# Patient Record
Sex: Female | Born: 1950 | Race: Black or African American | Hispanic: No | Marital: Married | Smoking: Never smoker
Health system: Southern US, Community
[De-identification: ages and names within clinical notes are randomized; demographics above are authoritative.]

## PROBLEM LIST (undated history)

## (undated) DIAGNOSIS — I10 Essential (primary) hypertension: Secondary | ICD-10-CM

## (undated) DIAGNOSIS — E049 Nontoxic goiter, unspecified: Secondary | ICD-10-CM

## (undated) DIAGNOSIS — N6310 Unspecified lump in the right breast, unspecified quadrant: Secondary | ICD-10-CM

## (undated) DIAGNOSIS — N189 Chronic kidney disease, unspecified: Secondary | ICD-10-CM

## (undated) DIAGNOSIS — M1990 Unspecified osteoarthritis, unspecified site: Secondary | ICD-10-CM

## (undated) DIAGNOSIS — H269 Unspecified cataract: Secondary | ICD-10-CM

## (undated) DIAGNOSIS — I639 Cerebral infarction, unspecified: Secondary | ICD-10-CM

## (undated) DIAGNOSIS — E28319 Asymptomatic premature menopause: Secondary | ICD-10-CM

## (undated) DIAGNOSIS — E039 Hypothyroidism, unspecified: Secondary | ICD-10-CM

## (undated) DIAGNOSIS — K219 Gastro-esophageal reflux disease without esophagitis: Secondary | ICD-10-CM

## (undated) DIAGNOSIS — E785 Hyperlipidemia, unspecified: Secondary | ICD-10-CM

## (undated) HISTORY — DX: Chronic kidney disease, unspecified: N18.9

## (undated) HISTORY — PX: OTHER SURGICAL HISTORY: SHX169

## (undated) HISTORY — DX: Hyperlipidemia, unspecified: E78.5

## (undated) HISTORY — DX: Unspecified cataract: H26.9

## (undated) HISTORY — DX: Gastro-esophageal reflux disease without esophagitis: K21.9

## (undated) HISTORY — DX: Asymptomatic premature menopause: E28.319

## (undated) HISTORY — PX: TUBAL LIGATION: SHX77

## (undated) HISTORY — DX: Nontoxic goiter, unspecified: E04.9

## (undated) HISTORY — DX: Essential (primary) hypertension: I10

## (undated) HISTORY — DX: Hypothyroidism, unspecified: E03.9

## (undated) HISTORY — PX: ESOPHAGOGASTRODUODENOSCOPY: SHX1529

## (undated) HISTORY — DX: Cerebral infarction, unspecified: I63.9

## (undated) HISTORY — DX: Unspecified osteoarthritis, unspecified site: M19.90

## (undated) NOTE — *Deleted (*Deleted)
[facility] CONSULT NOTE  Patient Care Team: Renford Dills, MD as PCP - General (Internal Medicine) Drema Dallas, DO as Consulting Physician (Neurology)  CHIEF COMPLAINTS/PURPOSE OF CONSULTATION:  Newly diagnosed high risk for breast cancer  HISTORY OF PRESENTING ILLNESS:  Ann Weiss 34 y.o. female is here because of recent diagnosis of high risk for breast cancer. Screening mammogram on 05/05/19 showed right breast calcifications. Diagnostic mammogram on 05/14/19 showed right breast calcifications, 0.8cm. 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 She underwent a right lumpectomy on 08/27/19 with Dr. Carolynne Edouard for which pathology showed hyalinized intraductal papilloma and usual duct epithelial hyperplasia. She presents to the clinic today for initial evaluation.   I reviewed her records extensively and collaborated the history with the patient.  SUMMARY OF ONCOLOGIC HISTORY: Oncology History   No history exists.    MEDICAL HISTORY:  Past Medical History:  Diagnosis Date  . Breast mass, right   . Constipation   . Diabetes mellitus   . GERD (gastroesophageal reflux disease)   . Goiter   . Hyperlipidemia   . Hypertension   . Hypothyroidism   . Premature menopause age 25  . Stroke (HCC)    sl weakness on right side-uses cane to walk  . Vitamin D deficiency 2009    SURGICAL HISTORY: Past Surgical History:  Procedure Laterality Date  . BREAST LUMPECTOMY WITH RADIOACTIVE SEED LOCALIZATION Right 08/27/2019   Procedure: RIGHT BREAST LUMPECTOMY WITH RADIOACTIVE SEED LOCALIZATION;  Surgeon: Griselda Miner, MD;  Location: [facility];  Service: General;  Laterality: Right;  . CESAREAN SECTION     x 2  . COLONOSCOPY  [id number]  . ESOPHAGOGASTRODUODENOSCOPY  [id number]  . FOOT SURGERY Right 01/2013   bone spur  . TUBAL LIGATION      SOCIAL HISTORY: Social History   Socioeconomic History  . Marital status:  Married    Spouse name: Dorene Sorrow  . Number of children: 2  . Years of education: Not on file  . Highest education level: Bachelor's degree (e.g., BA, AB, BS)  Occupational History  . Occupation: Retired from [employer]: RETIRED  Tobacco Use  . Smoking status: Never Smoker  . Smokeless tobacco: Never Used  Vaping Use  . Vaping Use: Never used  Substance and Sexual Activity  . Alcohol use: Yes    Comment: socially maybe 1-2 times monthly  . Drug use: No  . Sexual activity: Not Currently  Other Topics Concern  . Not on file  Social History Narrative    Daughter lives in [place] and other daughter lives in [place], [place]. 4 grandchildren      Patient is right-handed. She lives with her husband in a two level home. She drinks 2 cups of coffee a day, she does not exercise..   Social Determinants of Health   Financial Resource Strain:   . Difficulty of Paying Living Expenses: Not on file  Food Insecurity:   . Worried About Programme researcher, broadcasting/film/video in the Last Year: Not on file  . Ran Out of Food in the Last Year: Not on file  Transportation Needs:   . Lack of Transportation (Medical): Not on file  . Lack of Transportation (Non-Medical): Not on file  Physical Activity:   . Days of Exercise per Week: Not on file  . Minutes of Exercise per Session: Not on file  Stress:   . Feeling of Stress : Not on file  Social  Connections:   . Frequency of Communication with Friends and Family: Not on file  . Frequency of Social Gatherings with Friends and Family: Not on file  . Attends Religious Services: Not on file  . Active Member of Clubs or Organizations: Not on file  . Attends Banker Meetings: Not on file  . Marital Status: Not on file  Intimate Partner Violence:   . Fear of Current or Ex-Partner: Not on file  . Emotionally Abused: Not on file  . Physically Abused: Not on file  . Sexually Abused: Not on file    FAMILY HISTORY: Family History  Problem Relation Age of Onset  .  Hypertension Mother   . Kidney disease Mother        renal failure, dialysis  . Heart disease Mother   . Hypertension Father   . Stroke Father   . Heart disease Father   . Arthritis Maternal Aunt   . Heart disease Maternal Aunt   . Diabetes Maternal Grandmother   . Cancer Neg Hx   . Breast cancer Neg Hx   . Colon cancer Neg Hx     ALLERGIES:  is allergic to ciprofloxacin.  MEDICATIONS:  Current Outpatient Medications  Medication Sig Dispense Refill  . amLODipine (NORVASC) 10 MG tablet Take 1 tablet (10 mg total) by mouth daily. 30 tablet 5  . aspirin EC 81 MG tablet Take 81 mg by mouth daily.    Marland Kitchen BAYER CONTOUR TEST test strip TEST BLOOD SUGARS 3 TIMES DAILY 100 each 2  . hydrALAZINE (APRESOLINE) 50 MG tablet Take 50 mg by mouth 2 (two) times daily.     Marland Kitchen HYDROcodone-acetaminophen (NORCO/VICODIN) 5-325 MG tablet Take 1-2 tablets by mouth every 6 (six) hours as needed for moderate pain or severe pain. 10 tablet 0  . lactulose (CHRONULAC) 10 GM/15ML solution Take 10 g by mouth 2 (two) times daily.    Marland Kitchen levothyroxine (SYNTHROID, LEVOTHROID) 75 MCG tablet Take 75 mcg by mouth daily.  3  . metFORMIN (GLUCOPHAGE) 1000 MG tablet take 1 tablet by mouth twice a day with food 60 tablet 5  . omeprazole (PRILOSEC) 20 MG capsule take 1 capsule by mouth once daily (Patient taking differently: take 1 capsule by mouth as needed) 30 capsule 11  . rosuvastatin (CRESTOR) 40 MG tablet Take 1 tablet (40 mg total) by mouth daily. 90 tablet 1  . telmisartan-hydrochlorothiazide (MICARDIS HCT) 80-25 MG tablet Take 1 tablet by mouth daily.  6  . vitamin C (ASCORBIC ACID) 250 MG tablet Take 250 mg by mouth daily.     No current facility-administered medications for this visit.    REVIEW OF SYSTEMS:   Constitutional: Denies fevers, chills or abnormal night sweats Eyes: Denies blurriness of vision, double vision or watery eyes Ears, nose, mouth, throat, and face: Denies mucositis or sore throat  Respiratory: Denies cough, dyspnea or wheezes Cardiovascular: Denies palpitation, chest discomfort or lower extremity swelling Gastrointestinal:  Denies nausea, heartburn or change in bowel habits Skin: Denies abnormal skin rashes Lymphatics: Denies new lymphadenopathy or easy bruising Neurological:Denies numbness, tingling or new weaknesses Behavioral/Psych: Mood is stable, no new changes  Breast: Denies any palpable lumps or discharge All other systems were reviewed with the patient and are negative.  PHYSICAL EXAMINATION: ECOG PERFORMANCE STATUS: {CHL ONC ECOG [phone]}  There were no vitals filed for this visit. There were no vitals filed for this visit.  GENERAL:alert, no distress and comfortable SKIN: skin color, texture, turgor are normal, no  rashes or significant lesions EYES: normal, conjunctiva are pink and non-injected, sclera clear OROPHARYNX:no exudate, no erythema and lips, buccal mucosa, and tongue normal  NECK: supple, thyroid normal size, non-tender, without nodularity LYMPH:  no palpable lymphadenopathy in the cervical, axillary or inguinal LUNGS: clear to auscultation and percussion with normal breathing effort HEART: regular rate & rhythm and no murmurs and no lower extremity edema ABDOMEN:abdomen soft, non-tender and normal bowel sounds Musculoskeletal:no cyanosis of digits and no clubbing  PSYCH: alert & oriented x 3 with fluent speech NEURO: no focal motor/sensory deficits BREAST:*** No palpable nodules in breast. No palpable axillary or supraclavicular lymphadenopathy (exam performed in the presence of a chaperone)   LABORATORY DATA:  I have reviewed the data as listed Lab Results  Component Value Date   WBC 9.2 04/01/2019   HGB 13.4 04/01/2019   HCT 41.7 04/01/2019   MCV 91.4 04/01/2019   PLT 198 04/01/2019   Lab Results  Component Value Date   NA 138 08/24/2019   K 3.9 08/24/2019   CL 101 08/24/2019   CO2 27 08/24/2019    RADIOGRAPHIC  STUDIES: I have personally reviewed the radiological reports and agreed with the findings in the report.  ASSESSMENT AND PLAN:  No problem-specific Assessment & Plan notes found for this encounter.   All questions were answered. The patient knows to call the clinic with any problems, questions or concerns.   Sabas Sous, MD, MPH 10/17/2019    I, Molly Dorshimer, am acting as scribe for Serena Croissant, MD.  {Add scribe attestation statement}

---

## 1997-06-29 ENCOUNTER — Ambulatory Visit (HOSPITAL_COMMUNITY): Admission: RE | Admit: 1997-06-29 | Discharge: 1997-06-29 | Payer: Self-pay | Admitting: Endocrinology

## 1998-03-17 ENCOUNTER — Ambulatory Visit (HOSPITAL_COMMUNITY): Admission: RE | Admit: 1998-03-17 | Discharge: 1998-03-17 | Payer: Self-pay | Admitting: *Deleted

## 1998-11-15 ENCOUNTER — Encounter: Payer: Self-pay | Admitting: Endocrinology

## 1998-11-15 ENCOUNTER — Ambulatory Visit (HOSPITAL_COMMUNITY): Admission: RE | Admit: 1998-11-15 | Discharge: 1998-11-15 | Payer: Self-pay | Admitting: Endocrinology

## 1999-05-10 ENCOUNTER — Encounter: Admission: RE | Admit: 1999-05-10 | Discharge: 1999-05-10 | Payer: Self-pay | Admitting: Endocrinology

## 1999-05-10 ENCOUNTER — Encounter: Payer: Self-pay | Admitting: Endocrinology

## 1999-11-12 ENCOUNTER — Inpatient Hospital Stay (HOSPITAL_COMMUNITY): Admission: AD | Admit: 1999-11-12 | Discharge: 1999-11-12 | Payer: Self-pay | Admitting: Obstetrics and Gynecology

## 2000-10-24 ENCOUNTER — Encounter: Admission: RE | Admit: 2000-10-24 | Discharge: 2000-10-24 | Payer: Self-pay | Admitting: Endocrinology

## 2000-10-24 ENCOUNTER — Encounter: Payer: Self-pay | Admitting: Endocrinology

## 2000-11-03 ENCOUNTER — Other Ambulatory Visit: Admission: RE | Admit: 2000-11-03 | Discharge: 2000-11-03 | Payer: Self-pay | Admitting: *Deleted

## 2001-08-04 ENCOUNTER — Encounter: Payer: Self-pay | Admitting: Endocrinology

## 2001-08-04 ENCOUNTER — Encounter: Admission: RE | Admit: 2001-08-04 | Discharge: 2001-08-04 | Payer: Self-pay | Admitting: Endocrinology

## 2001-09-08 ENCOUNTER — Ambulatory Visit (HOSPITAL_COMMUNITY): Admission: RE | Admit: 2001-09-08 | Discharge: 2001-09-08 | Payer: Self-pay | Admitting: Neurology

## 2001-09-18 ENCOUNTER — Ambulatory Visit (HOSPITAL_COMMUNITY): Admission: RE | Admit: 2001-09-18 | Discharge: 2001-09-18 | Payer: Self-pay | Admitting: Neurology

## 2001-09-18 ENCOUNTER — Encounter: Payer: Self-pay | Admitting: Neurology

## 2002-01-13 ENCOUNTER — Other Ambulatory Visit: Admission: RE | Admit: 2002-01-13 | Discharge: 2002-01-13 | Payer: Self-pay | Admitting: *Deleted

## 2002-02-09 ENCOUNTER — Encounter: Payer: Self-pay | Admitting: Neurology

## 2002-02-09 ENCOUNTER — Ambulatory Visit (HOSPITAL_COMMUNITY): Admission: RE | Admit: 2002-02-09 | Discharge: 2002-02-09 | Payer: Self-pay | Admitting: Neurology

## 2002-02-09 IMAGING — MR MR HEAD WO/W CM
6 of 11 series · 23 of 48 positions shown · IV contrast (agent unspecified)
Comparison: none

FINDINGS
CLINICAL DATA: RIGHT HEMIPARESIS.  PREVIOUSLY IDENTIFIED LEFT POSTERIOR PARASAGITTAL BRAIN LESION.
MR BRAIN WITHOUT AND WITH CONTRAST
MULTIPLANAR T1 AND T2 WEIGHTED IMAGES WERE OBTAINED WITH AND WITHOUT CONTRAST.  SAGITTAL T1
WEIGHTED IMAGES ARE UNREMARKABLE.  AXIAL T2 WEIGHTED IMAGES DEMONSTRATE NORMAL VENTRICLES, CISTERNS
AND SULCI.  T2 AND FLAIR IMAGES DEMONSTRATE A 1.0 X 1.5 X 1.0 CM LESION IN THE LEFT PARIETAL
PARASAGITTAL CORTEX INVOLVING THE LEFT FORCEPS MAJOR.  OTHER MORE SUBTLE PERIVENTRICULAR WHITE
MATTER LESIONS ARE SEEN BILATERALLY IN THE OPTIC RADIATIONS ALONG THE OCCIPITAL HORNS.  T1 WEIGHTED
IMAGES ARE UNREMARKABLE.  SIGNIFICANT MAXILLARY AND ETHMOID SINUSITIS IS INCIDENTALLY NOTED WITH
MUCOSAL THICKENING IN BOTH FRONTAL SINUSES.  DIFFUSION WEIGHTED IMAGES SHOW ABNORMAL SIGNAL,
CORRESPONDING TO THE LESION.  ON DIFFUSION IMAGES, THIS LARGE AREA OF HYPERINTENSITY ON FLAIR
IMAGES APPEARS AS SEVERAL SMALLER FOCI OF POSITIVITY.  FOLLOWING ADMINISTRATION OF CONTRAST, THERE
IS NO ABNORMAL ENHANCEMENT.
WHEN COMPARED WITH PREVIOUS FLAIR IMAGES FROM 18 September, 2001, OVERALL THE LESION HAS DECREASED
SLIGHTLY IN SIZE.  I DO NOT HAVE OTHER IMAGES SUCH AS DIFFUSION OR POST CONTRAST IMAGES FOR
COMPARISON.
CONSIDERING THE INTERVAL IMPROVEMENT, I BELIEVE THE LESION IS MOST LIKELY TO REPRESENT AN ACTIVE MS
PLAQUE.  GIVEN THE PREVIOUSLY NORMAL SPECTROSCOPY, IT IS UNLIKELY THAT A BENIGN NEOPLASM WOULD SHOW
INTERVAL CHANGE IN 3 MONTHS IN REGARD TO IMPROVEMENT.  THE FACT THAT THE DIFFUSION IMAGES ARE
POSITIVE IN AN AREA CORRESPONDING TO THE LESION WOULD INDICATE THAT THIS DOES NOT REPRESENT A
STROKE, BUT IT IS KNOWN THAT ACTIVE MS LESIONS WILL SHOW INCREASED ACTIVITY ON DIFFUSION.
IMPRESSION
1.  OVERALL IMPROVEMENT IN LEFT PARIETAL PARASAGITTAL LESION INVOLVING THE LEFT FORCEPS MAJOR OF
THE CORPUS CALLOSUM NOW MEASURING 1.0 X 1.0 X 1.5 CM.
2.  THE LESION SHOWS FOCAL AREAS OF POSITIVITY ON DIFFUSION WEIGHTED IMAGES, I AM SUSPICIOUS THAT
THIS REPRESENTS AN ACUTE MS PLAQUE.
3.  MILD INCREASED SIGNAL IN THE PERIVENTRICULAR WHITE MATTER ALONG THE OPTIC RADIATIONS ALSO
SUGGESTS MS PAWLO BE THE ETIOLOGY.
MR SPECTROSCOPY
A PROBE P TECHNIQUE WAS USED WITH A LONG TR AND A SHORT TE.  A 9 VOXEL GRID WAS PLACED OVER THE
LEFT HEMISPHERE TO INCLUDE AREAS OF NORMAL TISSUE AS WELL AS THE LESION.  FURTHERMORE, A SEPARATE 2
CM VOXEL WAS PLACED TO INCLUDE THE LESION IN ITS EPICENTER.  THE SPECTRUM FROM NORMAL BRAIN AND
FROM THE LESION ARE VIRTUALLY IDENTICAL.  I DO NOT FEEL THAT THERE IS ANY DESTRUCTION OF LORRAINE OR ANY
INCREASE IN CHOLINE REFERRABLE TO THE LESION.  WHEN COMPARED TO PREVIOUS SPECTRUM [DATE], THERE IS LITTLE CHANGE.
SPECTRUM FROM THE LEFT PARIETAL PARASAGITTAL LESION IS WITHIN NORMAL LIMITS AND UNCHANGED FROM
PREVIOUS STUDY.

[Series 2: T1 · sagittal · 5.0mm · 0.43mm/px · 1 of 12 slices shown]
[im 1/12]
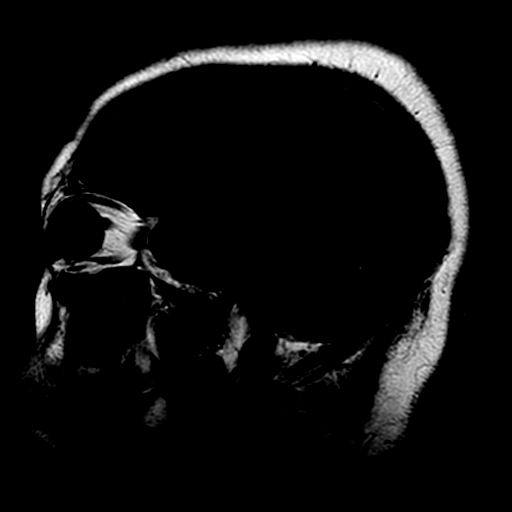

[Series 3: DWI · axial · 5.0mm · 1.25mm/px · z∈[-45,+95]mm · 8 of 42 slices shown]
[im 1/42]
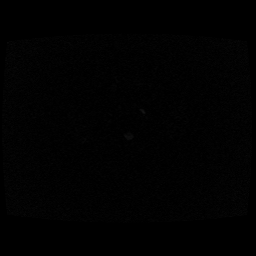
[im 6/42]
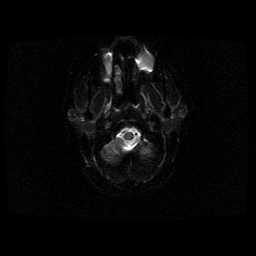
[im 12/42]
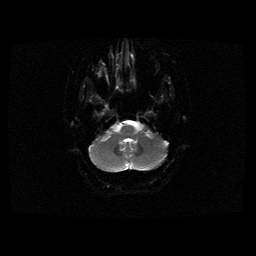
[im 18/42]
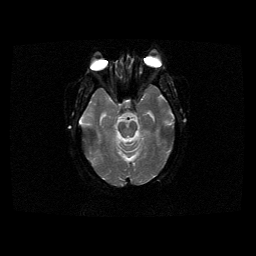
[im 24/42]
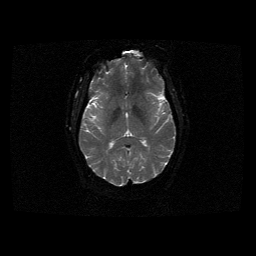
[im 30/42]
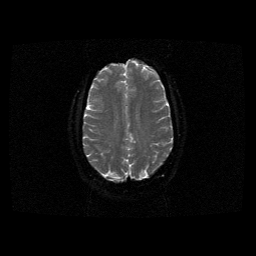
[im 36/42]
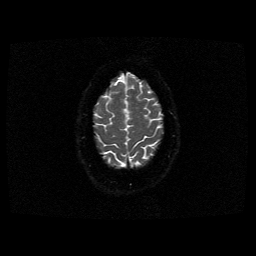
[im 42/42]
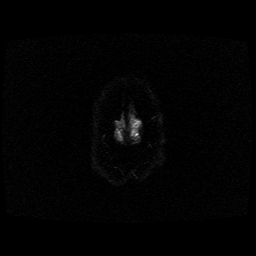

[Series 4: T2 · axial · 5.0mm · 0.43mm/px · z∈[-40,+93]mm · 4 of 20 slices shown (1 of 2)]
[im 1/20]
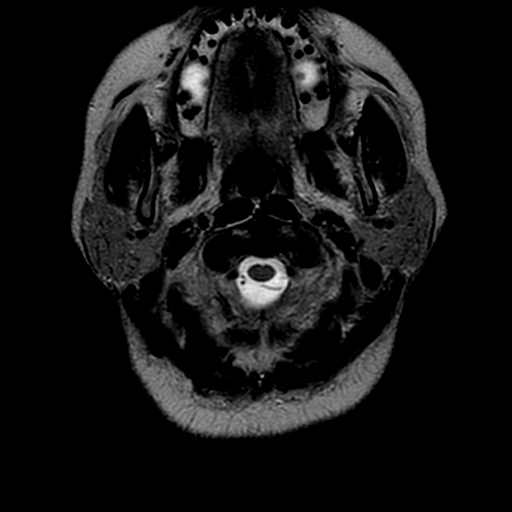
[im 7/20]
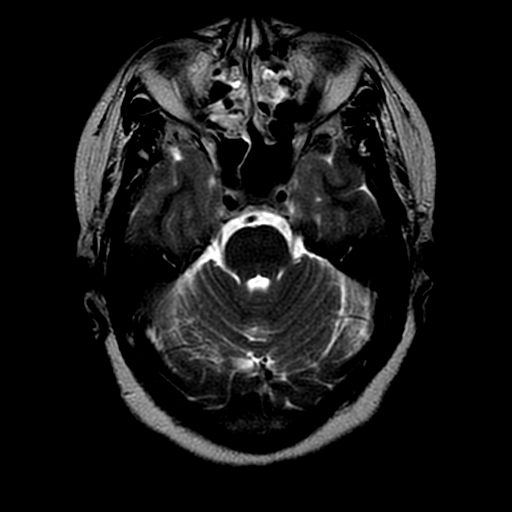
[im 13/20]
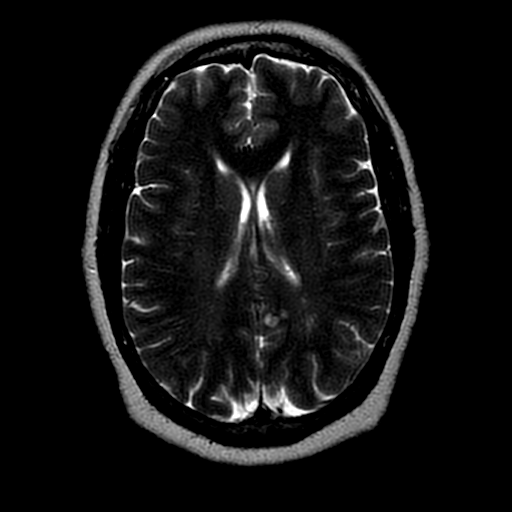
[im 20/20]
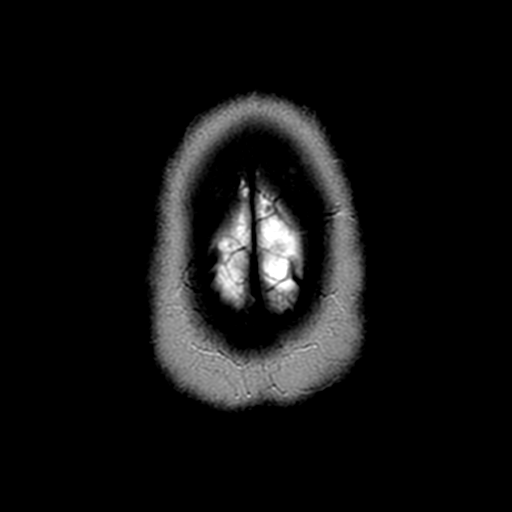

[Series 5: FLAIR · axial · 5.0mm · 0.43mm/px · z∈[-40,+93]mm · 4 of 20 slices shown]
[im 1/20]
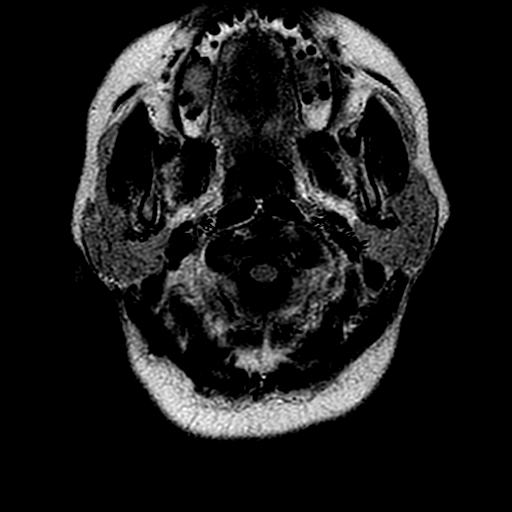
[im 7/20]
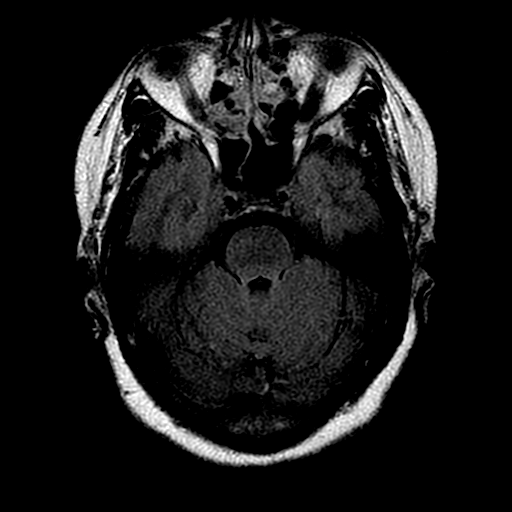
[im 13/20]
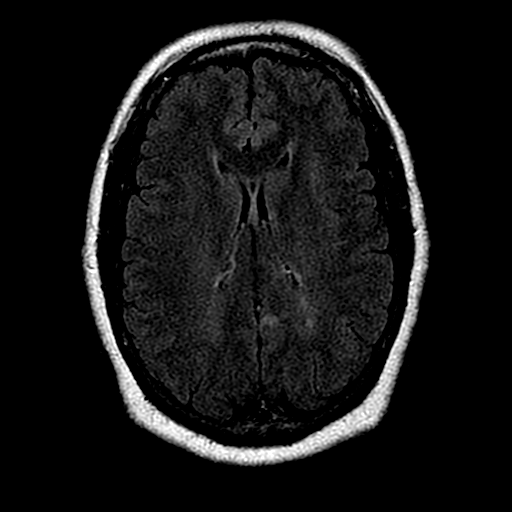
[im 20/20]
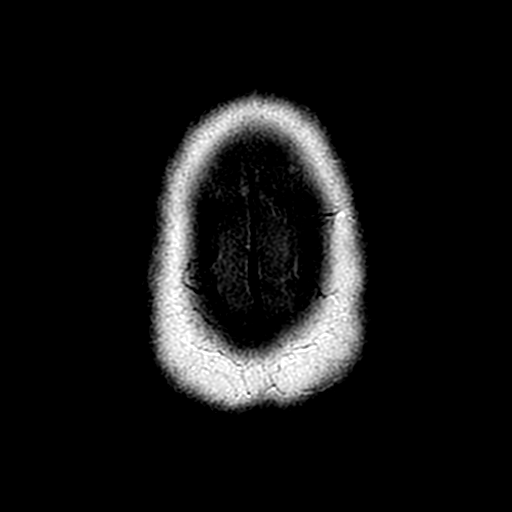

[Series 7: T2 · coronal · 5.0mm · 0.43mm/px · 4 of 23 slices shown (2 of 2)]
[im 1/23]
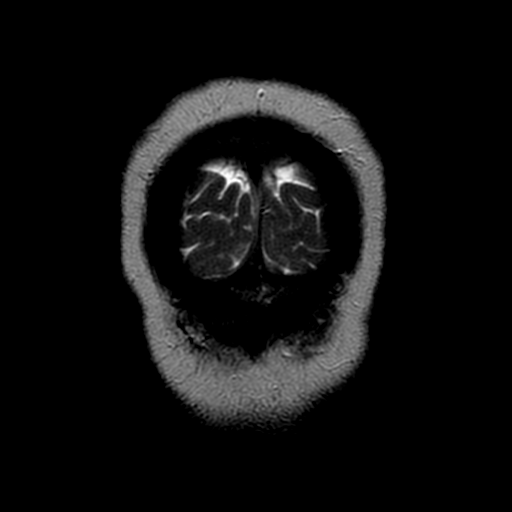
[im 8/23]
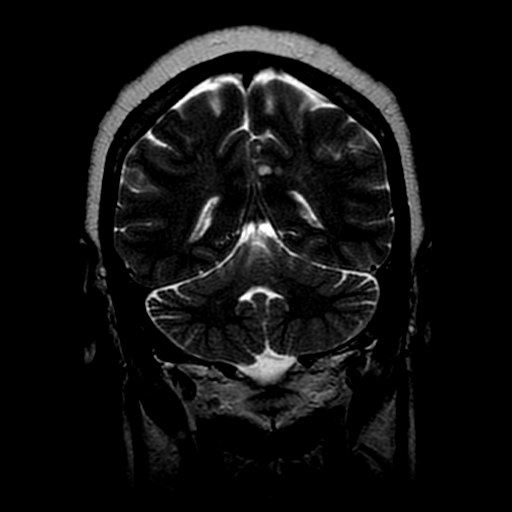
[im 15/23]
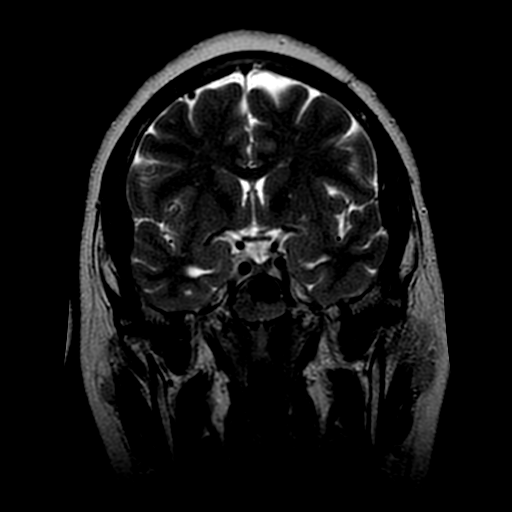
[im 23/23]
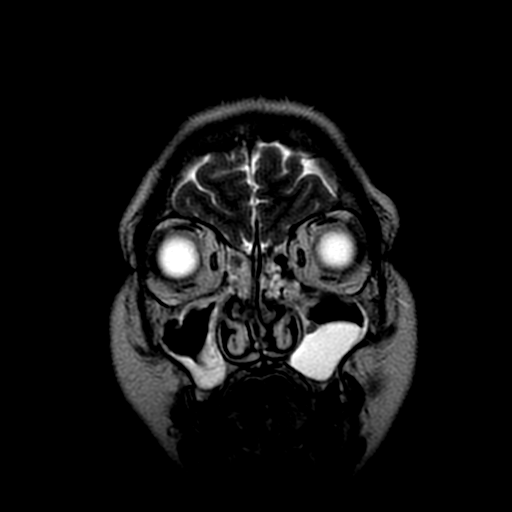

[Series 14: T1 post-contrast · sagittal · 3.0mm · 0.43mm/px · 2 of 12 slices shown]
[im 1/12]
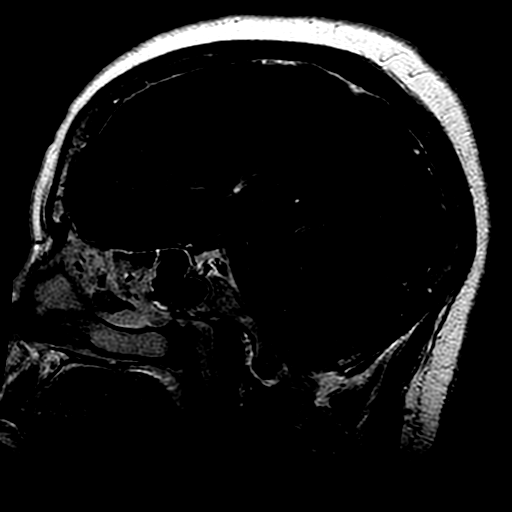
[im 12/12]
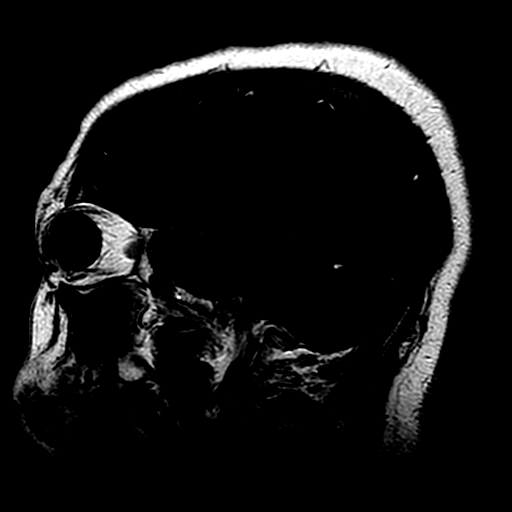

[23 of 48 positions shown; findings below may reference images not displayed]

## 2002-03-23 ENCOUNTER — Encounter: Payer: Self-pay | Admitting: Endocrinology

## 2002-03-23 ENCOUNTER — Ambulatory Visit (HOSPITAL_COMMUNITY): Admission: RE | Admit: 2002-03-23 | Discharge: 2002-03-23 | Payer: Self-pay | Admitting: Endocrinology

## 2002-06-15 ENCOUNTER — Encounter: Payer: Self-pay | Admitting: Endocrinology

## 2002-06-15 ENCOUNTER — Encounter: Admission: RE | Admit: 2002-06-15 | Discharge: 2002-06-15 | Payer: Self-pay | Admitting: Endocrinology

## 2003-03-22 ENCOUNTER — Other Ambulatory Visit: Admission: RE | Admit: 2003-03-22 | Discharge: 2003-03-22 | Payer: Self-pay | Admitting: *Deleted

## 2003-03-26 ENCOUNTER — Emergency Department (HOSPITAL_COMMUNITY): Admission: EM | Admit: 2003-03-26 | Discharge: 2003-03-26 | Payer: Self-pay | Admitting: Emergency Medicine

## 2003-03-26 IMAGING — CR DG CHEST 2V
2 series · 2 of 2 positions shown · non-contrast
Comparison: none

CLINICAL DATA: Pressure in chest.
 CHEST TWO VIEW
 The heart size and mediastinal contours are unremarkable.  The lungs are clear.  The visualized skeleton is unremarkable.
 IMPRESSION
 No active disease.

[view not recorded (1 of 2)]
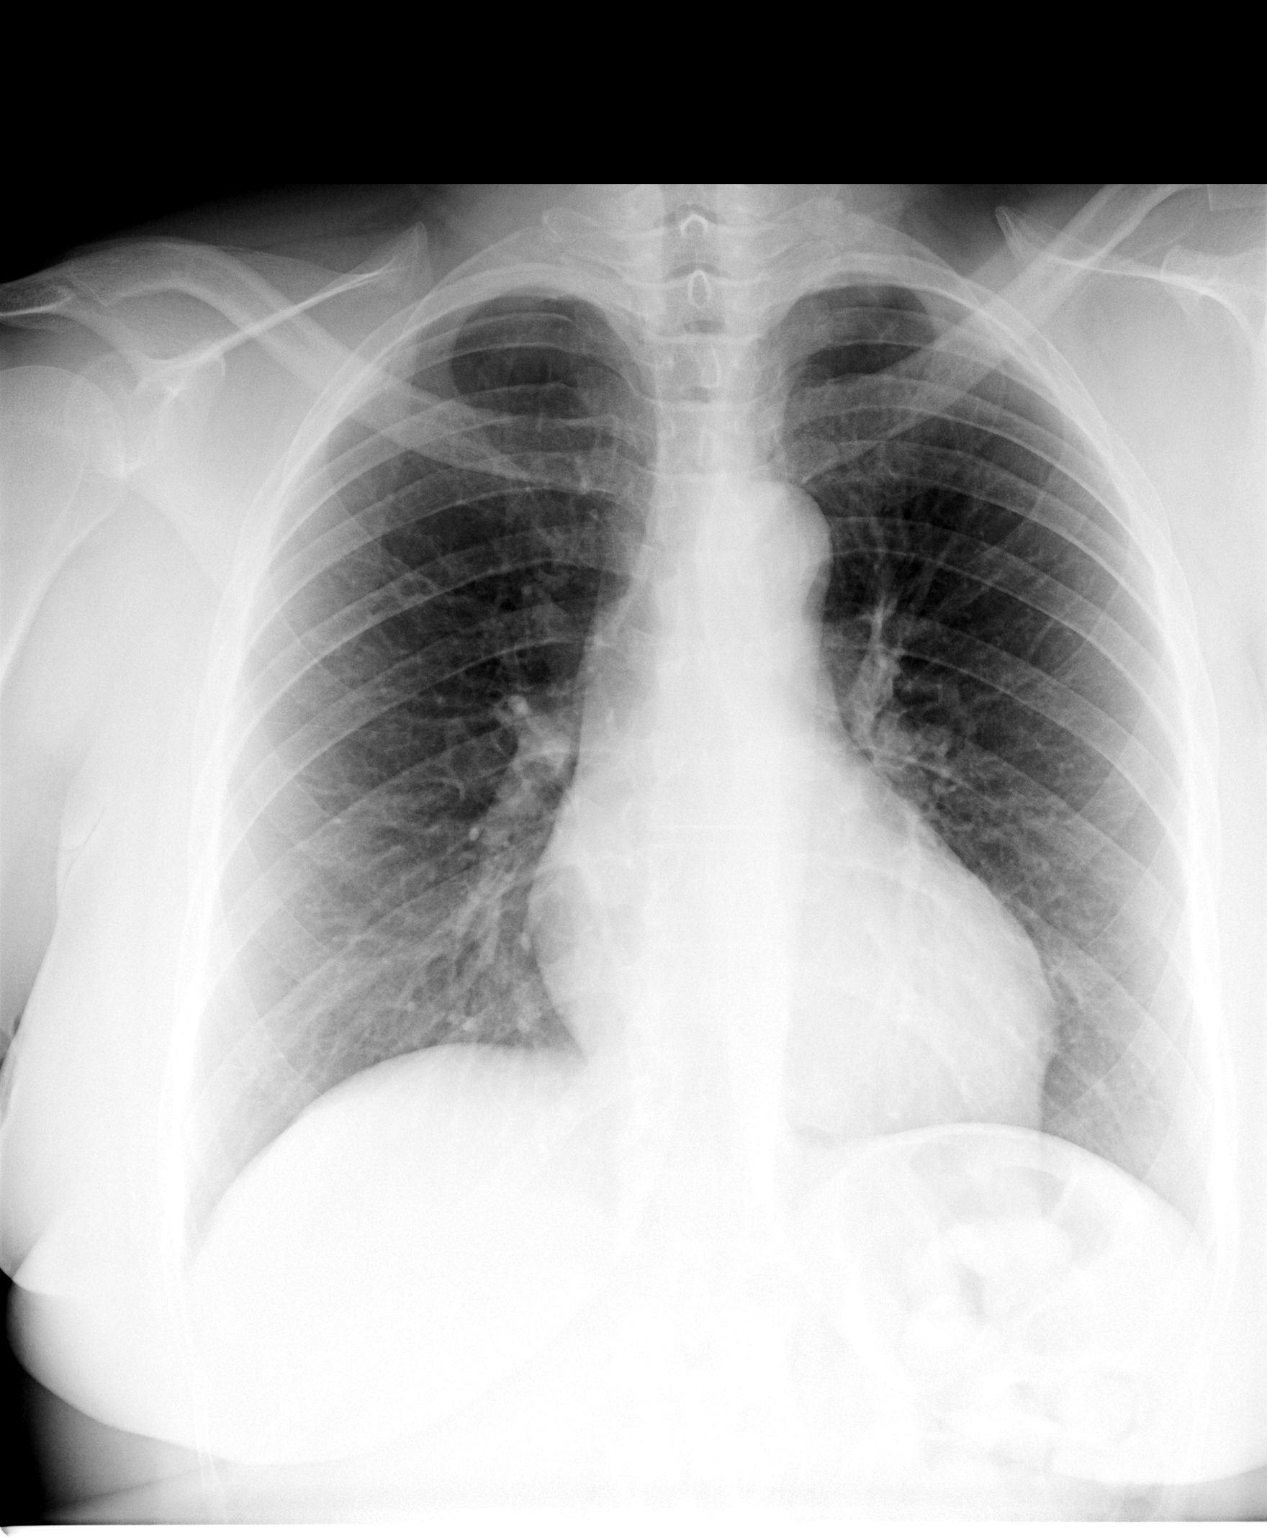

[view not recorded (2 of 2)]
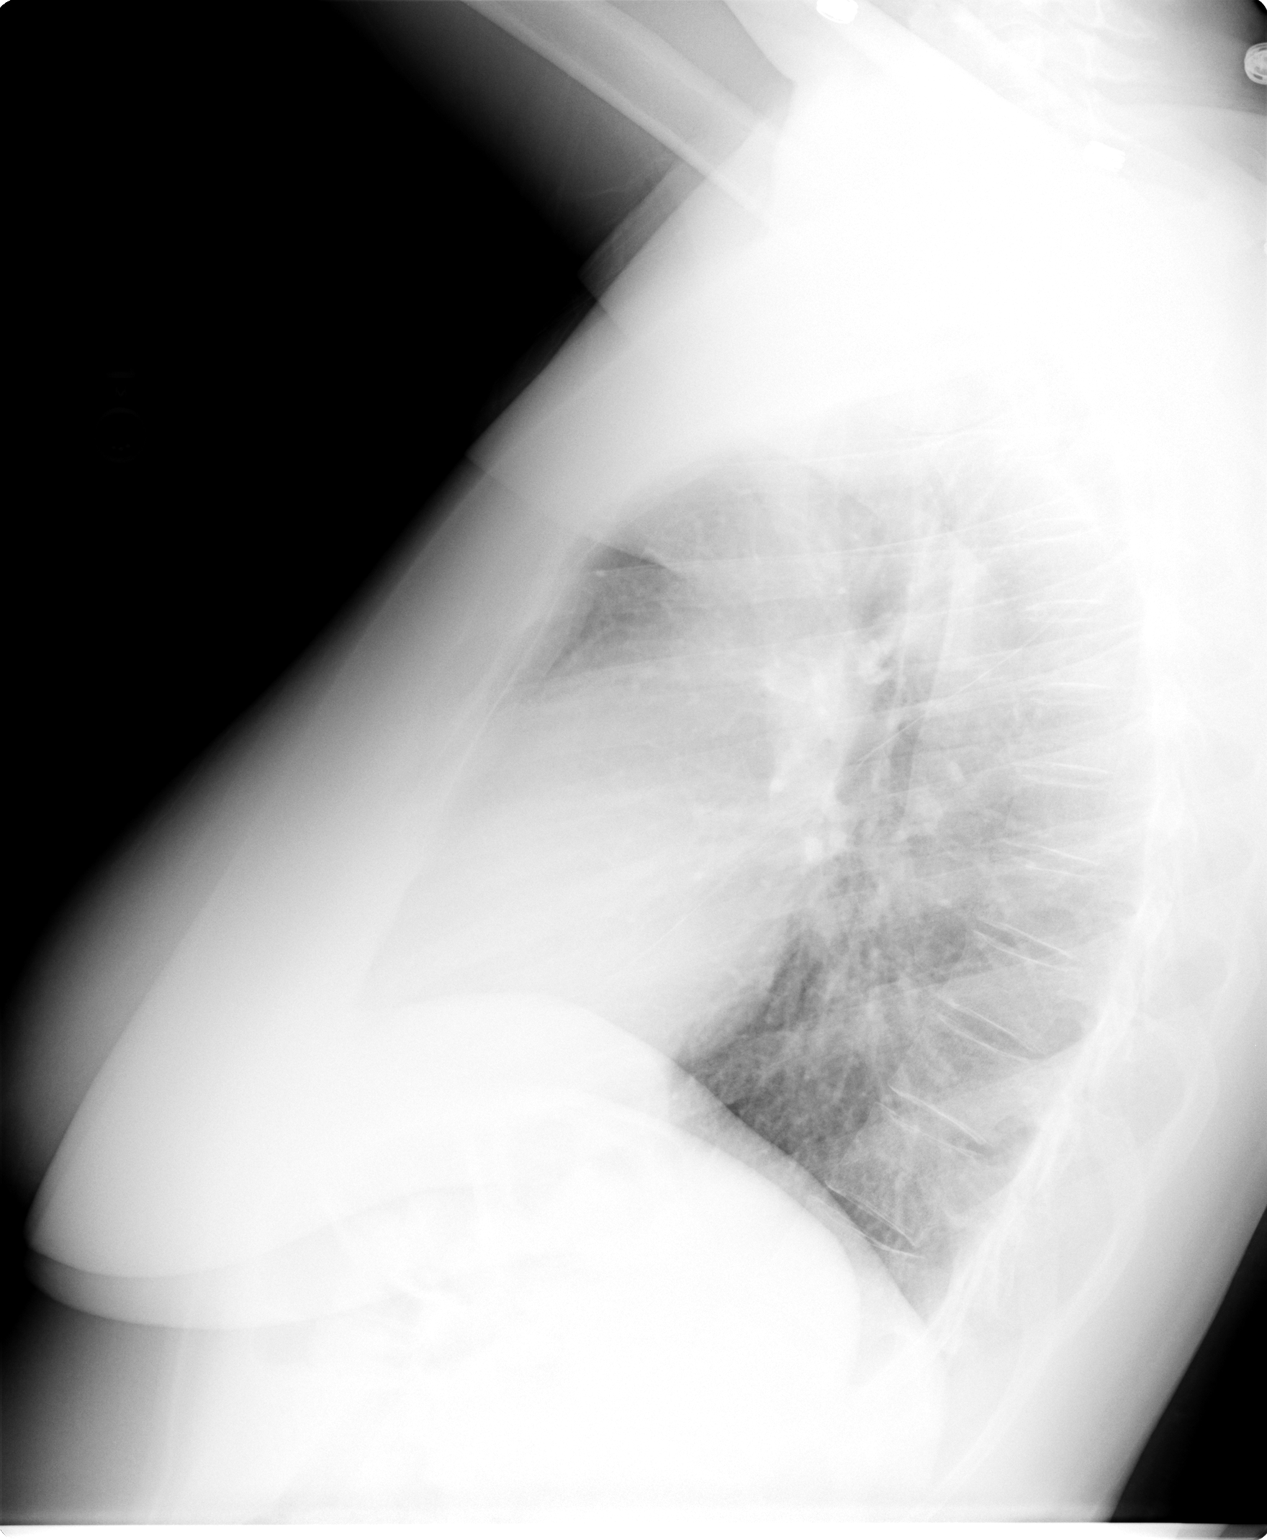

[2 of 2 positions shown; findings below may reference images not displayed]

## 2003-04-19 ENCOUNTER — Ambulatory Visit (HOSPITAL_COMMUNITY): Admission: RE | Admit: 2003-04-19 | Discharge: 2003-04-19 | Payer: Self-pay | Admitting: Cardiology

## 2003-04-19 IMAGING — NM NM MYOCAR SINGLE W/ SPECT
1 series · 6 of 6 positions shown · non-contrast
Comparison: none

CLINICAL DATA: Chest pain
 NUCLEAR MEDICINE MYOCARDIAL PERFUSION IMAGING WITH SPECT

[Series 1: cr cardiolite low dose · 6.79mm/px · 6 of 64 frames shown]
[frame 6/64]
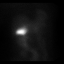
[frame 16/64]
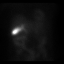
[frame 27/64]
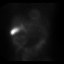
[frame 38/64]
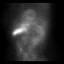
[frame 48/64]
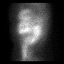
[frame 59/64]
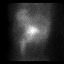

[6 of 6 positions shown; findings below may reference images not displayed]

FINDINGS: After the intravenous injection of 10 millicuries technetium 99m sestamibi, myocardial perfusion rest images were obtained.  The patient refused stress imaging.  Apparently the patient was claustrophobic.
 Myocardial perfusion imaging with SPECT at rest. A perfusion defect is present at the apex of the heart.  No other perfusion defects are seen. The images were non-gated.
 IMPRESSION 
 Rest perfusion images only demonstrating a small perfusion defect at the cardiac apex.

## 2003-07-12 ENCOUNTER — Ambulatory Visit (HOSPITAL_COMMUNITY): Admission: RE | Admit: 2003-07-12 | Discharge: 2003-07-12 | Payer: Self-pay | Admitting: Neurology

## 2003-07-26 ENCOUNTER — Ambulatory Visit (HOSPITAL_COMMUNITY): Admission: RE | Admit: 2003-07-26 | Discharge: 2003-07-26 | Payer: Self-pay | Admitting: Neurology

## 2003-07-26 IMAGING — MR MR HEAD WO/W CM
6 of 11 series · 24 of 48 positions shown · IV contrast (gadolinium)
Comparison: none

CLINICAL DATA: Patient with right-sided hemiparesis with left posterior parietal parasagittal lesions. 
MRI SCAN OF THE BRAIN PRE- AND POST-INFUSION OF CONTRAST 
Multiecho, multiplanar images were obtained before and after the infusion of 20 cc of gadolinium intravenously.  
The study is read in conjunction with the previous MRI scan of the brain of 02/09/02. 
The gray white matter differentiation remains normal for the patient?s age.  The sella and the craniovertebral junction are within normal limits as well.
The odontoid process and the predental space are normal.  There is mild prominence of the soft tissues of the nasopharynx, probably related to the patient?s age. 
The diffusion-weighted images demonstrate three focal areas of hyperintensity in the deep left parieto-occipital region just posterior to the posterior aspect of the splenium of the corpus callosum and the region of the forceps major.  
The axial FLAIR and axial T2-weighted images correspond with hyperintense signal in these areas of abnormality in the deep left parieto-occipital subcortical white matter posterior to the splenium of the corpus callosum.  Post-infusion images again demonstrate no evidence of pathological enhancement. 
On the sagittal FLAIR sequences, three oval lesions are seen, the largest of this anteriorly measuring 14.8 mm x 10.3 mm, and the others measuring 8 mm x 7 mm and the smaller one in the middle measuring approximately 5 mm x 5 mm. Additionally demonstrated are ill defined areas of increased signal in the regions of the optic radiations bilaterally, in the trigonal white matter regions, and also in the subcortically white matter bifrontally in the parasagittal axis.  None of these areas demonstrate incident.
Flow voids are maintained in the major vessels of the cranial skull base.  The mastoid air cells are normally developed and aerated.  
Again demonstrated is moderate sinusitis changes involving the ethmoid air cells.  There is moderate thickening of the mucosa in the maxillary sinuses with suggestion of possible mucous retention cyst in the left maxillary sinus and a small one in the right maxillary sinus. 
Post-infusion images demonstrate the dural venous sinuses to be patent. 
IMPRESSION
Three well defined areas of hyperintensity involving the subcortical white matter including  the left forceps major of the corpus callosum.  None of these show enhancement.  Compared to the previous study, with slight differences in technique, the lesion morphology and size remain stable. 
Continued presence of more ill defined areas of abnormal hyperintense signal involving the bifrontal subcortical white matter in the parasagittal axis, and also the optic radiations.  These combined with the lesions described, would lend suspicion to these being related to a demyelinating illness which is possibly inactive at the present time. 
Sinusitis-type changes involving the ethmoids, the maxillary sinuses, and to a lesser degree the sphenoid sinuses, slightly improved. 
Probable mucous retention cyst in both maxillary sinuses as described above.

[Series 3: DWI · axial · 5.0mm · 1.41mm/px · z∈[-41,+95]mm · 9 of 52 slices shown]
[im 1/52]
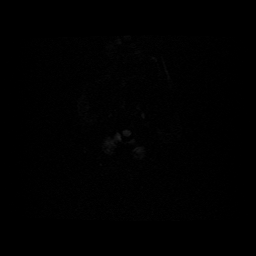
[im 7/52]
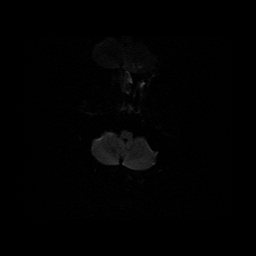
[im 13/52]
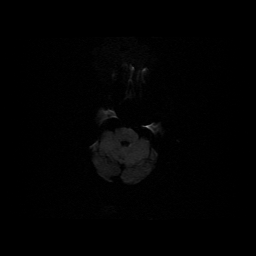
[im 20/52]
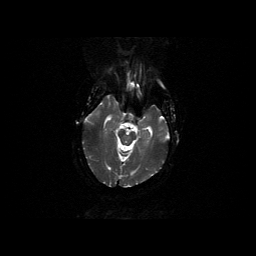
[im 26/52]
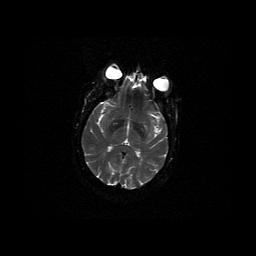
[im 32/52]
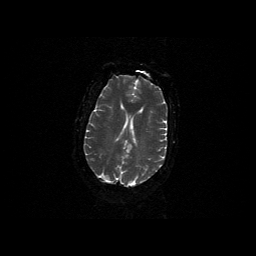
[im 39/52]
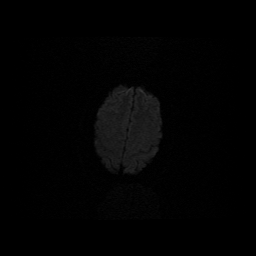
[im 45/52]
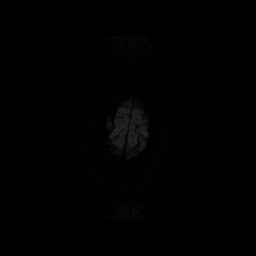
[im 52/52]
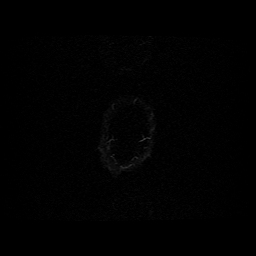

[Series 4: T2 · axial · 5.0mm · 0.43mm/px · z∈[-48,+91]mm · 3 of 21 slices shown (1 of 2)]
[im 1/21]
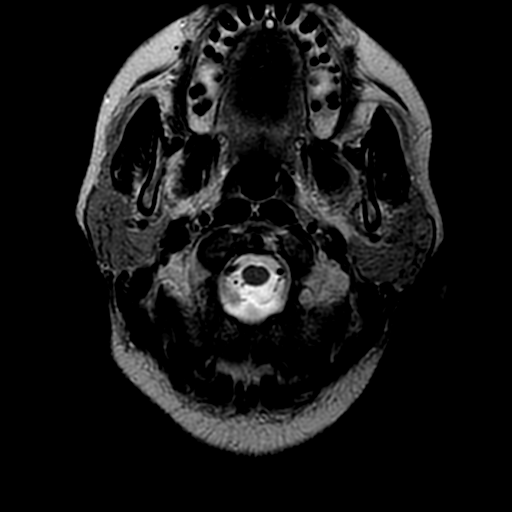
[im 11/21]
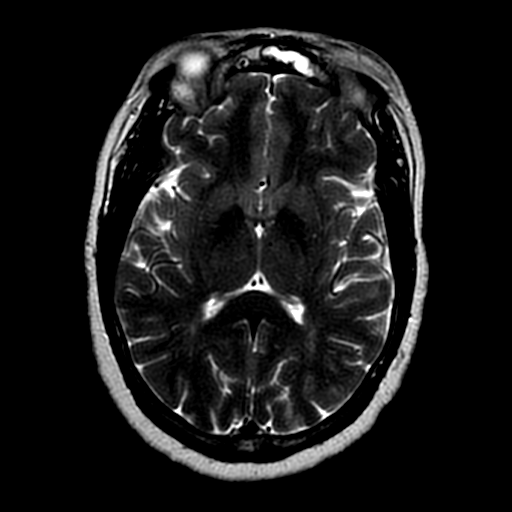
[im 21/21]
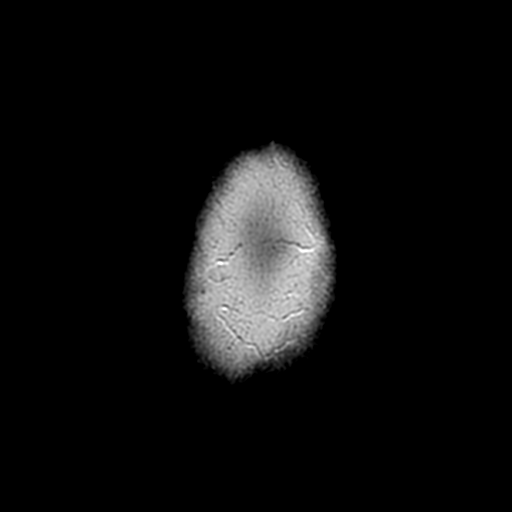

[Series 5: FLAIR · axial · 5.0mm · 0.43mm/px · z∈[-48,+91]mm · 3 of 21 slices shown (1 of 2)]
[im 1/21]
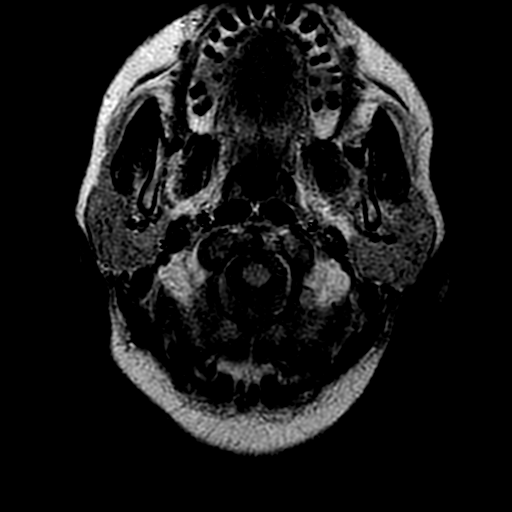
[im 11/21]
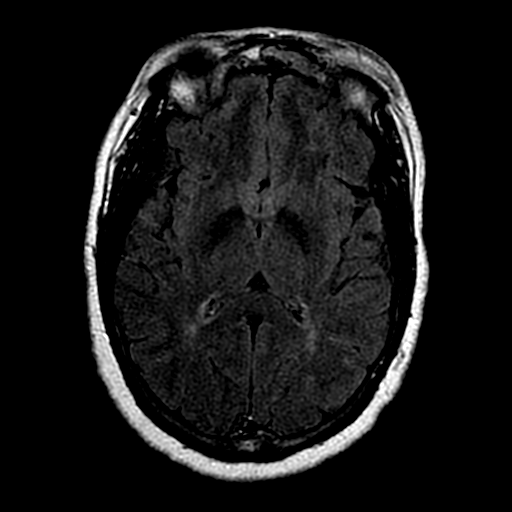
[im 21/21]
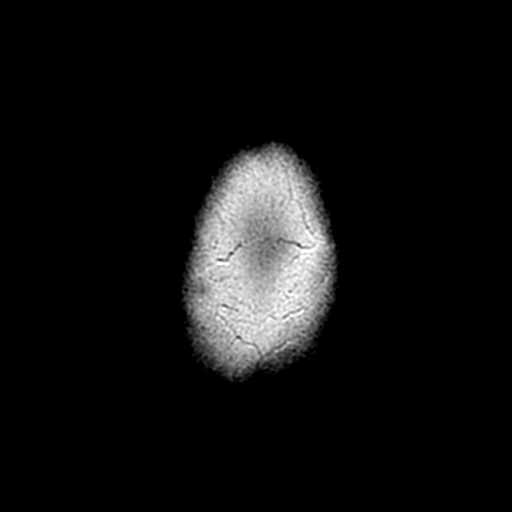

[Series 6: FLAIR · sagittal · 5.0mm · 0.43mm/px · 3 of 19 slices shown (2 of 2)]
[im 1/19]
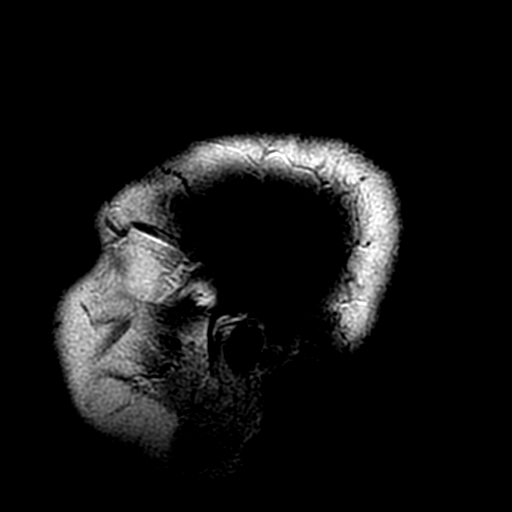
[im 10/19]
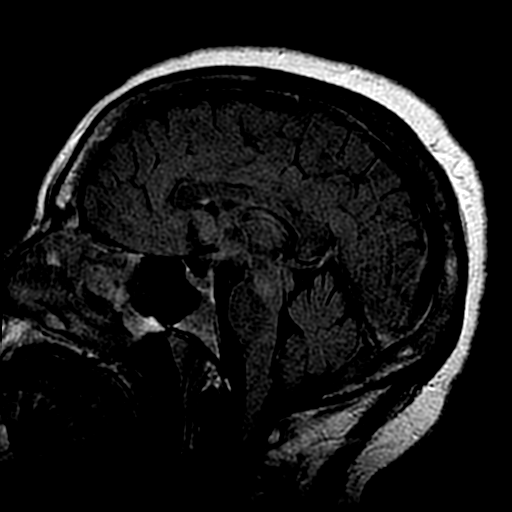
[im 19/19]
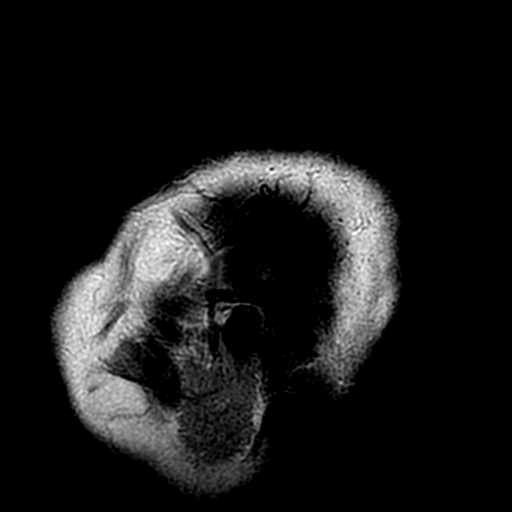

[Series 8: T2 · coronal · 5.0mm · 0.43mm/px · 3 of 20 slices shown (2 of 2)]
[im 1/20]
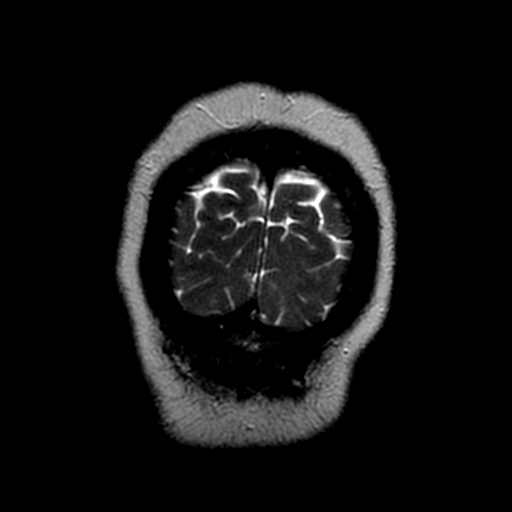
[im 10/20]
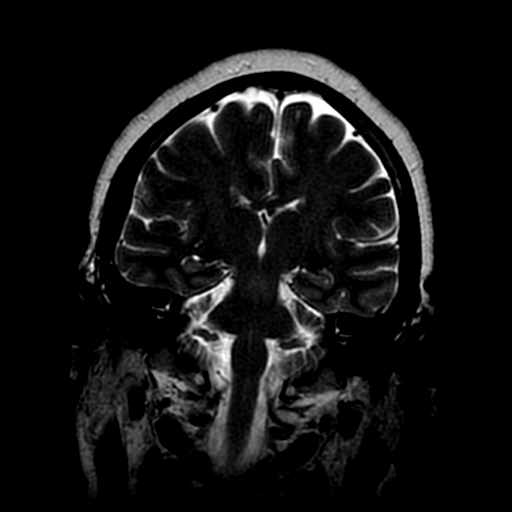
[im 20/20]
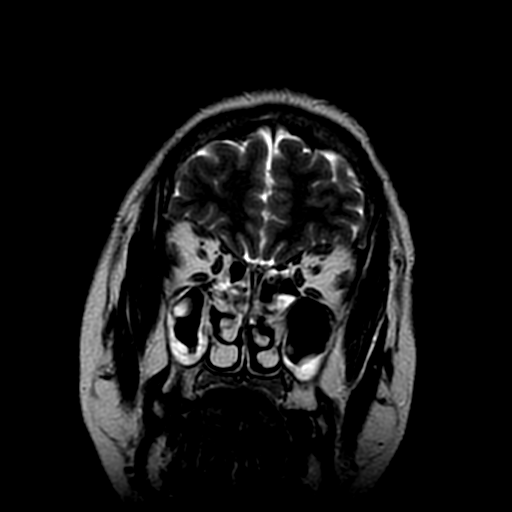

[Series 11: T1 post-contrast · sagittal · 5.0mm · 0.43mm/px · 3 of 21 slices shown]
[im 1/21]
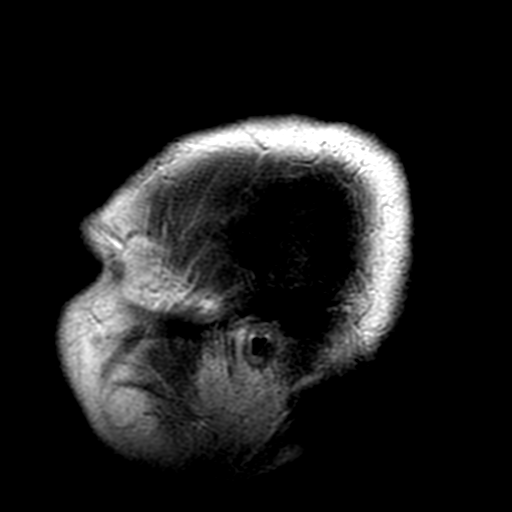
[im 11/21]
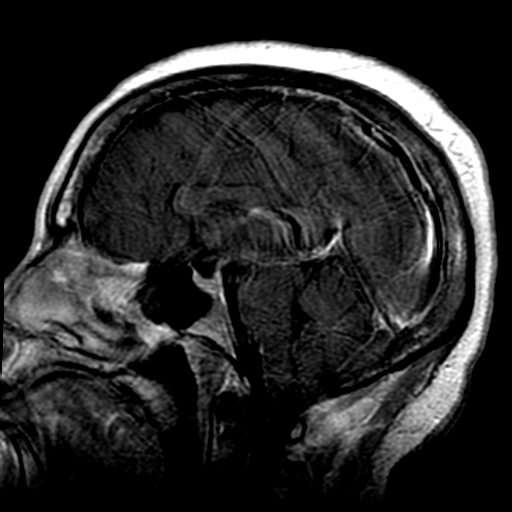
[im 21/21]
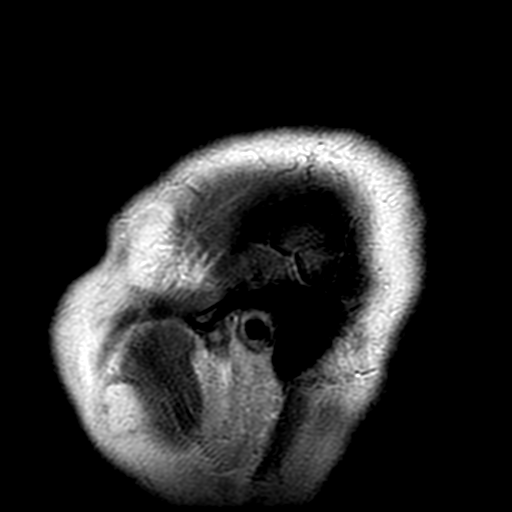

[24 of 48 positions shown; findings below may reference images not displayed]

## 2004-04-17 ENCOUNTER — Other Ambulatory Visit: Admission: RE | Admit: 2004-04-17 | Discharge: 2004-04-17 | Payer: Self-pay | Admitting: Obstetrics and Gynecology

## 2004-08-21 ENCOUNTER — Ambulatory Visit (HOSPITAL_COMMUNITY): Admission: RE | Admit: 2004-08-21 | Discharge: 2004-08-21 | Payer: Self-pay | Admitting: Endocrinology

## 2004-08-21 IMAGING — US US SOFT TISSUE HEAD/NECK
1 series · 14 of 25 positions shown · non-contrast
Comparison: 03/23/2002.

CLINICAL DATA: Followup multinodular goiter.

THYROID ULTRASOUND
TECHNIQUE: Ultrasound examination of the thyroid gland and adjacent soft tissue
structures was performed.

[Series 1: unknown · 0.09mm/px · 14 of 37 slices shown]
[im 1/37]
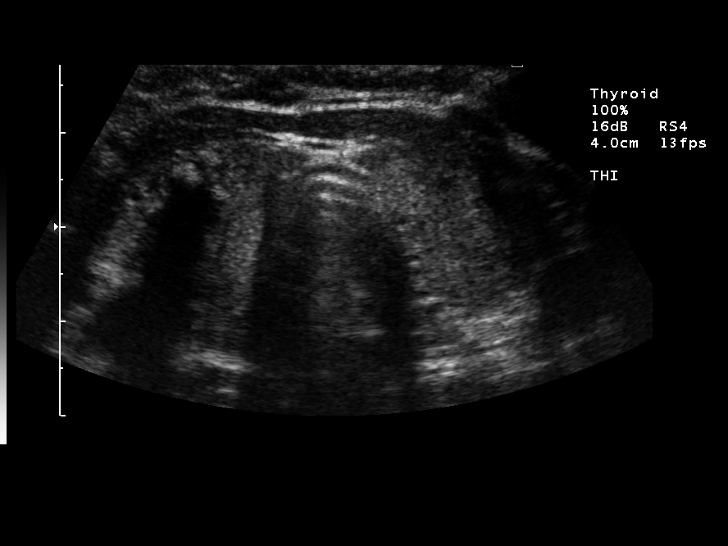
[im 4/37]
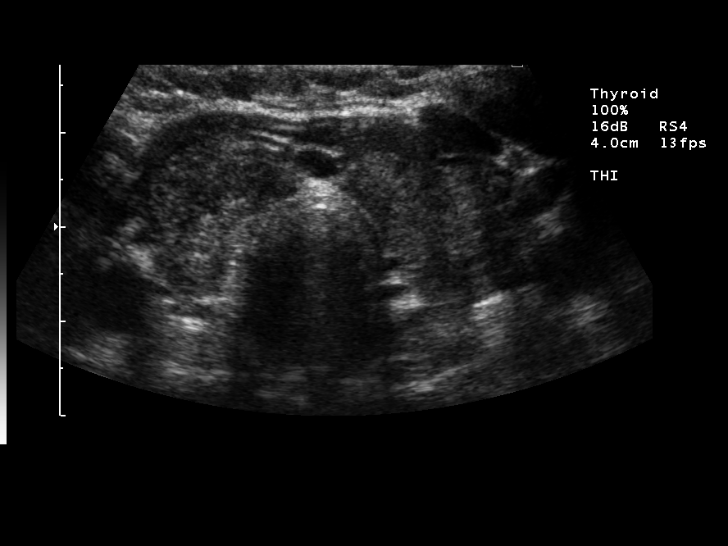
[im 7/37]
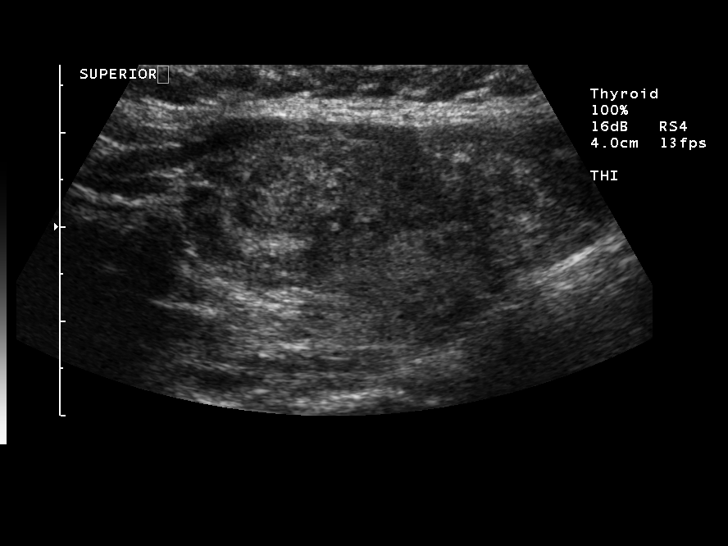
[im 10/37]
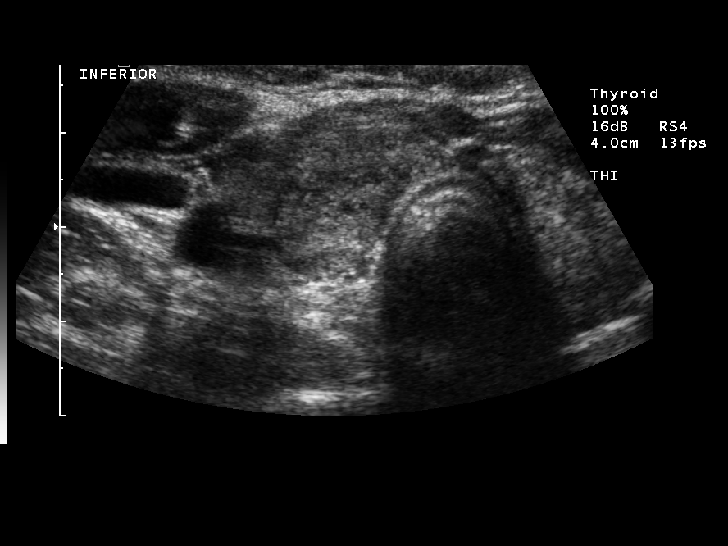
[im 13/37]
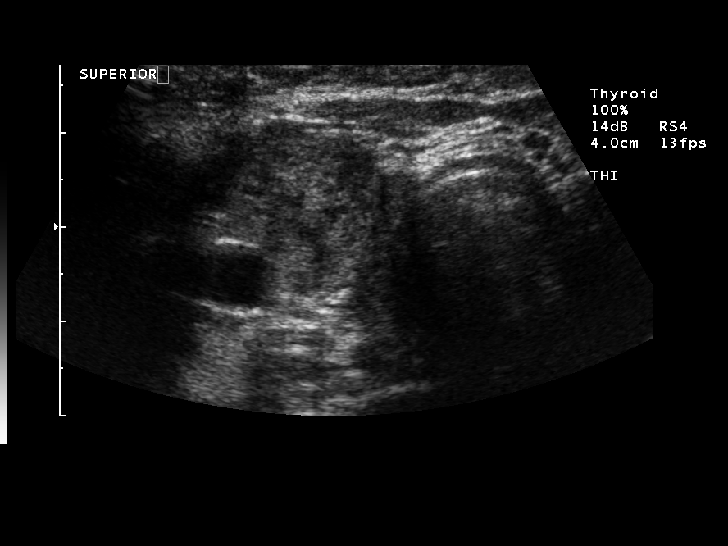
[im 14/37]
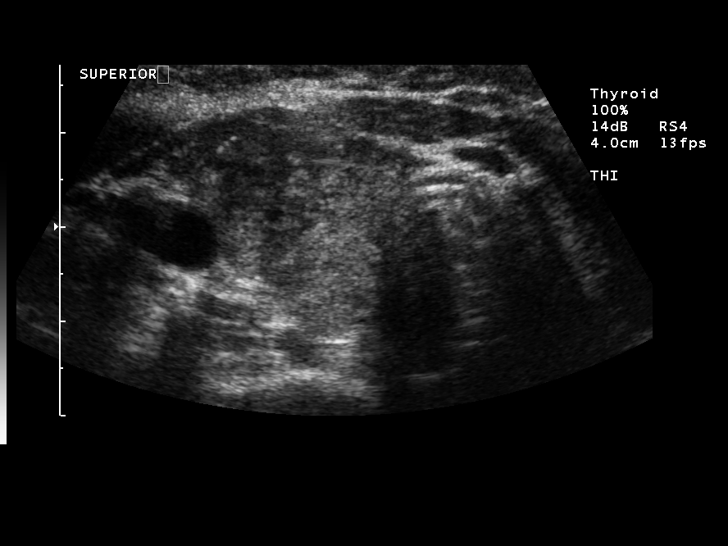
[im 17/37]
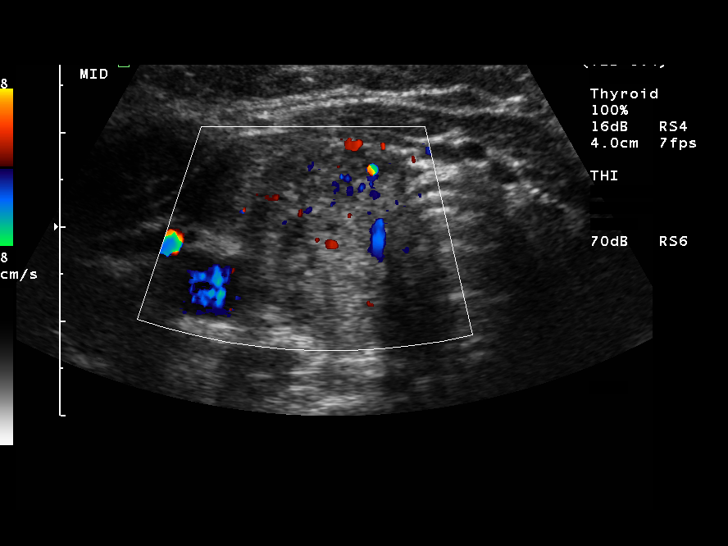
[im 20/37]
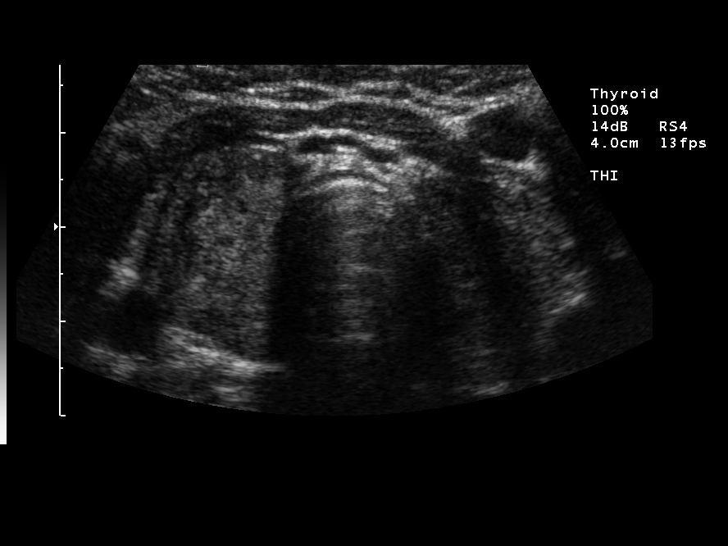
[im 23/37]
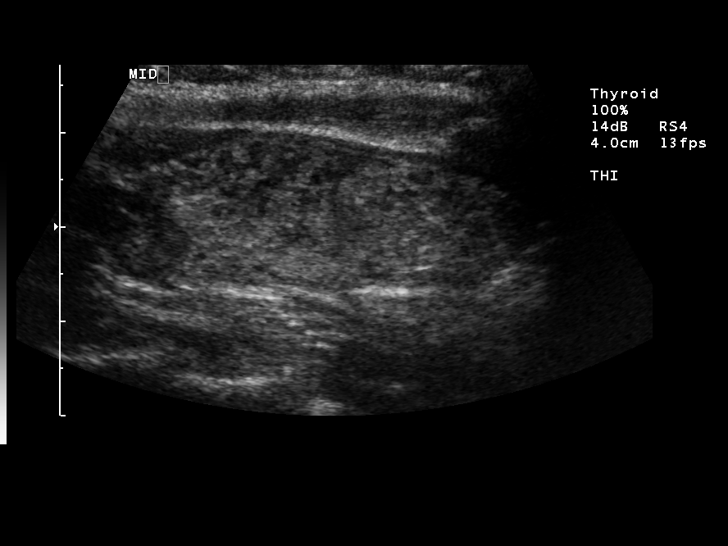
[im 25/37]
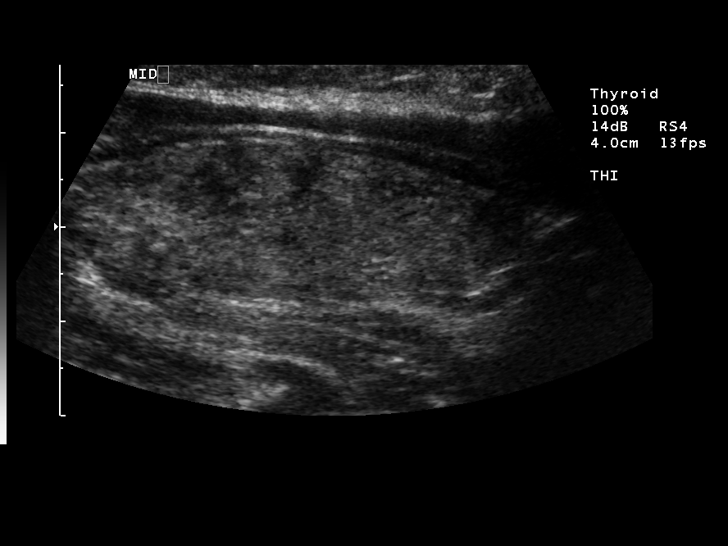
[im 28/37]
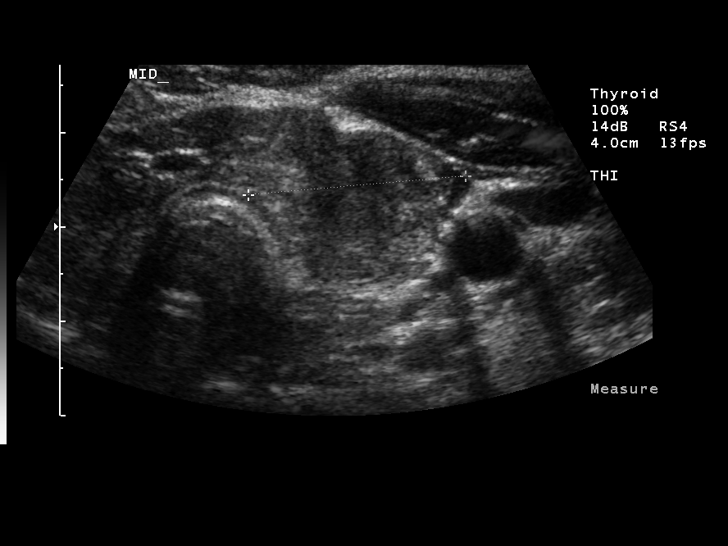
[im 31/37]
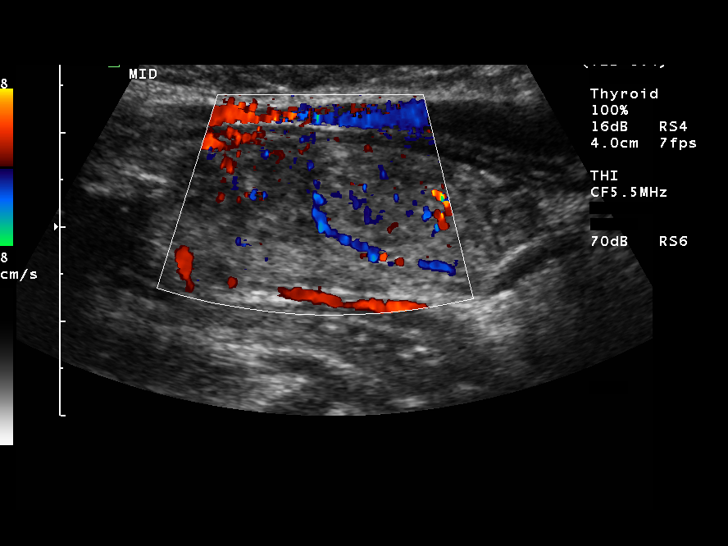
[im 34/37]
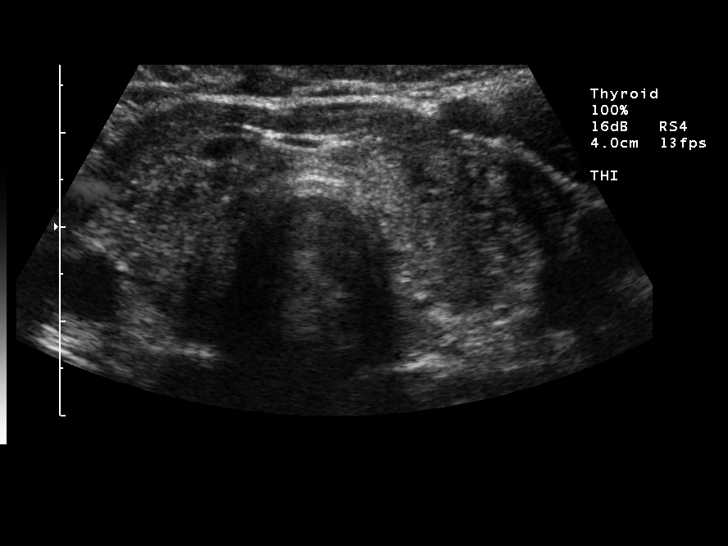
[im 37/37]
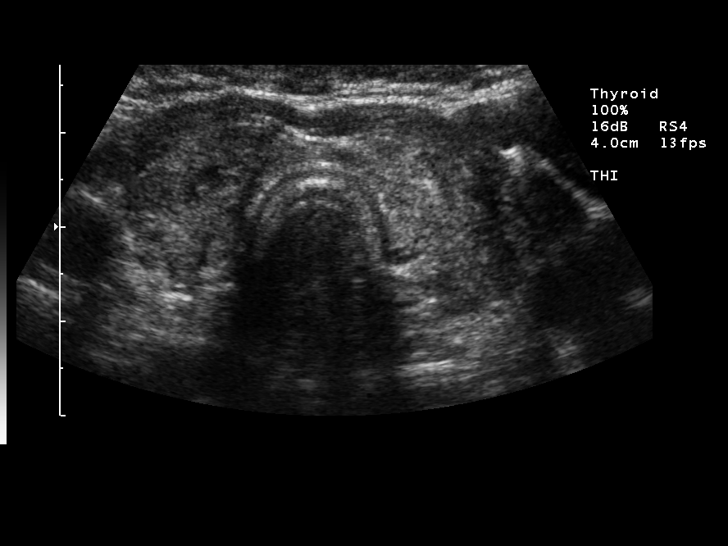

[14 of 25 positions shown; findings below may reference images not displayed]

FINDINGS: The previously demonstrated 6 x 6 x 3 mm calcified nodule in the mid
to lower portion of the right lobe of the thyroid gland currently measures 5 x 5
x 4 mm in maximum dimensions. There has been no significant change in diffuse
enlargement and inhomogeneity of the remainder of the thyroid gland. The right
lobe measures 5.6 x 2.3 x 1.8 cm in maximum dimensions. The left lobe measures
5.1 x 2.3 x 1.7 cm in maximum dimensions. The isthmus measures 3.4 mm in
thickness in the midline.

IMPRESSION

Stable multinodular goiter. No dominant mass.

## 2004-11-16 ENCOUNTER — Other Ambulatory Visit: Admission: RE | Admit: 2004-11-16 | Discharge: 2004-11-16 | Payer: Self-pay | Admitting: Obstetrics and Gynecology

## 2004-11-23 ENCOUNTER — Ambulatory Visit (HOSPITAL_COMMUNITY): Admission: RE | Admit: 2004-11-23 | Discharge: 2004-11-23 | Payer: Self-pay | Admitting: Neurology

## 2004-11-23 IMAGING — MR MR HEAD WO/W CM
8 of 11 series · 30 of 48 positions shown · IV contrast (20cc  MAG)
Comparison: MRI of 07/26/03.

CLINICAL DATA: Right-sided weakness.   Adult sedation.
MRI BRAIN WITHOUT AND WITH CONTRAST:
TECHNIQUE: Multiplanar and multiecho pulse sequences of the brain and surrounding structures were obtained according to standard protocol before and after administration of intravenous contrast. The patient was sedated using increment doses of Versed and fentanyl with good effect.  This was outlined on the nursing worksheet.
Contrast:  20 cc Omniscan 300 IV.

[Series 2: T1 · sagittal · 5.0mm · 0.43mm/px · 1 of 12 slices shown]
[im 1/12]
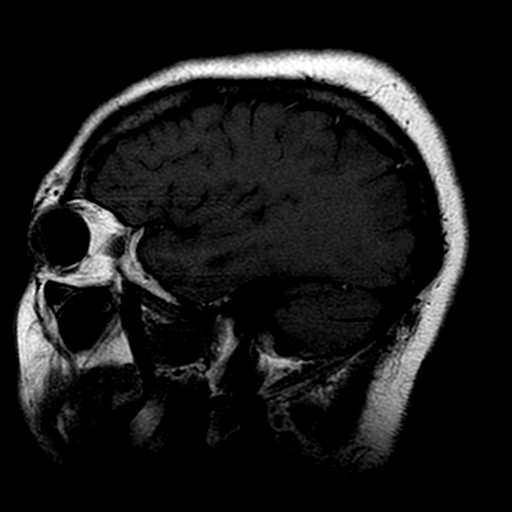

[Series 3: DWI · axial · 5.0mm · 1.41mm/px · z∈[-64,+79]mm · 8 of 54 slices shown (1 of 3)]
[im 1/54]
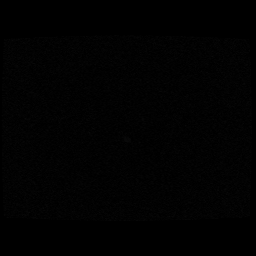
[im 8/54]
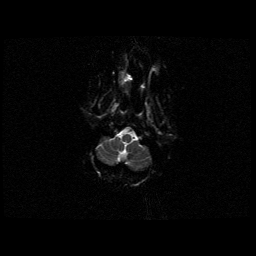
[im 16/54]
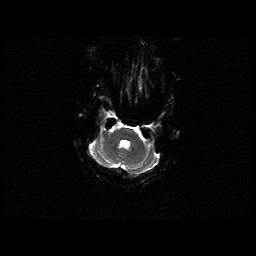
[im 23/54]
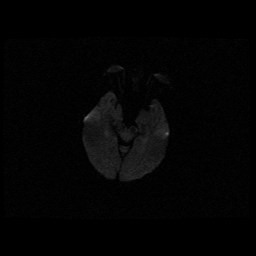
[im 31/54]
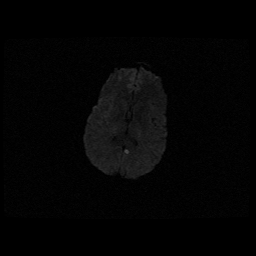
[im 38/54]
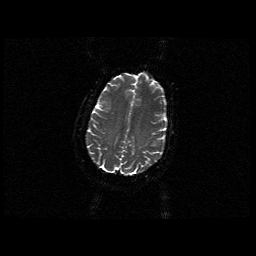
[im 46/54]
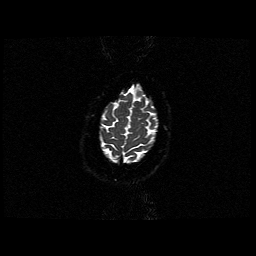
[im 54/54]
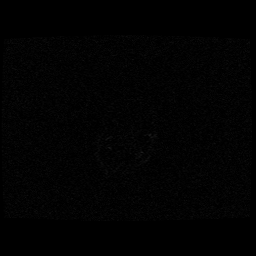

[Series 4: T2 · axial · 5.0mm · 0.43mm/px · z∈[-51,+82]mm · 3 of 20 slices shown (1 of 2)]
[im 1/20]
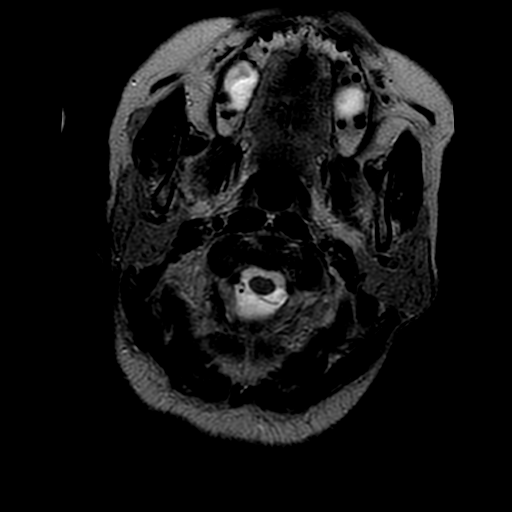
[im 10/20]
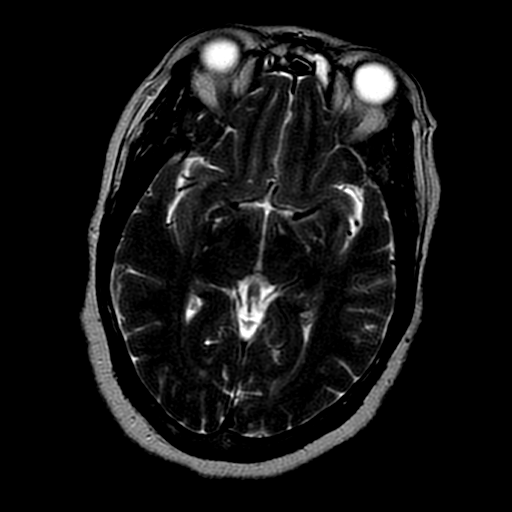
[im 20/20]
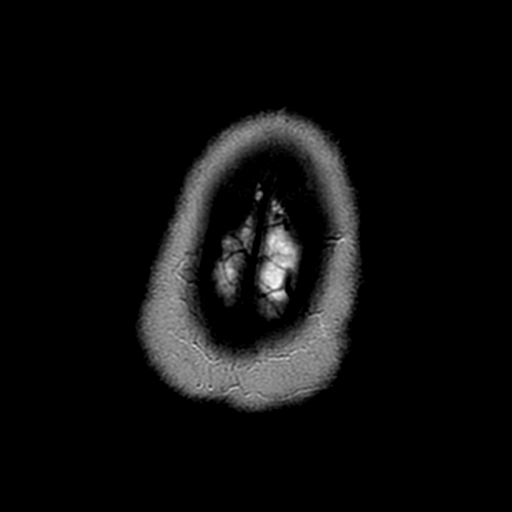

[Series 5: FLAIR · axial · 5.0mm · 0.43mm/px · z∈[-51,+82]mm · 3 of 20 slices shown]
[im 1/20]
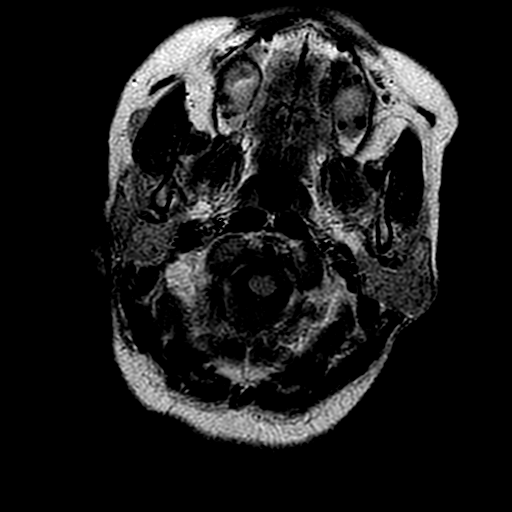
[im 10/20]
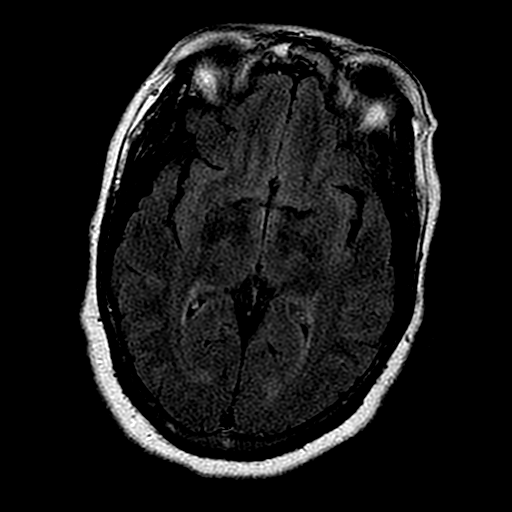
[im 20/20]
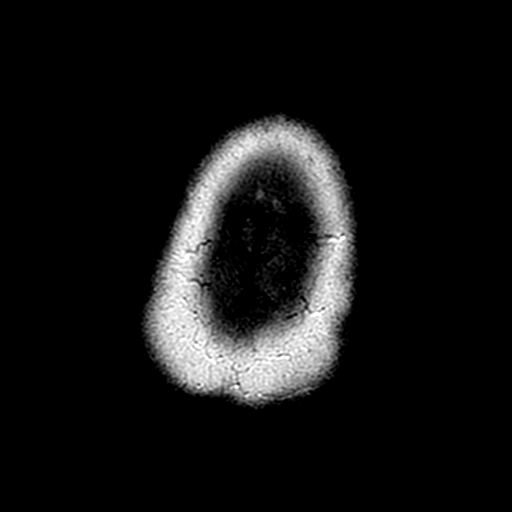

[Series 7: T2 · coronal · 5.0mm · 0.43mm/px · 4 of 24 slices shown (2 of 2)]
[im 1/24]
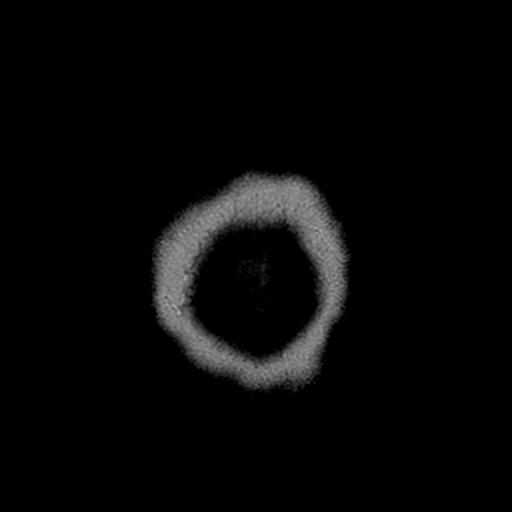
[im 8/24]
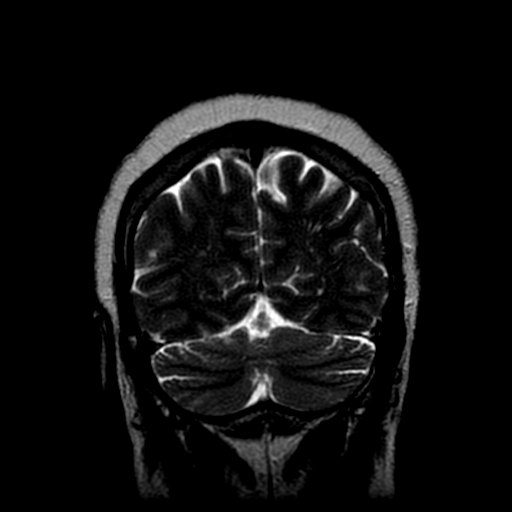
[im 16/24]
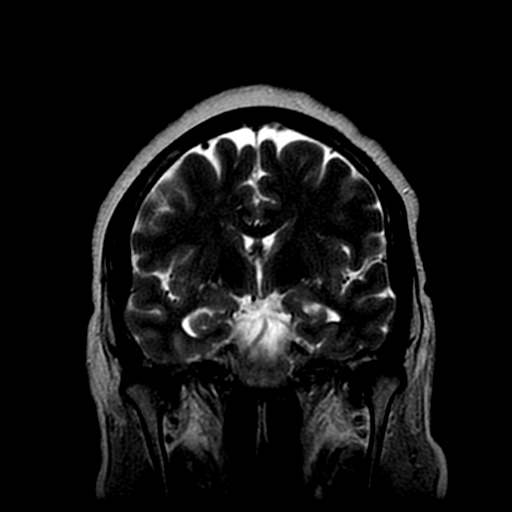
[im 24/24]
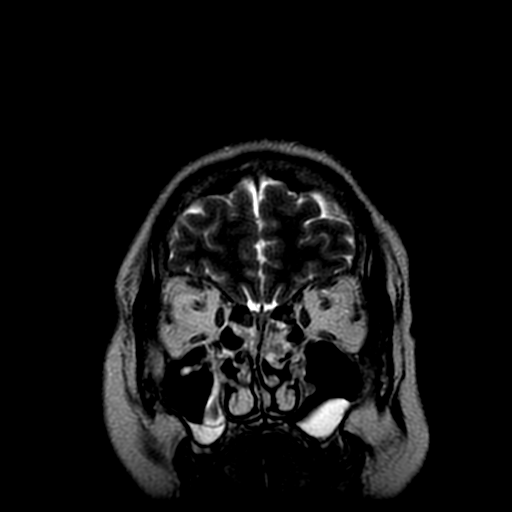

[Series 9: T1 post-contrast · sagittal · 5.0mm · 0.43mm/px · 3 of 20 slices shown]
[im 1/20]
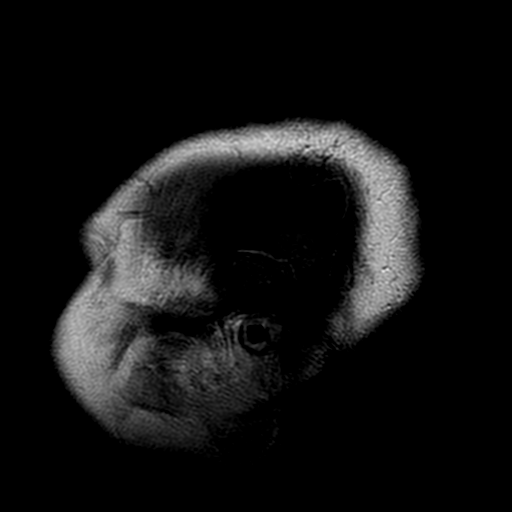
[im 10/20]
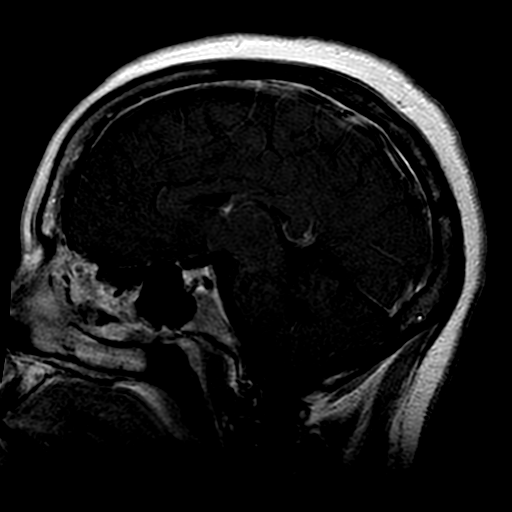
[im 20/20]
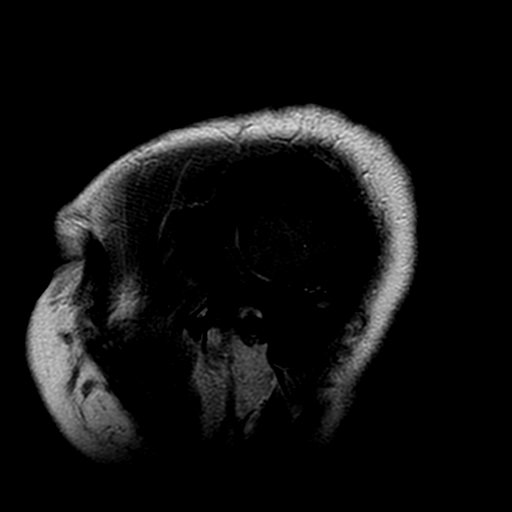

[Series 647: DWI · axial · 5.0mm · 1.41mm/px · z∈[-64,+79]mm · 4 of 27 slices shown (2 of 3)]
[im 1/27]
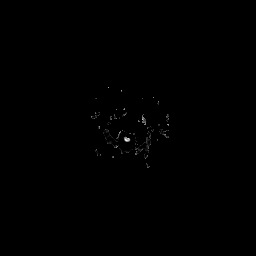
[im 9/27]
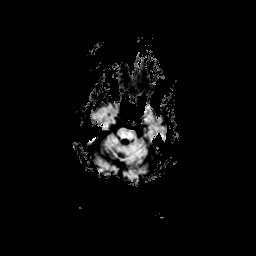
[im 18/27]
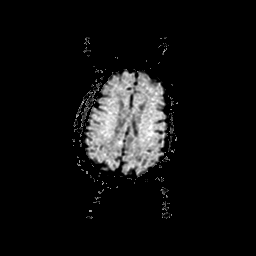
[im 27/27]
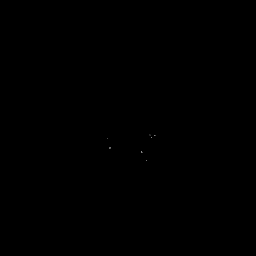

[Series 648: DWI · axial · 5.0mm · 1.41mm/px · z∈[-64,+79]mm · 4 of 27 slices shown (3 of 3)]
[im 1/27]
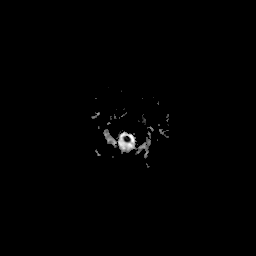
[im 9/27]
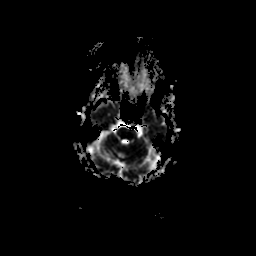
[im 18/27]
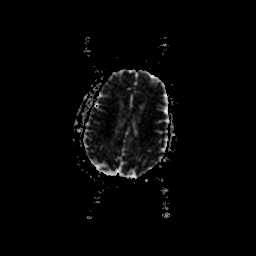
[im 27/27]
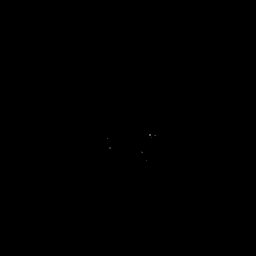

[30 of 48 positions shown; findings below may reference images not displayed]

FINDINGS: Sagittal images through the midline are unremarkable.  Diffusion images are again noted to be positive in the medial posterior parietal cortex.  T2-weighted images are normal except for mild hyperintensity in this region as are the FLAIR images.  The T1-weighted images show well-defined cystic change involving the cortex and adjacent white matter.  Post-infusion, there is no enhancement.  Comparison with the prior study reveals no interval change.  The overall size of the central portion of the lesion is 10 x 7 x 13 mm.  
On the previous scan the question of multiple sclerosis was raised with involvement of the adjacent corpus callosum.  Considering the essentially unchanged appearance over sixteen months, concern is raised for an intra-axial epidermoid lesion.  These lesions are typically positive on diffusion-weighted images and otherwise essentially isointense with CSF.  On other pulse sequences they do not typically enhance.  I do not see an obvious cause for the patient?s right-sided weakness.  It appears unlikely that this lesion would affect the corticospinal tract originating in the left precentral gyrus.  
Incidental note is made of chronic sinusitis in the maxillary and ethmoid regions.
IMPRESSION: 7 x 10 x 13 mm cystic lesion involving the parasagittal posterior parietal cortex and adjacent white matter, unchanged from 07/26/03; concern is raised for an intracranial intra-axial epidermoid cyst.

## 2005-02-05 ENCOUNTER — Encounter (INDEPENDENT_AMBULATORY_CARE_PROVIDER_SITE_OTHER): Payer: Self-pay | Admitting: Specialist

## 2005-02-05 ENCOUNTER — Ambulatory Visit (HOSPITAL_COMMUNITY): Admission: RE | Admit: 2005-02-05 | Discharge: 2005-02-05 | Payer: Self-pay | Admitting: *Deleted

## 2006-04-01 ENCOUNTER — Other Ambulatory Visit: Admission: RE | Admit: 2006-04-01 | Discharge: 2006-04-01 | Payer: Self-pay | Admitting: Internal Medicine

## 2007-05-05 ENCOUNTER — Ambulatory Visit (HOSPITAL_COMMUNITY): Admission: RE | Admit: 2007-05-05 | Discharge: 2007-05-05 | Payer: Self-pay | Admitting: Endocrinology

## 2007-05-05 IMAGING — US US SOFT TISSUE HEAD/NECK
1 series · 14 of 24 positions shown · non-contrast
Comparison: Thyroid ultrasound 08/21/2004.

CLINICAL DATA: Multinodular goiter.

THYROID ULTRASOUND
TECHNIQUE: Ultrasound examination of the thyroid gland and adjacent
soft tissues was performed.

[Series 1: unknown · 0.09mm/px · 14 of 24 slices shown]
[im 1/24]
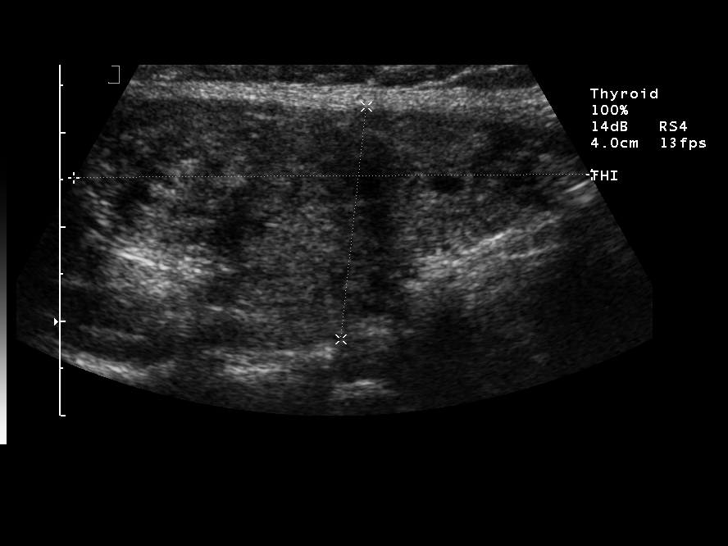
[im 3/24]
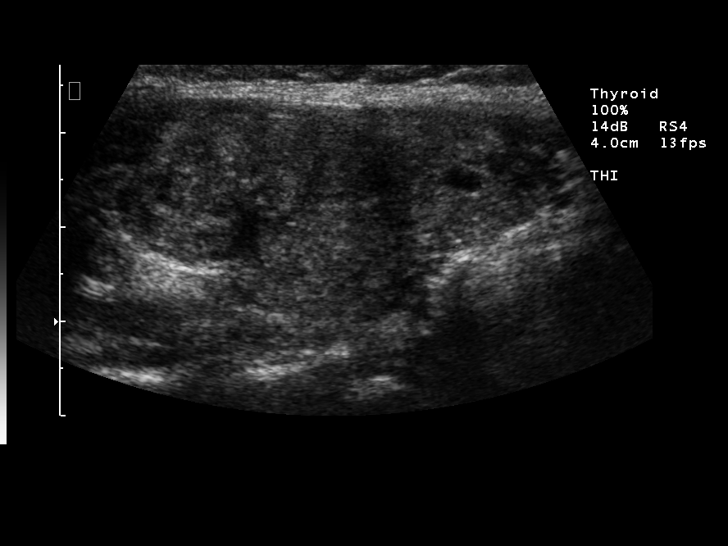
[im 5/24]
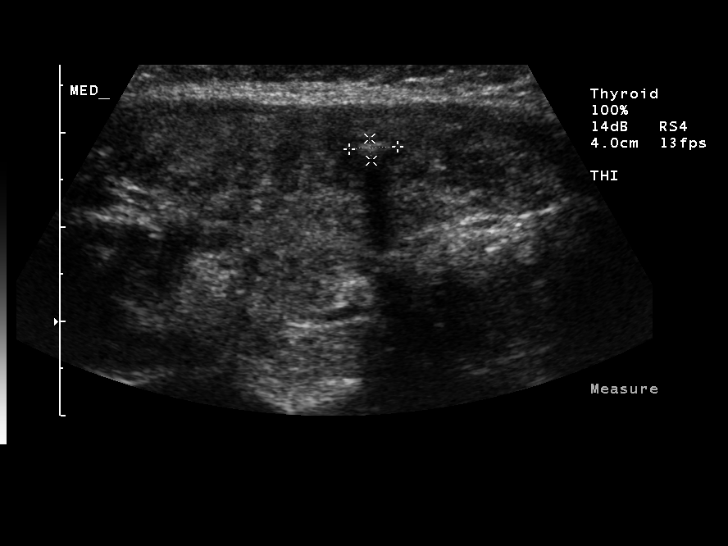
[im 7/24]
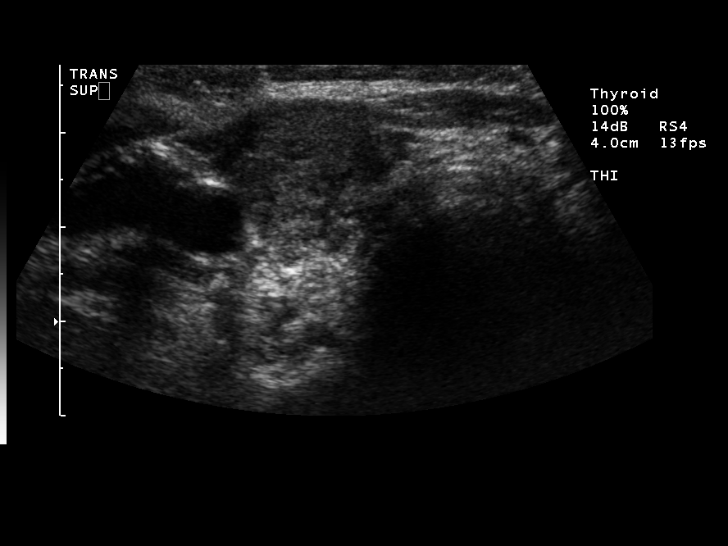
[im 8/24]
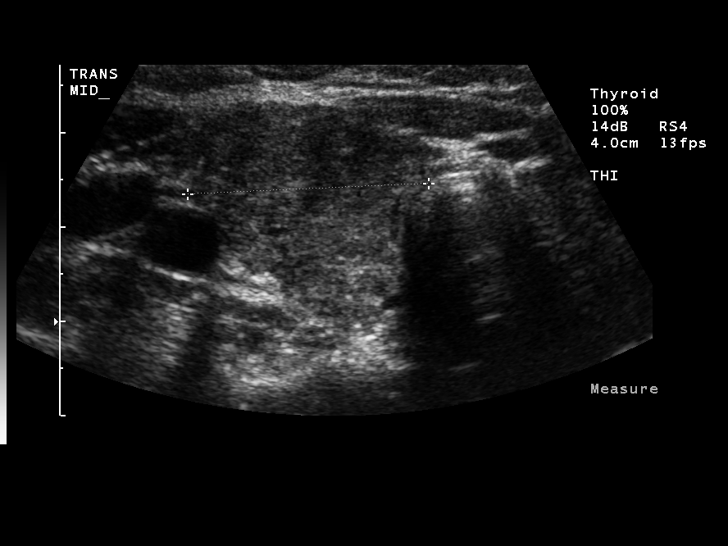
[im 10/24]
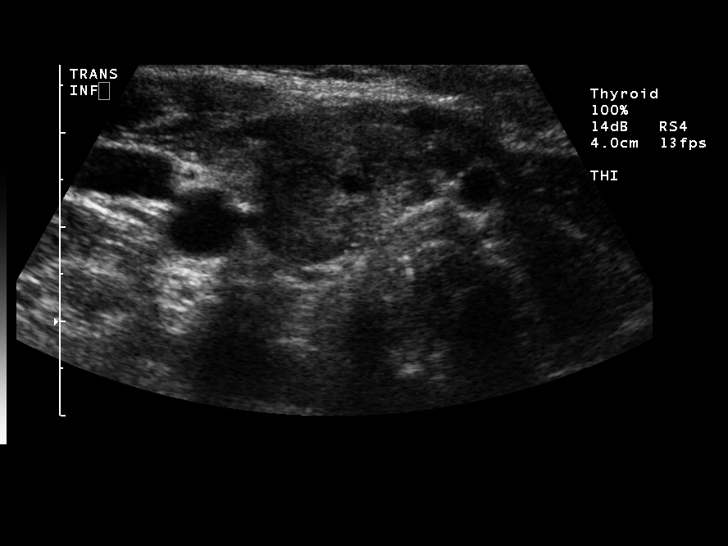
[im 12/24]
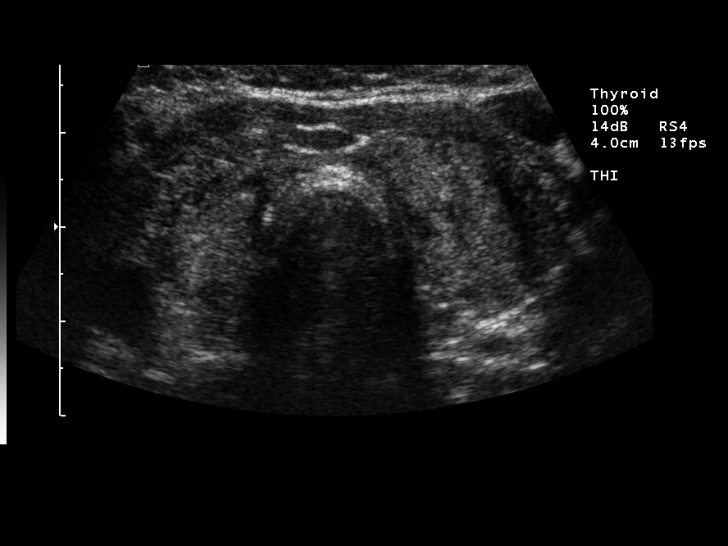
[im 13/24]
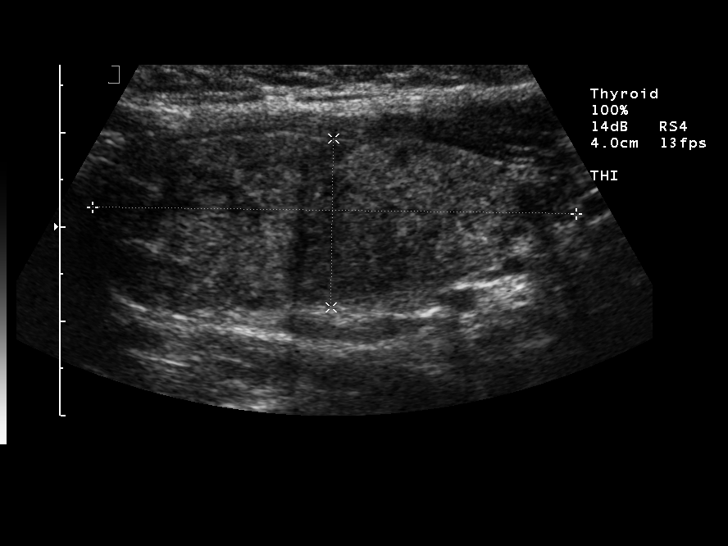
[im 15/24]
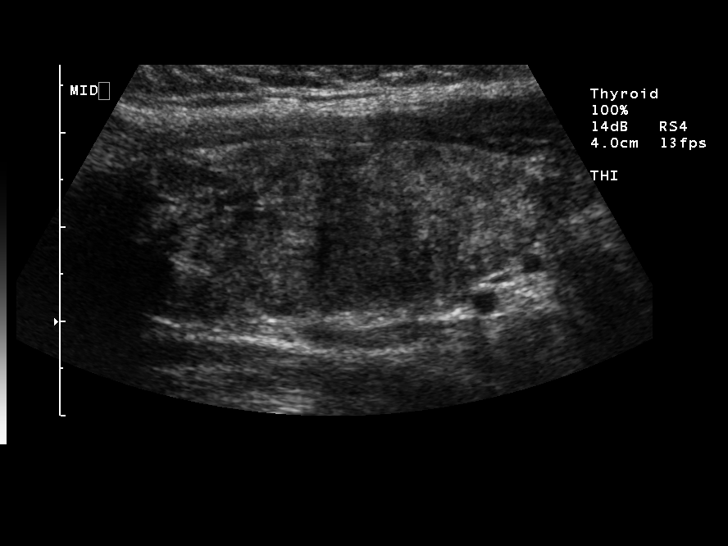
[im 17/24]
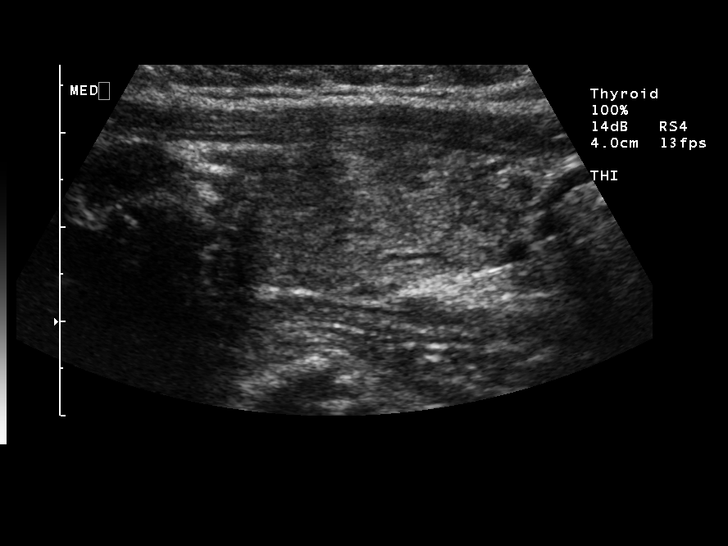
[im 19/24]
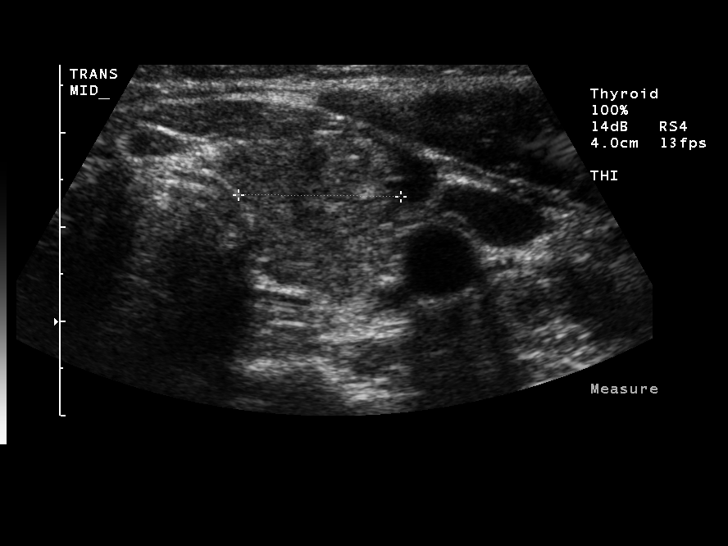
[im 20/24]
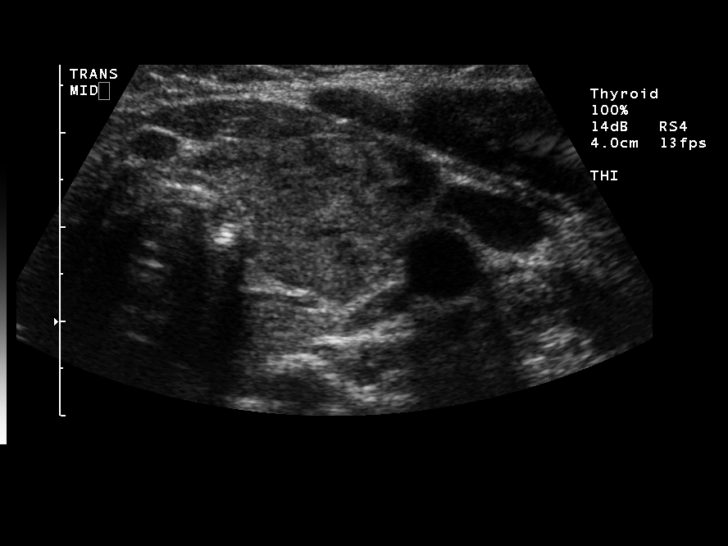
[im 22/24]
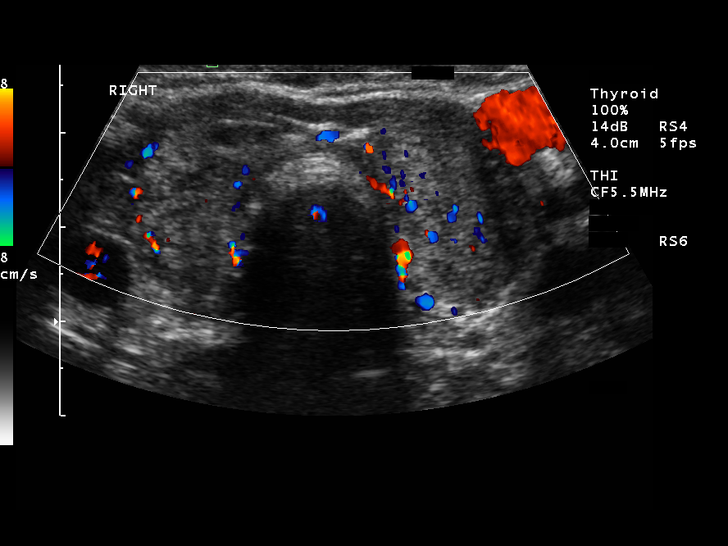
[im 24/24]
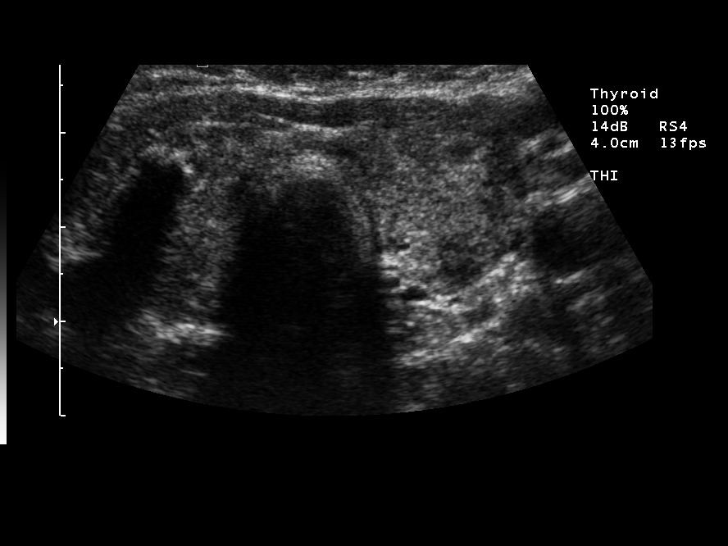

[14 of 24 positions shown; findings below may reference images not displayed]

FINDINGS: The right lobe of the thyroid gland measures 5.5 x 2.5 x
2.6 cm with the left lobe measuring 5.1 x 1.8 x 1.7 cm.  The
isthmus is 0.18 cm in thickness.  The thyroid is diffusely
heterogeneous in echotexture.  A tiny nodule is seen in the mid
pole of the right lobe measuring 0.5 cm in greatest dimension.  It
is unchanged.
IMPRESSION: No interval change the appearance of the thyroid gland in patient
with multinodular goiter.  No dominant mass.

## 2007-09-15 ENCOUNTER — Encounter: Admission: RE | Admit: 2007-09-15 | Discharge: 2007-09-15 | Payer: Self-pay | Admitting: Endocrinology

## 2007-09-15 IMAGING — US US ABDOMEN COMPLETE
1 series · 14 of 25 positions shown · non-contrast
Comparison: [REDACTED] abdominal ultrasound report 05/10/1999.

CLINICAL DATA: Epigastric abdominal pain.

ABDOMEN ULTRASOUND
TECHNIQUE: Complete abdominal ultrasound examination was performed
including evaluation of the liver, gallbladder, bile ducts,
pancreas, kidneys, spleen, IVC, and abdominal aorta.

[Series 1: us abdomen complete · 0.32mm/px · 14 of 76 slices shown]
[im 1/76]
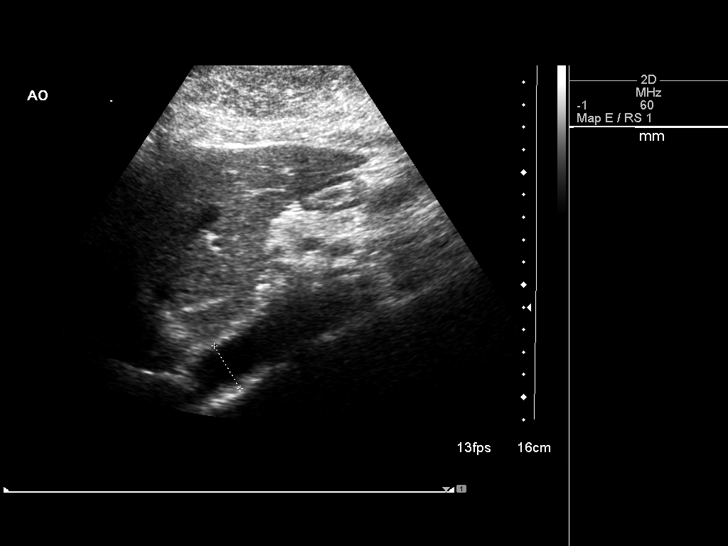
[im 7/76]
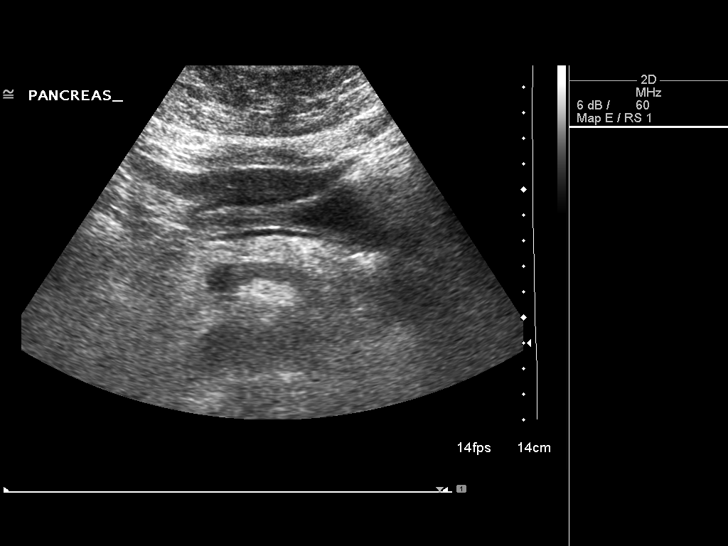
[im 13/76]
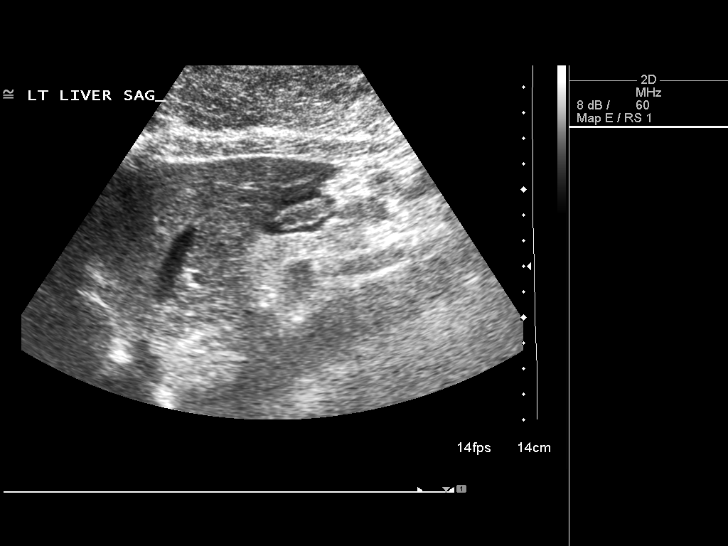
[im 19/76]
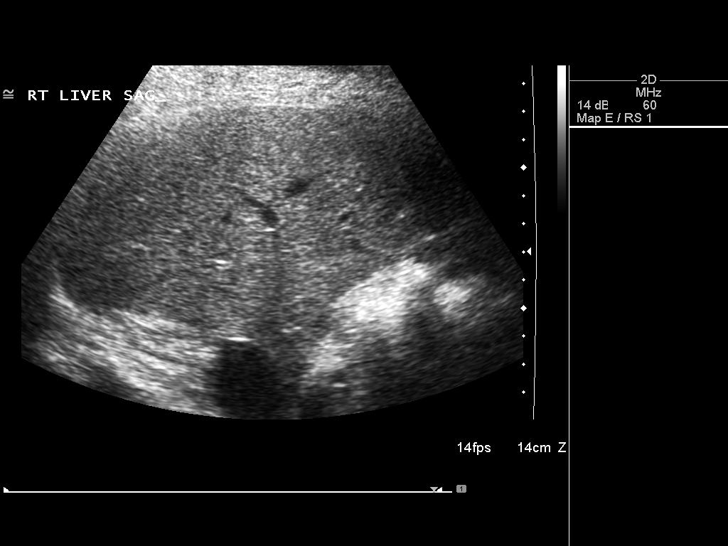
[im 26/76]
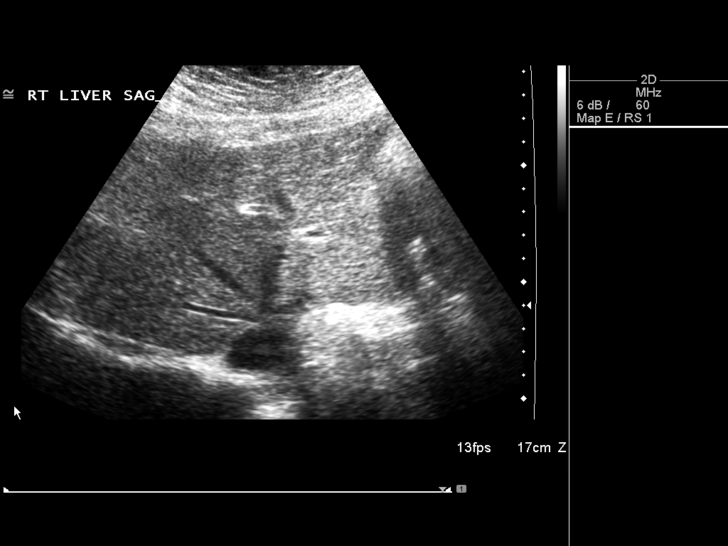
[im 29/76]
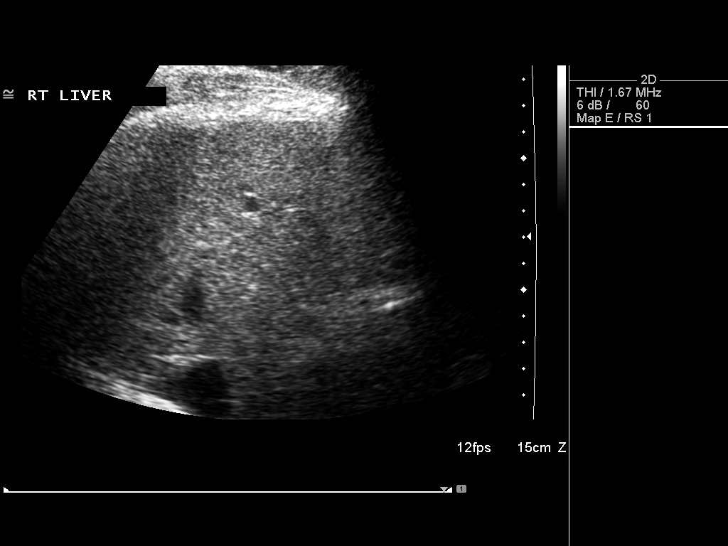
[im 35/76]
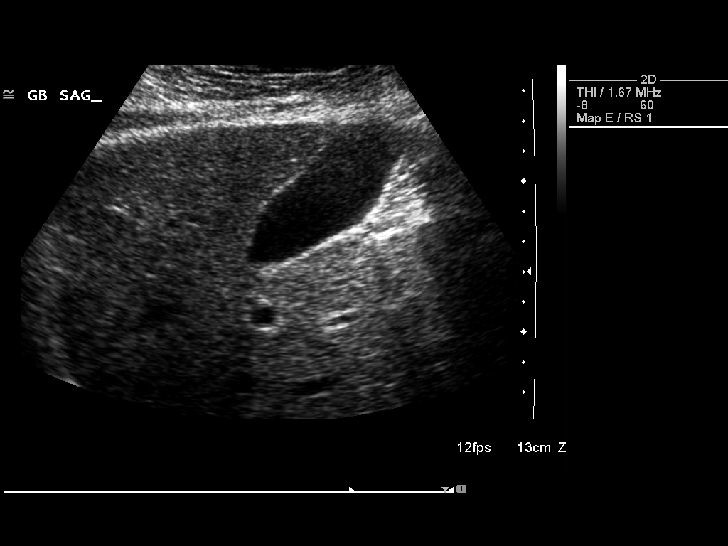
[im 41/76]
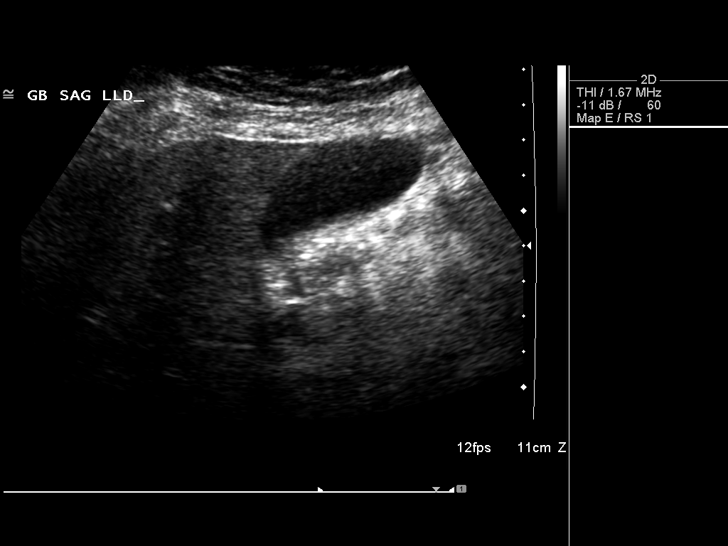
[im 47/76]
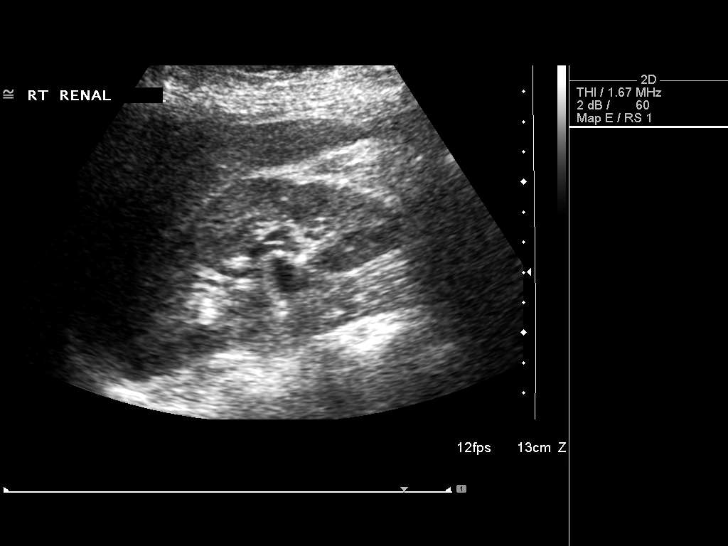
[im 51/76]
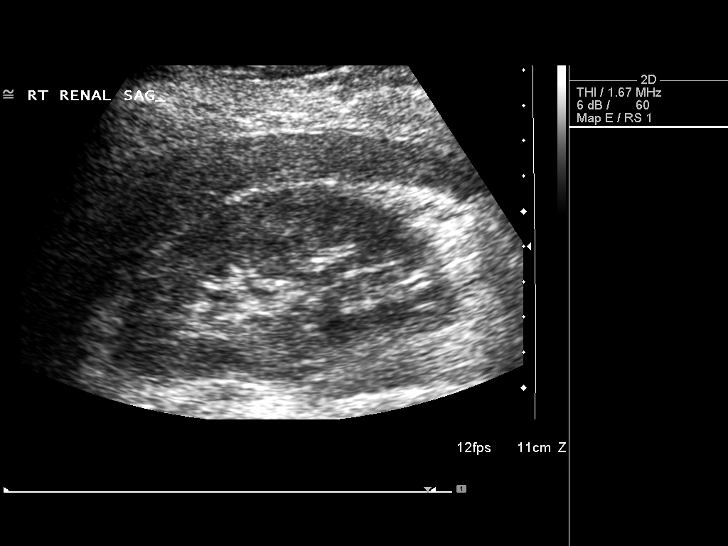
[im 57/76]
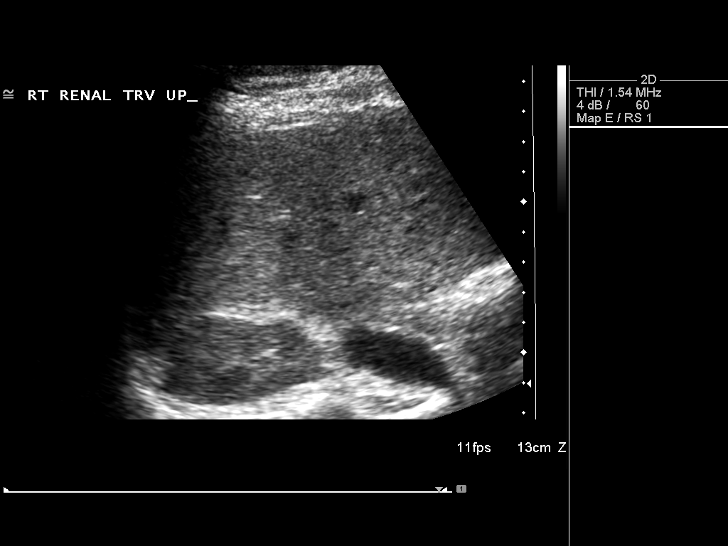
[im 63/76]
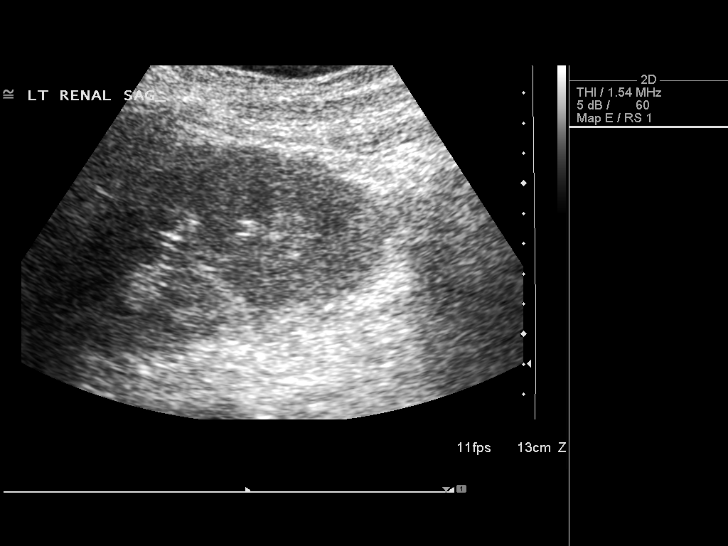
[im 69/76]
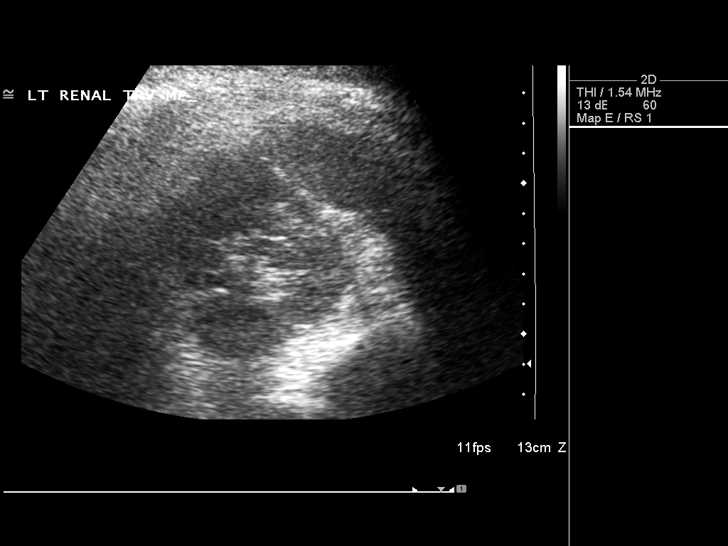
[im 76/76]
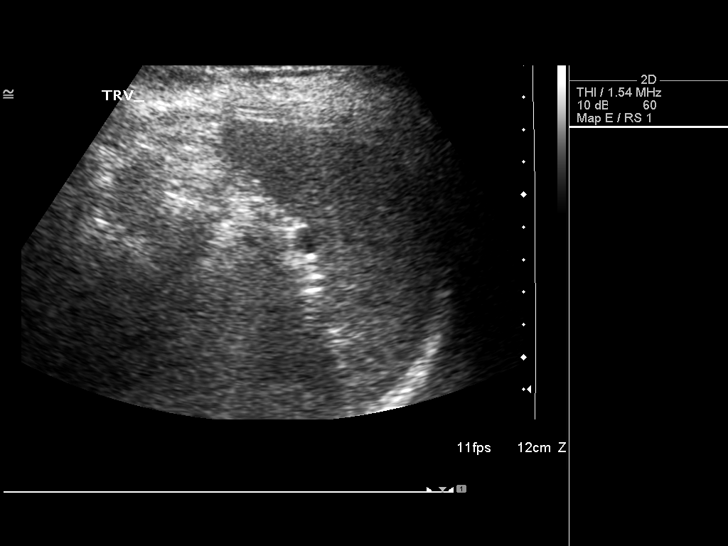

[14 of 25 positions shown; findings below may reference images not displayed]

FINDINGS: Gallbladder is sonographically normal without sludge or
gallstones with normal 2 mm wall thickness.  No dilated
intrahepatic or extrahepatic bile ducts are seen with common bile
duct measuring normally at 3 mm.  Visualized liver, inferior vena
cava, pancreas, spleen (9.1 cm long), right kidney (10.4 cm long),
left kidney (11 cm long), abdominal aorta (maximum diameter 2.2 cm)
appear sonographically normal.  No free fluid noted.
IMPRESSION: Normal.

## 2007-11-08 LAB — HM COLONOSCOPY

## 2007-11-10 ENCOUNTER — Ambulatory Visit (HOSPITAL_COMMUNITY): Admission: RE | Admit: 2007-11-10 | Discharge: 2007-11-10 | Payer: Self-pay | Admitting: *Deleted

## 2008-01-16 HISTORY — PX: OTHER SURGICAL HISTORY: SHX169

## 2008-01-16 HISTORY — PX: ESOPHAGOGASTRODUODENOSCOPY: SHX1529

## 2008-01-19 LAB — HM MAMMOGRAPHY

## 2009-05-02 ENCOUNTER — Encounter: Admission: RE | Admit: 2009-05-02 | Discharge: 2009-05-02 | Payer: Self-pay | Admitting: Endocrinology

## 2009-05-02 IMAGING — US US SOFT TISSUE HEAD/NECK
1 series · 13 of 25 positions shown · non-contrast
Comparison: Ultrasound of the thyroid of 05/05/2007

CLINICAL DATA: Thyroid goiter, the patient is on Synthroid, follow-
up

THYROID ULTRASOUND
TECHNIQUE: Ultrasound examination of the thyroid gland and
adjacent soft tissues was performed.

[Series 1: us soft tissue head/neck · 0.07mm/px · 13 of 46 slices shown]
[im 1/46]
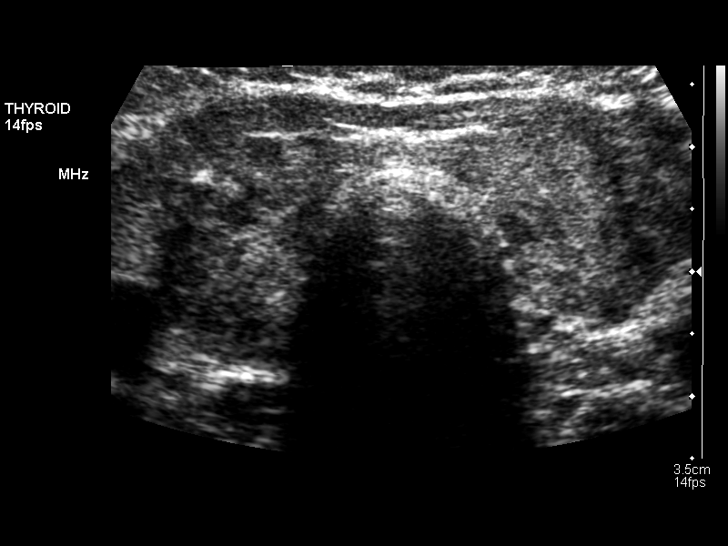
[im 4/46]
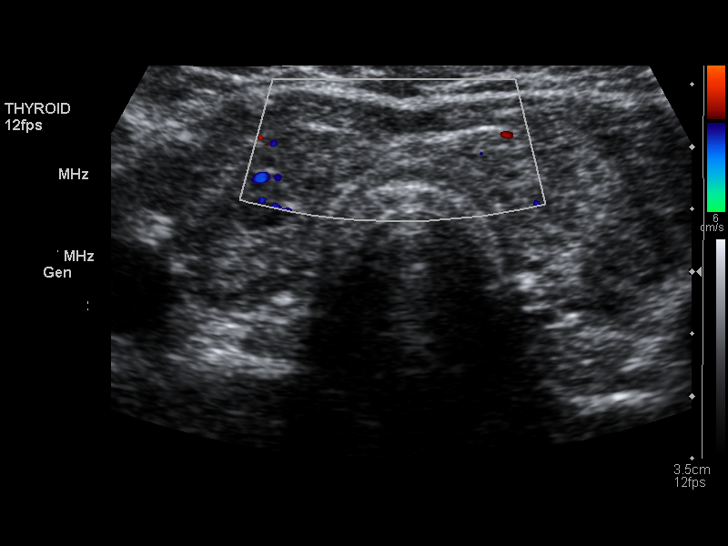
[im 8/46]
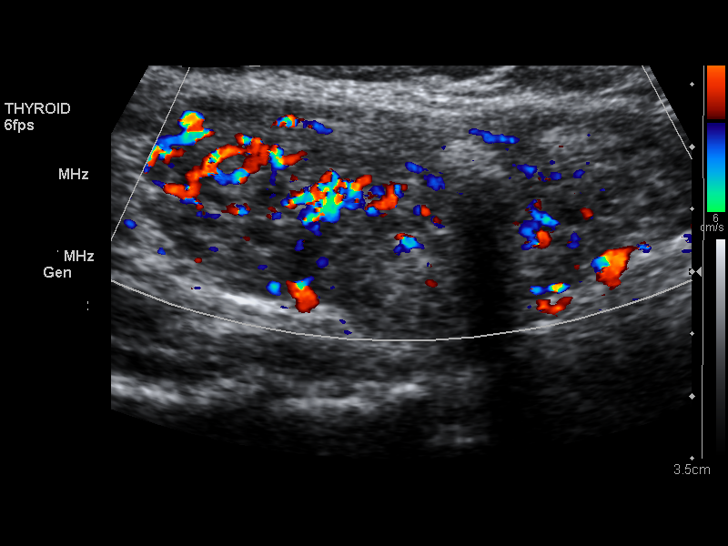
[im 12/46]
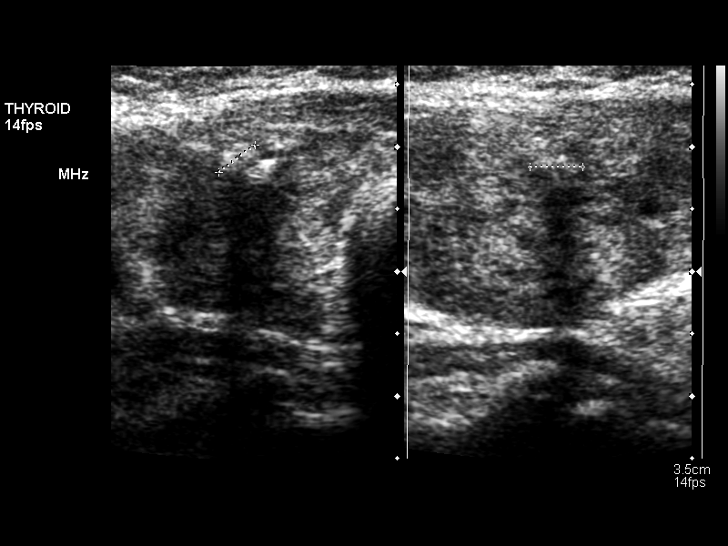
[im 16/46]
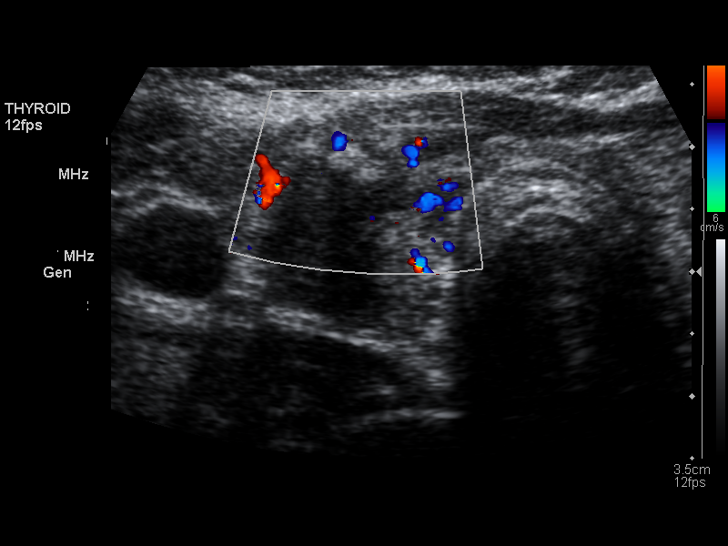
[im 19/46]
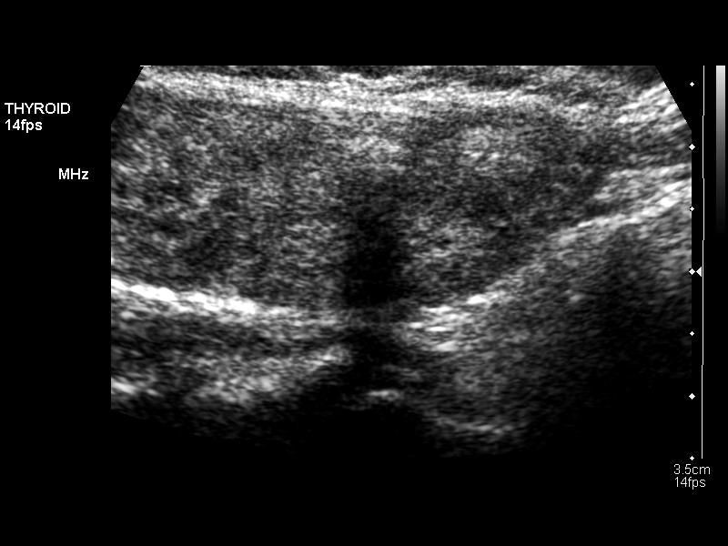
[im 23/46]
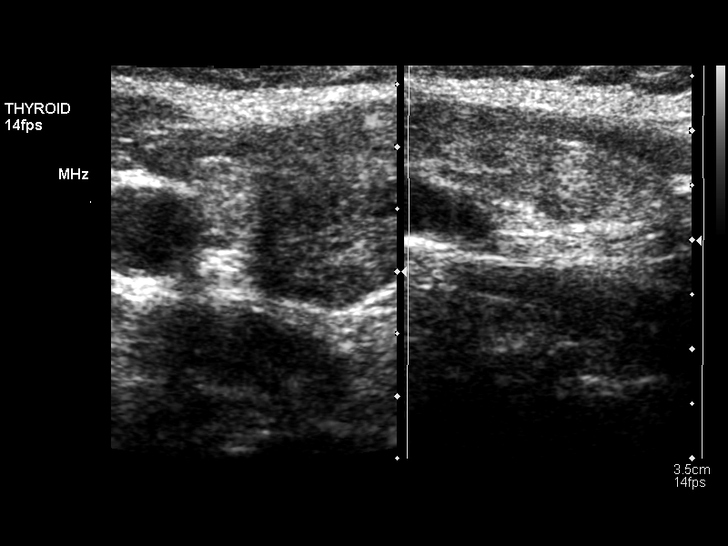
[im 27/46]
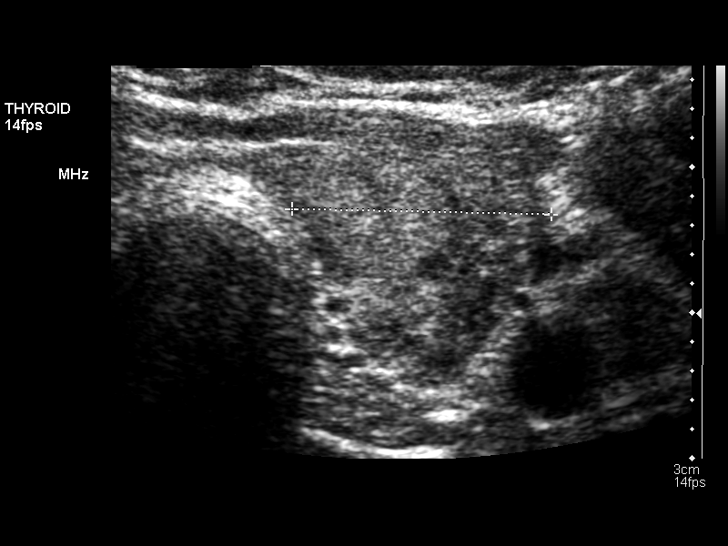
[im 31/46]
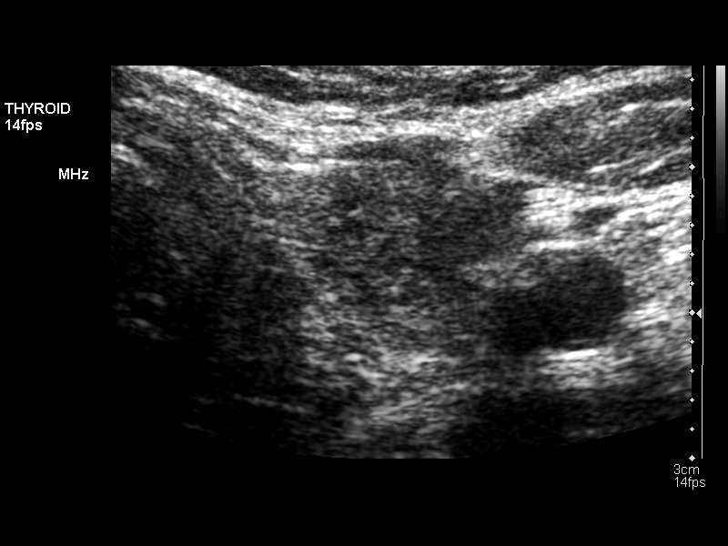
[im 34/46]
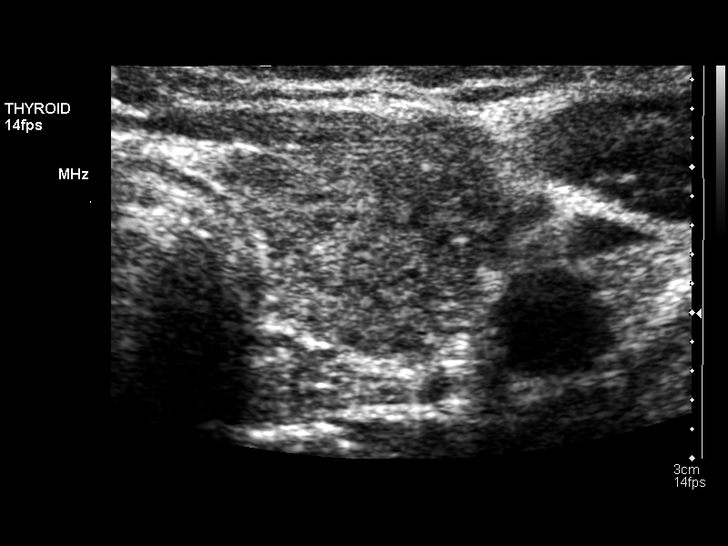
[im 38/46]
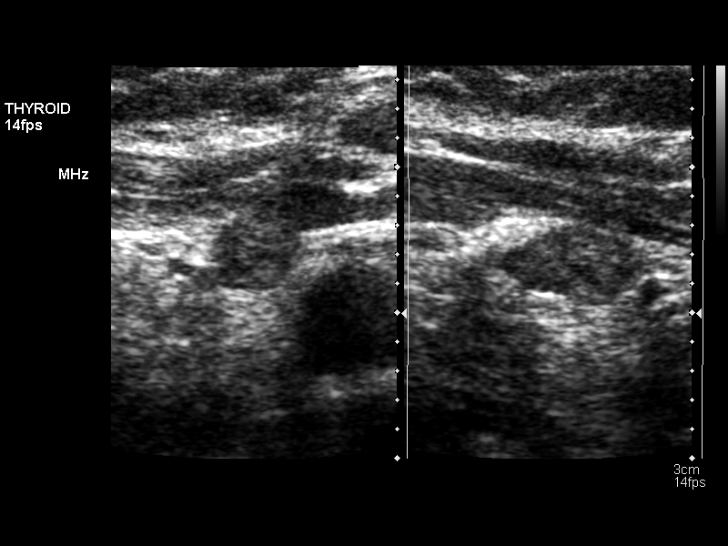
[im 42/46]
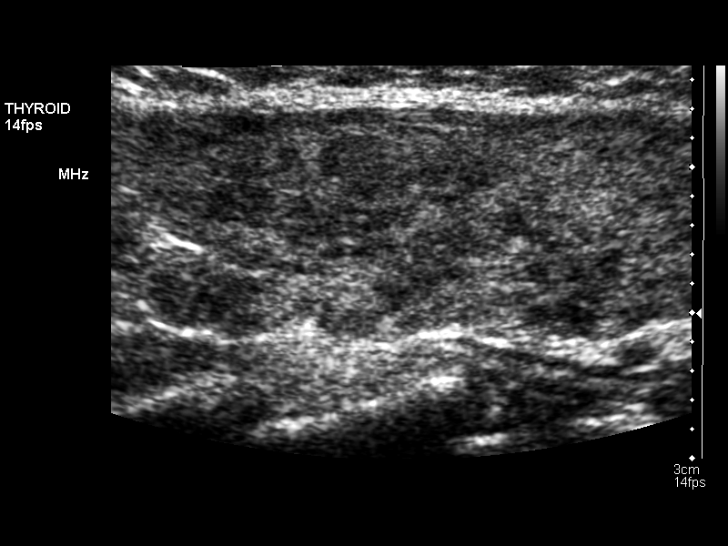
[im 46/46]
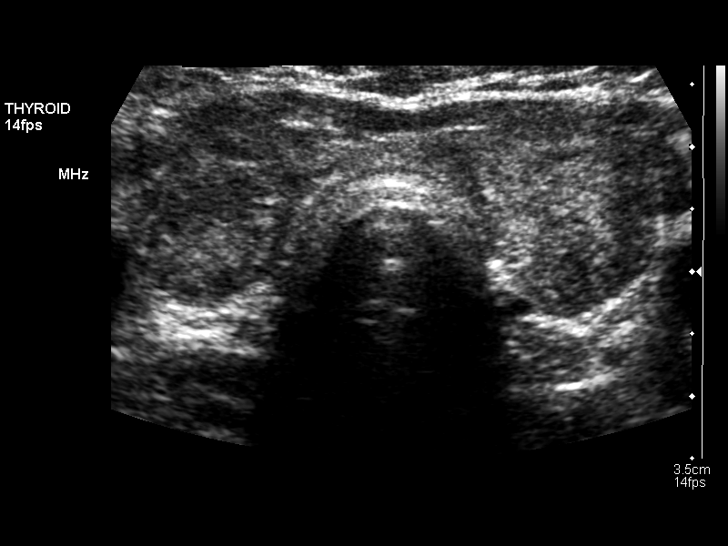

[13 of 25 positions shown; findings below may reference images not displayed]

FINDINGS: The thyroid gland is stable in size.  The right lobe
measures 5.0 cm sagittally, with a depth of 1.9 cm and width of
cm.  (Prior measurements were 5.5 x 2.5 x 2.6 cm).  The left lobe
currently measures 4.2 x 1.6 x 1.8 cm, with the isthmus measuring
2.8 mm.  (Prior measurements were 5.1 x 1.8 x 1.7 cm, with the
isthmus measuring 1.8 mm).

The gland is diffusely inhomogeneous.  Laterally in the right
middle lobe there is an oval subtle nodular lesion of 6 x 2 x 8 mm.
Two echogenic foci in the right mid lobe probably represent small
areas of calcification. Also, just inferior to the left lobe is an
oval soft tissue nodule probably representing a lymph node or
possibly a parathyroid adenoma of 6 x 7 x 10 mm.
IMPRESSION: 1.  The thyroid gland measures smaller than previously and is
diffusely inhomogeneous.  Only a single subtle nodule is noted in
the periphery of the right lobe of  6 x 2 x 8 mm.
2.  A small soft tissue nodule just inferior to the left lobe of
thyroid could represent either a lymph node or a small left
parathyroid adenoma of 6 x 7 x 10 mm.

## 2010-01-10 LAB — HM DEXA SCAN

## 2010-01-28 ENCOUNTER — Encounter: Payer: Self-pay | Admitting: Endocrinology

## 2010-01-28 ENCOUNTER — Encounter: Payer: Self-pay | Admitting: Cardiology

## 2010-02-14 ENCOUNTER — Inpatient Hospital Stay (INDEPENDENT_AMBULATORY_CARE_PROVIDER_SITE_OTHER)
Admission: RE | Admit: 2010-02-14 | Discharge: 2010-02-14 | Disposition: A | Discharge status: Home / Self Care | Payer: Federal, State, Local not specified - PPO | Source: Ambulatory Visit | Attending: Family Medicine | Admitting: Family Medicine

## 2010-02-14 ENCOUNTER — Ambulatory Visit (INDEPENDENT_AMBULATORY_CARE_PROVIDER_SITE_OTHER): Payer: Federal, State, Local not specified - PPO

## 2010-02-14 DIAGNOSIS — K5900 Constipation, unspecified: Secondary | ICD-10-CM

## 2010-02-14 IMAGING — CR DG ABDOMEN 1V
2 series · 2 of 2 positions shown · non-contrast
Comparison: None.

CLINICAL DATA: Left-sided abdominal pain and constipation.

ABDOMEN - 1 VIEW

[view not recorded (1 of 2)]
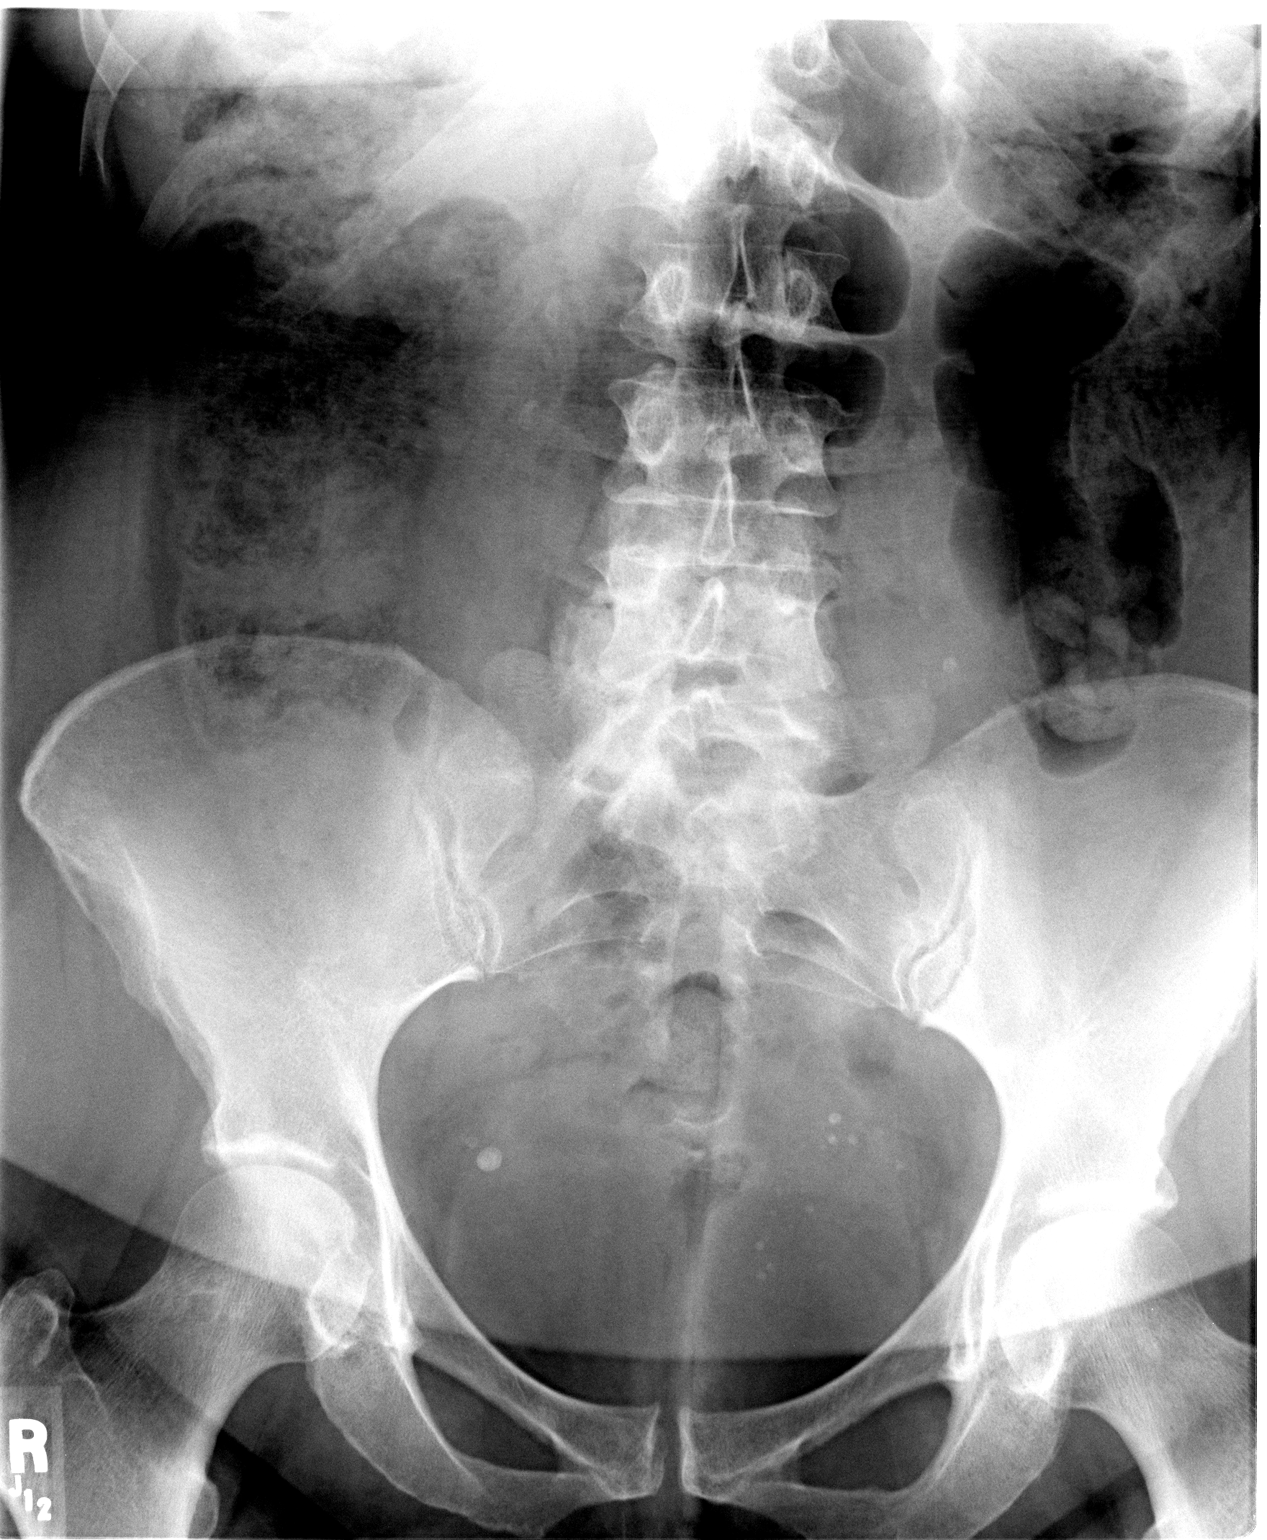

[view not recorded (2 of 2)]
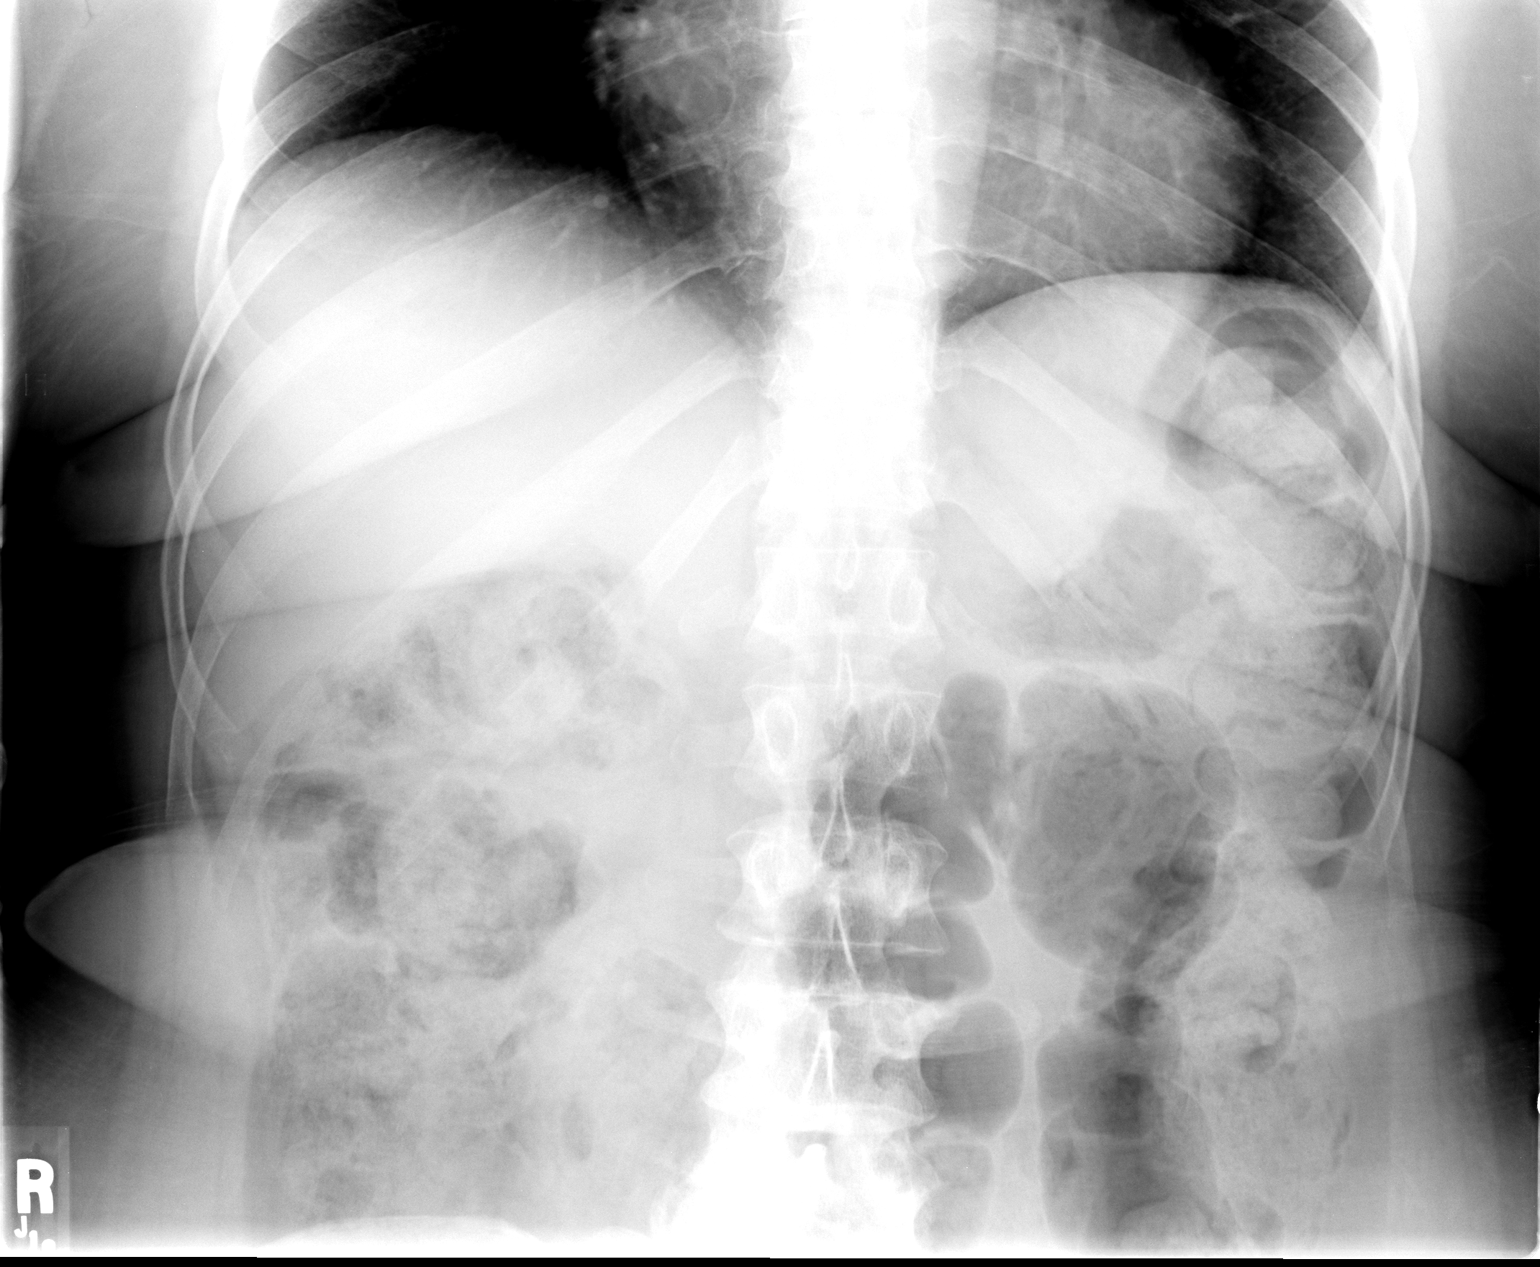

[2 of 2 positions shown; findings below may reference images not displayed]

FINDINGS: There is air and stool in the nondistended colon.  There
is no fecal impaction.

There are multiple calcifications in the pelvis as well as to the
left of the spine at the L4 level, all of which probably represent
phleboliths.  Degenerative facet arthritis is present at L4-5.
IMPRESSION: Benign-appearing abdomen.

## 2010-03-16 ENCOUNTER — Emergency Department (HOSPITAL_COMMUNITY)
Admission: EM | Admit: 2010-03-16 | Discharge: 2010-03-16 | Disposition: A | Discharge status: Home / Self Care | Payer: Federal, State, Local not specified - PPO | Attending: Emergency Medicine | Admitting: Emergency Medicine

## 2010-03-16 DIAGNOSIS — E119 Type 2 diabetes mellitus without complications: Secondary | ICD-10-CM | POA: Insufficient documentation

## 2010-03-16 DIAGNOSIS — E039 Hypothyroidism, unspecified: Secondary | ICD-10-CM | POA: Insufficient documentation

## 2010-03-16 DIAGNOSIS — I10 Essential (primary) hypertension: Secondary | ICD-10-CM | POA: Insufficient documentation

## 2010-03-16 DIAGNOSIS — K5900 Constipation, unspecified: Secondary | ICD-10-CM | POA: Insufficient documentation

## 2010-04-05 ENCOUNTER — Institutional Professional Consult (permissible substitution) (INDEPENDENT_AMBULATORY_CARE_PROVIDER_SITE_OTHER): Payer: Federal, State, Local not specified - PPO | Admitting: Family Medicine

## 2010-04-05 DIAGNOSIS — I10 Essential (primary) hypertension: Secondary | ICD-10-CM

## 2010-04-05 DIAGNOSIS — L538 Other specified erythematous conditions: Secondary | ICD-10-CM

## 2010-04-05 DIAGNOSIS — E119 Type 2 diabetes mellitus without complications: Secondary | ICD-10-CM

## 2010-04-05 DIAGNOSIS — K5900 Constipation, unspecified: Secondary | ICD-10-CM

## 2010-04-28 ENCOUNTER — Encounter: Payer: Self-pay | Admitting: Family Medicine

## 2010-05-22 NOTE — Op Note (Signed)
NAMEEMYLIA, LATELLA              ACCOUNT NO.:  [id number]   MEDICAL RECORD NO.:  [id number]          PATIENT TYPE:  AMB   LOCATION:  ENDO                         FACILITY:  [facility]   PHYSICIAN:  Georgiana Spinner, M.D.    DATE OF BIRTH:  Sep 17, 1950   DATE OF PROCEDURE:  11/10/2007  DATE OF DISCHARGE:                               OPERATIVE REPORT   PROCEDURE:  Colonoscopy.   INDICATIONS:  Colon cancer screening, rectal bleeding.   ANESTHESIA:  Fentanyl 50 mcg, Versed 4 mg.   PROCEDURE:  With the patient mildly sedated in the left lateral  decubitus position, the Pentax videoscopic colonoscope was inserted in  the rectum and passed under direct vision with pressure applied to reach  the cecum, identified by ileocecal valve and appendiceal orifice, both  of which were photographed.  From this point the colonoscope was slowly  withdrawn and taking circumferential views of colonic mucosa, stopping  only in the rectum which appeared normal on direct and showed  hemorrhoids on retroflexed view.  The endoscope was straightened and  withdrawn.  The patient's vital signs, pulse oximeter remained stable.  The patient tolerated the procedure well without apparent complications.   FINDINGS:  Internal hemorrhoids.  Otherwise an unremarkable examination.   PLAN:  Consider repeat examination 5 to 10 years.           ______________________________  Georgiana Spinner, M.D.     GMO/MEDQ  D:  11/10/2007  T:  11/10/2007  Job:  [id number]

## 2010-05-22 NOTE — Op Note (Signed)
[name], [name]              ACCOUNT NO.:  [id number]   MEDICAL RECORD NO.:  [id number]          PATIENT TYPE:  AMB   LOCATION:  ENDO                         FACILITY:  [facility]   PHYSICIAN:  Georgiana Spinner, M.D.    DATE OF BIRTH:  1951/01/01   DATE OF PROCEDURE:  11/10/2007  DATE OF DISCHARGE:                               OPERATIVE REPORT   PROCEDURE PERFORMED:  Upper endoscopy.   INDICATIONS:  History of gastritis, H. pylori positive.   ANESTHESIA:  Fentanyl 50 mcg, Versed 6 mg.   PROCEDURE:  With the patient mildly sedated in the left lateral  decubitus position, the Pentax videoscopic endoscope was inserted in the  mouth and passed under direct vision through the esophagus, which  appeared normal into the stomach.  Fundus, body, antrum appeared normal  as did duodenal bulb and second portion of duodenum.  From this point,  the endoscope was slowly withdrawn, taking circumferential views of  duodenal mucosa until the endoscope was pulled back into the stomach and  placed in retroflexion to view the stomach from below.  The endoscope  was then straightened and withdrawn, taking circumferential views of  remaining gastric and esophageal mucosa.  The patient's vital signs and  pulse oximeter range remained stable.  The patient tolerated the  procedure well without apparent complications.   FINDINGS:  Rather unremarkable examination.   PLAN:  Proceed to colonoscopy.           ______________________________  Georgiana Spinner, M.D.     GMO/MEDQ  D:  11/10/2007  T:  11/10/2007  Job:  [id number]

## 2010-05-25 NOTE — Op Note (Signed)
NAMEHAVA, MASSINGALE              ACCOUNT NO.:  [id number]   MEDICAL RECORD NO.:  [id number]          PATIENT TYPE:  AMB   LOCATION:  ENDO                         FACILITY:  [facility]   PHYSICIAN:  Georgiana Spinner, M.D.    DATE OF BIRTH:  06-16-1950   DATE OF PROCEDURE:  02/05/2005  DATE OF DISCHARGE:                                 OPERATIVE REPORT   PROCEDURE PERFORMED:  Upper endoscopy.   INDICATIONS FOR PROCEDURE:  Gastroesophageal reflux disease.   ANESTHESIA:  Demerol 50 mg, Versed 7.5 mg.   DESCRIPTION OF PROCEDURE:  With the patient mildly sedated in the left  lateral decubitus position, the Olympus video endoscope was inserted in the  mouth passed under direct vision through the esophagus which appeared  normal.  We entered into the stomach and saw some flecks of blood in the  stomach.  Fundus, body, antrum, duodenal bulb, second portion of duodenum  were visualized.  From this point the endoscope was slowly withdrawn, taking  circumferential views of the duodenal mucosa until the endoscope had been  pulled back into the stomach, placed on retroflexion to view the stomach  from below.  The endoscope was then straightened and withdrawn, taking  circumferential views of the remaining gastric and esophageal mucosa  stopping once again to biopsy some fundic folds that appeared mildly  edematous.  Patient's vital signs and pulse oximeter remained stable.  The  patient tolerated the procedure well without apparent complications.   FINDINGS:  Some blood flecks in the stomach, but no discernible lesion other  than mild edema of the gastric folds.  Await biopsy report.  Patient will  call me for results and follow up with me as an outpatient.           ______________________________  Georgiana Spinner, M.D.     GMO/MEDQ  D:  02/05/2005  T:  02/05/2005  Job:  [id number]

## 2010-05-30 ENCOUNTER — Other Ambulatory Visit (HOSPITAL_COMMUNITY)
Admission: RE | Admit: 2010-05-30 | Discharge: 2010-05-30 | Disposition: A | Discharge status: Home / Self Care | Payer: Federal, State, Local not specified - PPO | Source: Ambulatory Visit | Attending: Family Medicine | Admitting: Family Medicine

## 2010-05-30 ENCOUNTER — Encounter: Payer: Self-pay | Admitting: Family Medicine

## 2010-05-30 ENCOUNTER — Ambulatory Visit (INDEPENDENT_AMBULATORY_CARE_PROVIDER_SITE_OTHER): Payer: Federal, State, Local not specified - PPO | Admitting: Family Medicine

## 2010-05-30 VITALS — BP 152/86 | HR 68 | Ht 66.0 in | Wt 224.0 lb

## 2010-05-30 DIAGNOSIS — E039 Hypothyroidism, unspecified: Secondary | ICD-10-CM

## 2010-05-30 DIAGNOSIS — R5383 Other fatigue: Secondary | ICD-10-CM

## 2010-05-30 DIAGNOSIS — E119 Type 2 diabetes mellitus without complications: Secondary | ICD-10-CM

## 2010-05-30 DIAGNOSIS — I10 Essential (primary) hypertension: Secondary | ICD-10-CM

## 2010-05-30 DIAGNOSIS — R319 Hematuria, unspecified: Secondary | ICD-10-CM

## 2010-05-30 DIAGNOSIS — Z0000 Encounter for general adult medical examination without abnormal findings: Secondary | ICD-10-CM

## 2010-05-30 DIAGNOSIS — Z01419 Encounter for gynecological examination (general) (routine) without abnormal findings: Secondary | ICD-10-CM | POA: Insufficient documentation

## 2010-05-30 DIAGNOSIS — R5381 Other malaise: Secondary | ICD-10-CM

## 2010-05-30 DIAGNOSIS — E7800 Pure hypercholesterolemia, unspecified: Secondary | ICD-10-CM

## 2010-05-30 LAB — LIPID PANEL
Cholesterol: 228 mg/dL — ABNORMAL HIGH (ref 0–200)
HDL: 57 mg/dL (ref >39)
LDL Cholesterol: 149 mg/dL — ABNORMAL HIGH (ref 0–99)
Total CHOL/HDL Ratio: 4 Ratio
Triglycerides: 110 mg/dL (ref <150)
VLDL: 22 mg/dL (ref 0–40)

## 2010-05-30 LAB — CBC WITH DIFFERENTIAL/PLATELET
HCT: 43 % (ref 36.0–46.0)
Hemoglobin: 13.8 g/dL (ref 12.0–15.0)
Lymphs Abs: 1.8 10*3/uL (ref 0.7–4.0)
MCH: 29.9 pg (ref 26.0–34.0)
MCHC: 32.1 g/dL (ref 30.0–36.0)
Monocytes Absolute: 0.3 10*3/uL (ref 0.1–1.0)
Monocytes Relative: 6 % (ref 3–12)
Neutro Abs: 3.2 10*3/uL (ref 1.7–7.7)
Neutrophils Relative %: 56 % (ref 43–77)
RBC: 4.61 MIL/uL (ref 3.87–5.11)

## 2010-05-30 LAB — POCT URINALYSIS DIPSTICK
Bilirubin, UA: NEGATIVE
Glucose, UA: NEGATIVE
Ketones, UA: NEGATIVE
Spec Grav, UA: 1.015

## 2010-05-30 LAB — COMPREHENSIVE METABOLIC PANEL
ALT: <8 U/L (ref 0–35)
AST: 14 U/L (ref 0–37)
BUN: 15 mg/dL (ref 6–23)
Calcium: 9.6 mg/dL (ref 8.4–10.5)
Chloride: 103 mEq/L (ref 96–112)
Creat: 0.91 mg/dL (ref 0.40–1.20)
Total Bilirubin: 0.3 mg/dL (ref 0.3–1.2)

## 2010-05-30 LAB — HM PAP SMEAR
HM Pap smear: NEGATIVE
HM Pap smear: NEGATIVE

## 2010-05-30 MED ORDER — OLMESARTAN MEDOXOMIL-HCTZ 40-25 MG PO TABS: 1.0000 | ORAL_TABLET | Freq: Every day | ORAL | Status: DC

## 2010-05-30 MED ORDER — LEVOTHYROXINE SODIUM 137 MCG PO TABS: 137.0000 ug | ORAL_TABLET | Freq: Every day | ORAL | Status: DC

## 2010-05-30 NOTE — Patient Instructions (Signed)
Please check your blood pressures and mail or fax results to [place].  If BP's are consistently running over 130/80, then I likely will want you to follow up for adjustment in your medications.

## 2010-05-30 NOTE — Progress Notes (Signed)
Subjective:    Patient ID: Ann Weiss, female    DOB: 09/28/1950, 61 y.o.   MRN: [id number]  HPI Ann Weiss is a 60 y.o. female who presents for a complete physical.  She has the following concerns:  Constipation f/u: Takes Amitiza on an as-needed basis with good results.  Continues to use Miralax at least 3 times/week.  Constipation is improved overall.  Diabetes follow-up:  Blood sugars at home are running <120.  Denies hypoglycemia.  Denies polydipsia and polyuria.  Last eye exam was January 2012.  Patient follows a low sugar diet and checks feet regularly without concerns.  She has seen Dr. Juleen China for her diabetes and thyroid.  She stopped taking the Januvia about 6 months ago and he was aware of this. Sugars are <120. She now states that she has "switched out" of that practice, and only plans to see me.  Records have been sent and reviewed from his office.  Hypertension follow-up:  Blood pressures aren't checked elsewhere.  Denies dizziness, headaches, chest pain.  Denies side effects of medications. Doesn't add salt to foods  There is no immunization history on file for this patient.  Pt states last tetanus was within the last 5 years.  I couldn't find documentation in Dr. Marylen Ponto notes--she will check with their office. Also will check with them regarding pneumovax--doesn't recall getting.    Gets flu shots annually. Last Pap smear: 3/08 Last mammogram: 2 years Last colonoscopy:   April--wasn't a good prep; rescheduled for 06/21/2010 with Dr. Kinnie Scales Last DEXA: 01/2010 Eye exam: 01/2010 Dentist: 1 year Exercise: 2x/week  Past Medical History  Diagnosis Date  . Hypertension   . Diabetes mellitus   . Hyperlipidemia   . GERD (gastroesophageal reflux disease)   . Premature menopause age 5  . Constipation   . Vitamin D deficiency 2009  . Thyroid disease 200    hypothyroidism    Past Surgical History  Procedure Date  . Esophagogastroduodenoscopy [id number]  . Colonoscopy  [id number]  . Cesarean section     x 2  . Tubal ligation     History   Social History  . Marital Status: Widowed    Spouse Name: N/A    Number of Children: N/A  . Years of Education: N/A   Occupational History  . Retired from [employer]    Social History Main Topics  . Smoking status: Never Smoker   . Smokeless tobacco: Never Used  . Alcohol Use: Yes     socially maybe 1-2 times monthly  . Drug Use: No  . Sexually Active: Not Currently   Other Topics Concern  . Not on file   Social History Narrative  . No narrative on file    Family History  Problem Relation Age of Onset  . Hypertension Mother   . Kidney disease Mother     renal failure, dialysis  . Hypertension Father   . Stroke Father   . Arthritis Maternal Aunt   . Heart disease Maternal Aunt   . Diabetes Maternal Grandmother   . Cancer Neg Hx     Current outpatient prescriptions:amLODipine (NORVASC) 10 MG tablet, Take 10 mg by mouth daily.  , Disp: , Rfl: ;  aspirin 81 MG tablet, Take 81 mg by mouth daily.  , Disp: , Rfl: ;  ezetimibe-simvastatin (VYTORIN) 10-40 MG per tablet, Take 1 tablet by mouth at bedtime.  , Disp: , Rfl: ;  glyBURIDE-metformin (GLUCOVANCE) 2.5-500 MG per tablet, , Disp: , Rfl:  levothyroxine (SYNTHROID, LEVOTHROID) 137 MCG tablet, Take 137 mcg by mouth daily., Disp: 28 tablet, Rfl: 0;  lubiprostone (AMITIZA) 24 MCG capsule, Take 24 mcg by mouth 2 (two) times daily with a meal.  , Disp: , Rfl: ;  multivitamin (THERAGRAN) per tablet, Take 1 tablet by mouth daily.  , Disp: , Rfl: ;  olmesartan-hydrochlorothiazide (BENICAR HCT) 40-25 MG per tablet, Take 1 tablet by mouth daily., Disp: 28 tablet, Rfl: 0 omeprazole (PRILOSEC) 20 MG capsule, Take 20 mg by mouth daily.  , Disp: , Rfl: ;  polyethylene glycol (MIRALAX / GLYCOLAX) packet, Take 17 g by mouth as needed.  , Disp: , Rfl: ;  DISCONTD: GLYBURIDE PO, Take 500 mg by mouth 2 (two) times daily.  , Disp: , Rfl: ;  DISCONTD: levothyroxine (SYNTHROID,  LEVOTHROID) 137 MCG tablet, Take 137 mcg by mouth daily.  , Disp: , Rfl:  DISCONTD: olmesartan-hydrochlorothiazide (BENICAR HCT) 40-25 MG per tablet, Take 1 tablet by mouth daily.  , Disp: , Rfl: ;  sitaGLIPtan (JANUVIA) 100 MG tablet, Take 100 mg by mouth daily.  , Disp: , Rfl:   No Known Allergies  Review of Systems The patient denies anorexia, fever, weight changes, headaches,  vision changes, decreased hearing, ear pain, sore throat, breast concerns, chest pain, palpitations, dizziness, syncope, dyspnea on exertion, cough, swelling, nausea, vomiting, diarrhea, constipation, abdominal pain, melena, hematochezia, indigestion/heartburn, hematuria, incontinence, dysuria, irregular menstrual cycles, vaginal discharge, odor or itch, genital lesions, joint pains, numbness, tingling, weakness, tremor, suspicious skin lesions, depression, anxiety, abnormal bleeding/bruising, or enlarged lymph nodes.  +ROS: mild allergies and slight cough (occasional). +Fatigue, occasional trouble sleeping, but doing well over the last week    Objective:   Physical Exam BP 152/86  Pulse 68  Ht 5\' 6"  (1.676 m)  Wt 224 lb (101.606 kg)  BMI 36.15 kg/m2  General Appearance:    Alert, cooperative, no distress, appears stated age  Head:    Normocephalic, without obvious abnormality, atraumatic  Eyes:    PERRL, conjunctiva/corneas clear, EOM's intact, fundi    benign  Ears:    Normal TM's and external ear canals  Nose:   Nares normal, mucosa normal, no drainage or sinus   tenderness  Throat:   Lips, mucosa, and tongue normal; teeth and gums normal  Neck:   Supple, no lymphadenopathy;  thyroid: enlarged, symmetric. No dominant mass no carotid bruit or JVD  Back:    Spine nontender, no curvature, ROM normal, no CVA     tenderness  Lungs:     Clear to auscultation bilaterally without wheezes, rales or     ronchi; respirations unlabored  Chest Wall:    No tenderness or deformity   Heart:    Regular rate and rhythm, S1  and S2 normal, no murmur, rub   or gallop  Breast Exam:    No tenderness, masses, or nipple discharge or inversion.      No axillary lymphadenopathy  Abdomen:     Soft, non-tender, nondistended, normoactive bowel sounds,    no masses, no hepatosplenomegaly  Genitalia:    Normal external genitalia without lesions.  BUS and vagina normal; cervix without lesions, or cervical motion tenderness. Small amount of yellow vaginal discharge, no odor.  Uterus and adnexa not enlarged, nontender, no masses.  Pap performed  Rectal:    Normal tone, no masses or tenderness; guaiac negative stool. Slightly thickened skin medially at R gluteal fold, but no warmth, erythema or tenderness  Extremities:   No clubbing,  cyanosis or edema  Pulses:   2+ and symmetric all extremities  Skin:   Skin color, texture, turgor normal, no rashes or lesions. Some hyperpigmentation at c-section scar  Lymph nodes:   Cervical, supraclavicular, and axillary nodes normal  Neurologic:   CNII-XII intact, normal strength, sensation and gait; reflexes 2+ and symmetric throughout          Psych:   Normal mood, affect, hygiene and grooming.        Assessment & Plan:  Discussed monthly self breast exams and yearly mammograms after the age of 21; at least 30 minutes of aerobic activity at least 5 days/week; proper sunscreen use reviewed; healthy diet, including goals of calcium and vitamin D intake and alcohol recommendations (less than or equal to 1 drink/day) reviewed; regular seatbelt use; changing batteries in smoke detectors.  Immunization recommendations discussed.  Colonoscopy recommendations reviewed    1. Routine general medical examination at a health care facility  POCT urinalysis dipstick, Visual acuity screening, Cytology - PAP  2. Essential hypertension, benign  olmesartan-hydrochlorothiazide (BENICAR HCT) 40-25 MG per tablet   BP elevated today, but hasn't taken meds yet.  To start checking BP elsewhere and  calling/mailing/faxing her results  3. Pure hypercholesterolemia  Lipid panel  4. Type II or unspecified type diabetes mellitus without mention of complication, not stated as uncontrolled  Comprehensive metabolic panel, Urine Microalbumin w/creat. ratio, Hemoglobin A1c  5. Unspecified hypothyroidism  TSH, levothyroxine (SYNTHROID, LEVOTHROID) 137 MCG tablet  6. Fatigue  CBC with Differential  7. Hematuria  Urine Culture   abnormalities on u/a may be contamination from vaginal discharge. Will send for culture to r/o infection   Pt to check BP elsewhere.  Unknown if BP is well controlled and elevated due to not taking meds yet today, or if meds need to be adjusted.  Discussed monthly self breast exams and yearly mammograms after the age of 27; at least 30 minutes of aerobic activity at least 5 days/week; proper sunscreen use reviewed; healthy diet, including goals of calcium and vitamin D intake and alcohol recommendations (less than or equal to 1 drink/day) reviewed; regular seatbelt use; changing batteries in smoke detectors.  Immunization recommendations discussed--to check on pneumovax and Td dates.  Set up NV if not UTD.  Colonoscopy recommendations reviewed

## 2010-05-31 ENCOUNTER — Encounter: Payer: Self-pay | Admitting: Family Medicine

## 2010-05-31 LAB — MICROALBUMIN / CREATININE URINE RATIO
Creatinine, Urine: 175.5 mg/dL
Microalb, Ur: 4.93 mg/dL — ABNORMAL HIGH (ref 0.00–1.89)

## 2010-05-31 LAB — TSH: TSH: 2.692 u[IU]/mL (ref 0.350–4.500)

## 2010-06-01 ENCOUNTER — Telehealth: Payer: Self-pay | Admitting: *Deleted

## 2010-06-01 ENCOUNTER — Encounter: Payer: Self-pay | Admitting: Family Medicine

## 2010-06-01 DIAGNOSIS — E782 Mixed hyperlipidemia: Secondary | ICD-10-CM

## 2010-06-01 LAB — URINE CULTURE: Colony Count: 25000

## 2010-06-01 MED ORDER — EZETIMIBE-SIMVASTATIN 10-40 MG PO TABS: 1.0000 | ORAL_TABLET | Freq: Every day | ORAL | Status: DC

## 2010-06-01 NOTE — Telephone Encounter (Signed)
Spoke with patient and gave her all lab results. Patient stated that she has not been taking vytorin 10/40 regularly, if at all. She will resume taking and schedule appt for fasting labs lipid/liver in 2 months, she could not commit to an appt today but will call after her second month of vytorin gets low. I called in vytorin 10/40 #30 once daily with 1 refill as patient was out of this med. Called to [facility] [address]

## 2010-08-08 ENCOUNTER — Other Ambulatory Visit: Payer: Self-pay | Admitting: *Deleted

## 2010-08-08 ENCOUNTER — Encounter: Payer: Self-pay | Admitting: Family Medicine

## 2010-08-08 ENCOUNTER — Ambulatory Visit (INDEPENDENT_AMBULATORY_CARE_PROVIDER_SITE_OTHER): Payer: Federal, State, Local not specified - PPO | Admitting: Family Medicine

## 2010-08-08 VITALS — BP 164/84 | HR 76 | Ht 66.0 in | Wt 224.0 lb

## 2010-08-08 DIAGNOSIS — I10 Essential (primary) hypertension: Secondary | ICD-10-CM

## 2010-08-08 DIAGNOSIS — E782 Mixed hyperlipidemia: Secondary | ICD-10-CM

## 2010-08-08 DIAGNOSIS — M5416 Radiculopathy, lumbar region: Secondary | ICD-10-CM

## 2010-08-08 DIAGNOSIS — IMO0002 Reserved for concepts with insufficient information to code with codable children: Secondary | ICD-10-CM

## 2010-08-08 MED ORDER — EZETIMIBE-SIMVASTATIN 10-40 MG PO TABS: 1.0000 | ORAL_TABLET | Freq: Every day | ORAL | Status: DC

## 2010-08-08 MED ORDER — OLMESARTAN MEDOXOMIL-HCTZ 40-25 MG PO TABS: 1.0000 | ORAL_TABLET | Freq: Every day | ORAL | Status: DC

## 2010-08-08 NOTE — Progress Notes (Signed)
Patient presents for evaluation of right hip pain. Pain radiates down lateral leg, stops about 1/3rd of the way down the lower leg.  Pain has been ongoing x 2 years.  Pt states she has had xrays that showed arthritis (she believes x-ray was of hip). Pain has been worse and more frequent over the past 2 weeks.  Denies any back pain, but slight discomfort in her R lower back at the same time the pain shoots to the hip and leg.  She subsequently denied any "pain", but instead reports it as a "discomfort".  Denies numbness, tingling in her R leg.  States that she has had some weakness in her R leg (as well as her R upper extremity) since 2003, and has had normal MRI's of brain in the past. She has taken Aleve Arthritis, but only once. Only bothers her at night.  Pain seems to come on at 4 am, possibly triggered by her body being cold.  Lasts about 20 minutes, self-resolves, but may recur later.  Doesn't notice the pain during the day, only at night-time. She has been packing up a 3 bedroom house, as she is planning on moving in with her daughter in [place].  Putting her things in storage, so she can come back after a trial period if things aren't working out.  She has been under stress related to the move.   HTN--Not checking BP elsewhere.  She is in the midst of moving.  Hasn't missed any medications; just been very stressed.  Denies any discomfort or pain currently.  Denies headaches, chest pain, SOB.  Past Medical History  Diagnosis Date  . Hypertension   . Diabetes mellitus   . Hyperlipidemia   . GERD (gastroesophageal reflux disease)   . Premature menopause age 43  . Constipation   . Vitamin D deficiency 2009  . Thyroid disease 200    hypothyroidism    Past Surgical History  Procedure Date  . Esophagogastroduodenoscopy [id number]  . Colonoscopy [id number]  . Cesarean section     x 2  . Tubal ligation     History   Social History  . Marital Status: Widowed    Spouse Name: N/A    Number of  Children: N/A  . Years of Education: N/A   Occupational History  . Retired from [employer]    Social History Main Topics  . Smoking status: Never Smoker   . Smokeless tobacco: Never Used  . Alcohol Use: Yes     socially maybe 1-2 times monthly  . Drug Use: No  . Sexually Active: Not Currently   Other Topics Concern  . Not on file   Social History Narrative  . No narrative on file    Family History  Problem Relation Age of Onset  . Hypertension Mother   . Kidney disease Mother     renal failure, dialysis  . Hypertension Father   . Stroke Father   . Arthritis Maternal Aunt   . Heart disease Maternal Aunt   . Diabetes Maternal Grandmother   . Cancer Neg Hx     Current outpatient prescriptions:amLODipine (NORVASC) 10 MG tablet, Take 10 mg by mouth daily.  , Disp: , Rfl: ;  aspirin 81 MG tablet, Take 81 mg by mouth daily.  , Disp: , Rfl: ;  glyBURIDE-metformin (GLUCOVANCE) 2.5-500 MG per tablet, , Disp: , Rfl: ;  levothyroxine (SYNTHROID, LEVOTHROID) 137 MCG tablet, Take 137 mcg by mouth daily., Disp: 28 tablet, Rfl: 0;  lubiprostone (AMITIZA)  24 MCG capsule, Take 24 mcg by mouth as needed. , Disp: , Rfl:  Multiple Vitamins-Minerals (CENTRUM SILVER PO), Take 1 tablet by mouth daily.  , Disp: , Rfl: ;  omeprazole (PRILOSEC) 20 MG capsule, Take 20 mg by mouth daily.  , Disp: , Rfl: ;  polyethylene glycol (MIRALAX / GLYCOLAX) packet, Take 17 g by mouth as needed.  , Disp: , Rfl: ;  ezetimibe-simvastatin (VYTORIN) 10-40 MG per tablet, Take 1 tablet by mouth at bedtime., Disp: 28 tablet, Rfl: 0 olmesartan-hydrochlorothiazide (BENICAR HCT) 40-25 MG per tablet, Take 1 tablet by mouth daily., Disp: 28 tablet, Rfl: 0  No Known Allergies  ROS:  Denies fevers, URI symptoms, GI complaints, numbness, tingling, skin rash, chest pain, SOB or other concerns.  See HPI  PHYSICAL EXAM: BP 164/84  Pulse 76  Ht 5\' 6"  (1.676 m)  Wt 224 lb (101.606 kg)  BMI 36.15 kg/m2 Pleasant obese female in no  distress.  Heart: regular rate and rhythm Lungs: clear bilaterally Extremities: no edema Back:  No CVA tenderness, spinal tenderness.  Nontender at [facility] joint, sciatic notch.  No tenderness at trochanteric bursa Neuro: alert and oriented.  DTR's 2+ at knees, diminished symmetrically at ankles.  Normal strength and sensation  ASSESSMENT/PLAN: 1. Lumbar radiculopathy   2. Essential hypertension, benign    Suspect L5 radiculopathy. Doubt bursitis, but will see if tender/symptomatic when sleeping on right side  Recommend taking Aleve after dinner.  If still having pain, increase aleve to twice daily.  If not helping, can increase to 2 aleve twice daily (prescription equivalent) for up to 2 weeks. Chiropractor may help.  If not improving, consider physical therapy.  Follow-up in 2-3 weeks if not better.  HTN--elevated today, improved but still high on re-check.  Pt is to check BP elsewhere (goal <130/80). Limit sodium in diet, and try and get 30 minutes of exercise daily. Try some stress reduction techniques (exercise, deep breathing, yoga, meditation Follow up sooner than November if BP's consistently above goal.  May need to establish with new physician in [place] for f/u

## 2010-08-08 NOTE — Patient Instructions (Addendum)
Check BP elsewhere (goal <130/80). Limit sodium in diet, and try and get 30 minutes of exercise daily. Try some stress reduction techniques (exercise, deep breathing, yoga, meditation)  Hip pain:  Try taking Aleve after dinner.  If still having pain, increase aleve to twice daily. Make sure to take it with food, and don't take other anti-inflammatories with it.  Okay to use Tylenol along with Aleve.   If not helping, can increase to 2 aleve twice daily (prescription equivalent) for up to 2 weeks. Chiropractor may help.  If not improving, may need to consider physical therapy.  If numbness/tingling and worsening weakness develops, need follow-up, and likely will need MRI.  Follow-up in 2-3 weeks if not better.

## 2010-09-13 ENCOUNTER — Telehealth: Payer: Self-pay | Admitting: Family Medicine

## 2010-09-13 DIAGNOSIS — I10 Essential (primary) hypertension: Secondary | ICD-10-CM

## 2010-09-13 MED ORDER — OLMESARTAN MEDOXOMIL-HCTZ 40-25 MG PO TABS: 1.0000 | ORAL_TABLET | Freq: Every day | ORAL | Status: DC

## 2010-09-13 NOTE — Telephone Encounter (Signed)
Done

## 2010-09-13 NOTE — Telephone Encounter (Signed)
Wants refill Benicar to [facility]  [address], [place]  [place] .  Pt out of town

## 2010-09-26 ENCOUNTER — Ambulatory Visit (INDEPENDENT_AMBULATORY_CARE_PROVIDER_SITE_OTHER): Payer: Federal, State, Local not specified - PPO | Admitting: Family Medicine

## 2010-09-26 ENCOUNTER — Encounter: Payer: Self-pay | Admitting: Family Medicine

## 2010-09-26 DIAGNOSIS — M6281 Muscle weakness (generalized): Secondary | ICD-10-CM

## 2010-09-26 DIAGNOSIS — I10 Essential (primary) hypertension: Secondary | ICD-10-CM

## 2010-09-26 DIAGNOSIS — R531 Weakness: Secondary | ICD-10-CM

## 2010-09-26 DIAGNOSIS — E119 Type 2 diabetes mellitus without complications: Secondary | ICD-10-CM

## 2010-09-26 MED ORDER — METFORMIN HCL 500 MG PO TABS: 500.0000 mg | ORAL_TABLET | Freq: Two times a day (BID) | ORAL | Status: DC

## 2010-09-26 NOTE — Progress Notes (Signed)
Patient presents with complaint of her sugars being up and down x 1 month. Patient states that she has always had a right sided weakness, has worsened in the last month. Having a hard time balancing. Pt states she just doesn't feel well.  Taking an OTC med for chest congestion.  Cough is dry, not having any mucus from nose.  She has been sick on and off every since she moved to [place], having been to the UC and ER and treated for sinus infections.  Congestion is improved, just some residual cough.  Not exercising as much due to recent illness.  HTN--checked a few times while in [place], running 150/80.  Denies headache, light headedness, chest pain, SOB.  DM--sugars have been up and down.  This morning was 190's (1 hour after eating), and 80's in the afternoon.  Denies hypoglycemia  Reports she just doesn't feel right--Feels like she's "walking in the clouds"  Having some balance issues, notices with walking.  Has had R sided weakness since 2003 (during husband's illness, when under a lot of stress).  She thinks the balance issue is related to the weakness (not due to vertigo or recent ENT issues), and wonders if the fluctuating blood sugars could also contribute. Saw neuro in the past, had multiple MRI's of the brain, was never really told any definitive diagnosis, but patient suspects a mini-stroke.  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  R sided leg/hip pain is improved, intermittent  Past Medical History  Diagnosis Date  . Hypertension   . Diabetes mellitus   . Hyperlipidemia   . GERD (gastroesophageal reflux disease)   . Premature menopause age 60  . Constipation   . Vitamin D deficiency 2009  . Thyroid disease 200    hypothyroidism    Past Surgical History  Procedure Date  .  Esophagogastroduodenoscopy [id number]  . Colonoscopy [id number]  . Cesarean section     x 2  . Tubal ligation     History   Social History  . Marital Status: Widowed    Spouse Name: N/A    Number of Children: N/A  . Years of Education: N/A   Occupational History  . Retired from [employer]    Social History Main Topics  . Smoking status: Never Smoker   . Smokeless tobacco: Never Used  . Alcohol Use: Yes     socially maybe 1-2 times monthly  . Drug Use: No  . Sexually Active: Not Currently   Other Topics Concern  . Not on file   Social History Narrative   Moved to [place] with daughter, temporarily (trying it out)    Family History  Problem Relation Age of Onset  . Hypertension Mother   . Kidney disease Mother     renal failure, dialysis  . Hypertension Father   . Stroke Father   . Arthritis Maternal Aunt   . Heart disease Maternal Aunt   . Diabetes Maternal Grandmother   . Cancer Neg Hx    Current Outpatient Prescriptions on File Prior to Visit  Medication Sig Dispense Refill  . amLODipine (NORVASC) 10 MG tablet Take 10 mg by mouth daily.        Marland Kitchen aspirin 81 MG tablet Take 81 mg by mouth daily.        Marland Kitchen ezetimibe-simvastatin (VYTORIN) 10-40  MG per tablet Take 1 tablet by mouth at bedtime.  28 tablet  0  . levothyroxine (SYNTHROID, LEVOTHROID) 137 MCG tablet Take 137 mcg by mouth daily.  28 tablet  0  . lubiprostone (AMITIZA) 24 MCG capsule Take 24 mcg by mouth as needed.       . Multiple Vitamins-Minerals (CENTRUM SILVER PO) Take 1 tablet by mouth daily.        Marland Kitchen olmesartan-hydrochlorothiazide (BENICAR HCT) 40-25 MG per tablet Take 1 tablet by mouth daily.  30 tablet  2  . omeprazole (PRILOSEC) 20 MG capsule Take 20 mg by mouth daily.        . polyethylene glycol (MIRALAX / GLYCOLAX) packet Take 17 g by mouth as needed.          No Known Allergies  ROS:  See HPI.  Denies fevers.  +residual cough.  Denies headache, chest pain, SOB, GI complaints. +R sided weakness and  balance problems.    PHYSICAL EXAM: BP 130/76  Pulse 84  Temp(Src) 98.1 F (36.7 C) (Oral)  Ht 5\' 6"  (1.676 m)  Wt 221 lb (100.245 kg)  BMI 35.67 kg/m2 Pleasant female in no distress Neck: no lymphadenopathy Heart: regular rate and rhythm Lungs: clear bilaterally Abdomen: soft, nontender Extremities: no edema Neuro: alert and oriented x 3.  Problems with tandem gait. No pronator drift.  Negative Romberg. Heel and toe walking normal (sometimes toes on L leg touched the ground when walking on heels--I think more related to getting her balance, rather than weakness) DTR's 2+ and symmetric, strength 5/5 throughout, no true weakness seen  ASSESSMENT/PLAN: 1. Essential hypertension, benign    2. Type II or unspecified type diabetes mellitus without mention of complication, not stated as uncontrolled  POCT HgB A1C, metFORMIN (GLUCOPHAGE) 500 MG tablet  3. Weakness of right side of body  MR Brain Wo Contrast   Balance abnormality and pt's reported R sided weakness (no significant weakness noted on exam), along with h/o abnormal MRI, last checked 2006.  Check MRI brain without contrast, and f/u with Neuro (either [facility], who has seen her before, or with a neurologist in [place] ER referred her to one, hasn't seen yet)  HTN okay today.  Continue low sodium diet and routine monitoring.  DM--controlled, Will adjust meds to reduce risk of hypoglycemia and even out the sugars.  Change from glucovance to 500mg  BID of glucophage  F/u with neuro after MRI F/u 3 months, sooner prn (either here, or establish with new PCP in [place])

## 2010-09-26 NOTE — Patient Instructions (Signed)
Changing your current diabetes medication to metformin--take with breakfast and dinner.  Call if you can't tolerate it. Have MRI and f/u with [facility] or a neurologist in [place] will let you call them to schedule F/u in December for routine f/u on diabetes, HTN

## 2010-09-27 ENCOUNTER — Other Ambulatory Visit: Payer: Federal, State, Local not specified - PPO

## 2010-09-27 ENCOUNTER — Telehealth: Payer: Self-pay | Admitting: *Deleted

## 2010-09-27 DIAGNOSIS — F419 Anxiety disorder, unspecified: Secondary | ICD-10-CM

## 2010-09-27 MED ORDER — ALPRAZOLAM 1 MG PO TABS: 1.0000 mg | ORAL_TABLET | ORAL | Status: DC | PRN

## 2010-09-27 NOTE — Telephone Encounter (Signed)
Xanax 1 mg to take 15-20 minutes prior to appt. #2 (may take additional 1/2 if not getting adequate anti-anxiety effect)

## 2010-09-27 NOTE — Telephone Encounter (Signed)
Patient called and stated that she had an MRI scheduled for this am, she did have to cx due to the fact that she did not have any meds to take. States that something, such as xanax was supposed to be called into [facility] [address] for her MRI. She has rescheduled MRI for tomorrow evening, can you call her in something to the pharmacy please? Thanks.

## 2010-09-27 NOTE — Telephone Encounter (Signed)
Called in Xanax 1mg  #2 , take 1/2-1 15-20 minutes prior to MRI, 1/2 of second pill may be taken if needed. Called into [facility] [phone]. Pt notified.

## 2010-09-28 ENCOUNTER — Ambulatory Visit
Admission: RE | Admit: 2010-09-28 | Discharge: 2010-09-28 | Disposition: A | Discharge status: Home / Self Care | Payer: Federal, State, Local not specified - PPO | Source: Ambulatory Visit | Attending: Family Medicine | Admitting: Family Medicine

## 2010-09-28 DIAGNOSIS — R531 Weakness: Secondary | ICD-10-CM

## 2010-09-28 IMAGING — MR MR HEAD W/O CM
8 series · 34 of 48 positions shown · non-contrast
Comparison: MRI 11/13/2004 and 02/09/2002

CLINICAL DATA: Right-sided weakness.  Follow-up lesion

MRI HEAD WITHOUT CONTRAST
TECHNIQUE: Multiplanar, multiecho pulse sequences of the brain and
surrounding structures were obtained according to standard protocol
without intravenous contrast.

[Series 2: t1_se_sag · sagittal · 5.0mm · 0.45mm/px · 5 of 19 slices shown]
[im 1/19]
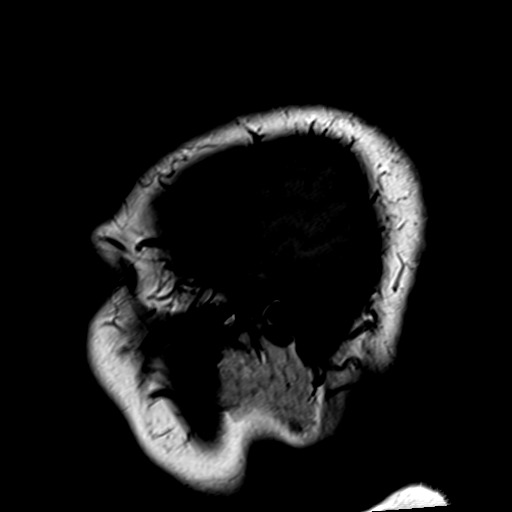
[im 5/19]
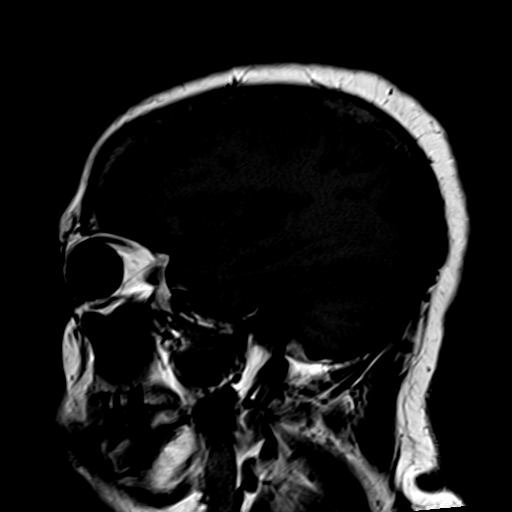
[im 10/19]
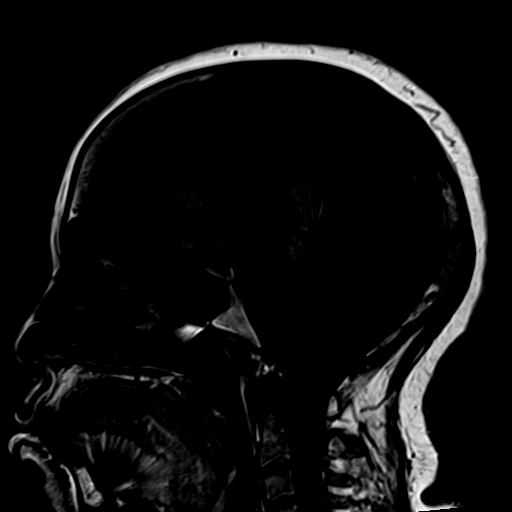
[im 14/19]
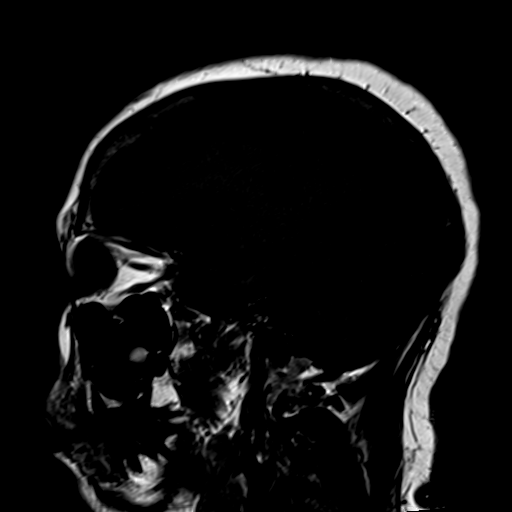
[im 19/19]
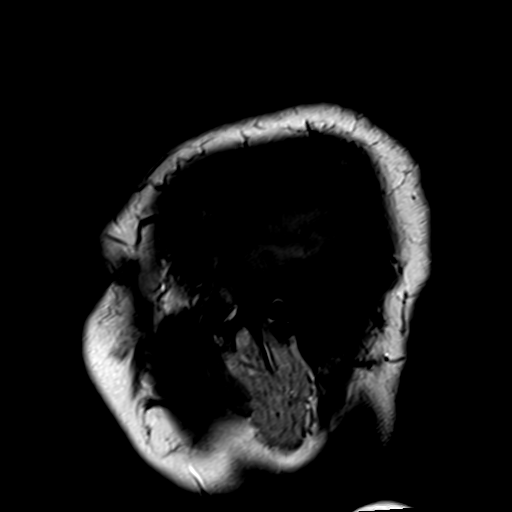

[Series 3: ep2d_diff_(id)_trace · axial · 5.0mm · 1.80mm/px · z∈[-48,+82]mm · 8 of 42 slices shown]
[im 1/42]
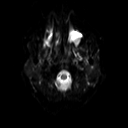
[im 6/42]
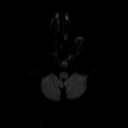
[im 11/42]
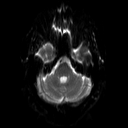
[im 16/42]
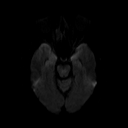
[im 26/42]
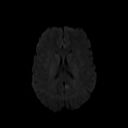
[im 31/42]
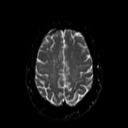
[im 36/42]
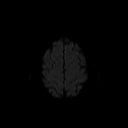
[im 42/42]
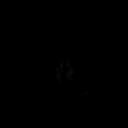

[Series 4: ep2d_diff_(id)_trace_adc · axial · 5.0mm · 1.80mm/px · z∈[-48,+82]mm · 4 of 21 slices shown]
[im 1/21]
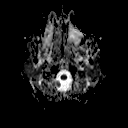
[im 7/21]
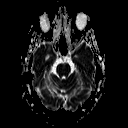
[im 14/21]
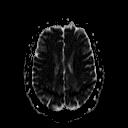
[im 21/21]
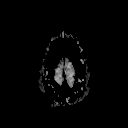

[Series 5: FLAIR · axial · 5.0mm · 0.45mm/px · z∈[-48,+82]mm · 4 of 21 slices shown]
[im 1/21]
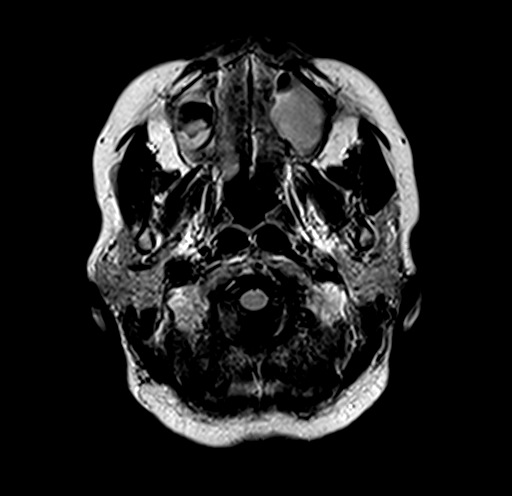
[im 7/21]
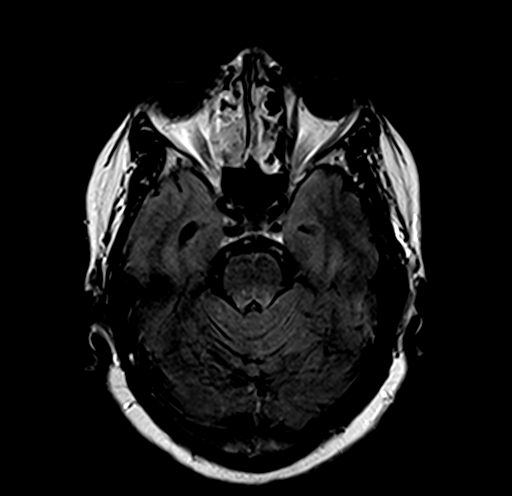
[im 14/21]
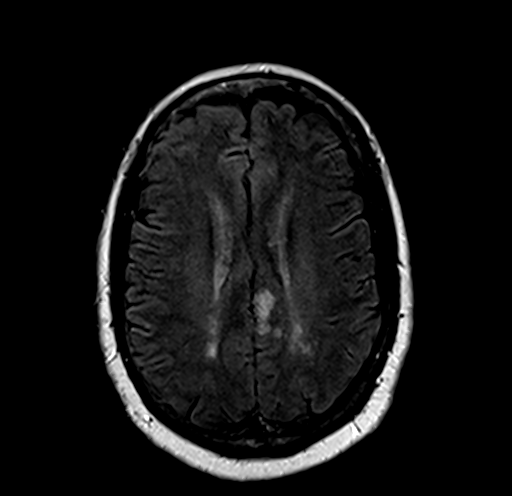
[im 21/21]
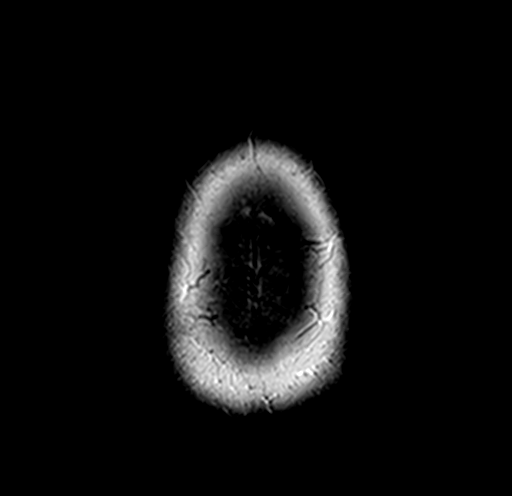

[Series 6: t2_tse_tra_512 · axial · 5.0mm · 0.60mm/px · z∈[-49,+81]mm · 4 of 21 slices shown]
[im 1/21]
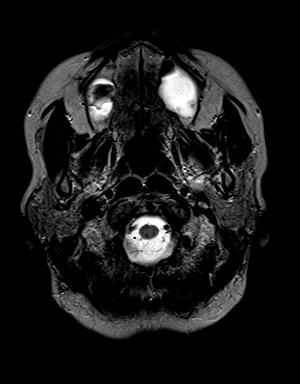
[im 7/21]
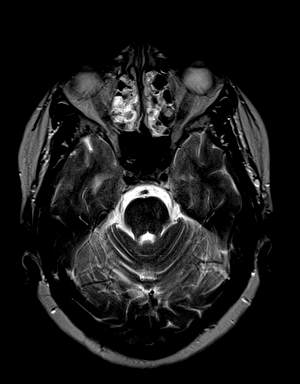
[im 14/21]
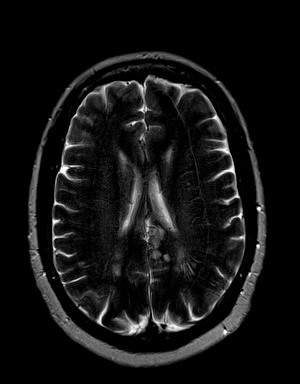
[im 21/21]
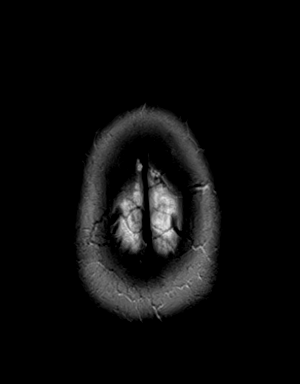

[Series 7: t1_mpr_tra · axial · 2.0mm · 0.45mm/px · 1 of 72 slices shown]
[im 1/72]
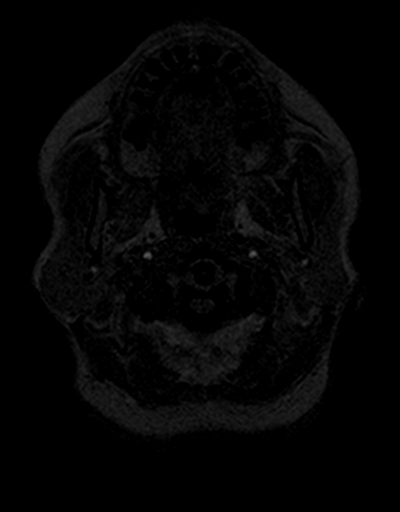

[Series 8: GRE · axial · 5.0mm · 0.45mm/px · z∈[-49,+81]mm · 4 of 21 slices shown]
[im 1/21]
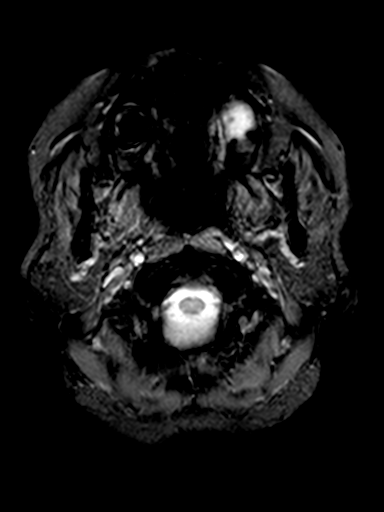
[im 7/21]
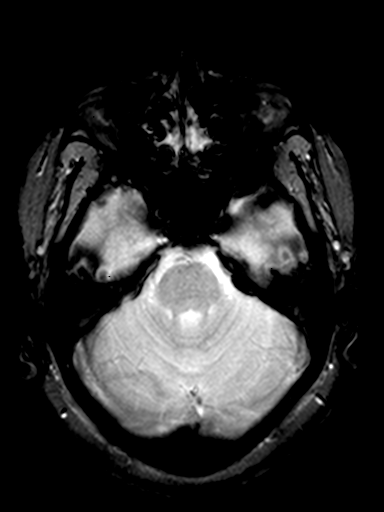
[im 14/21]
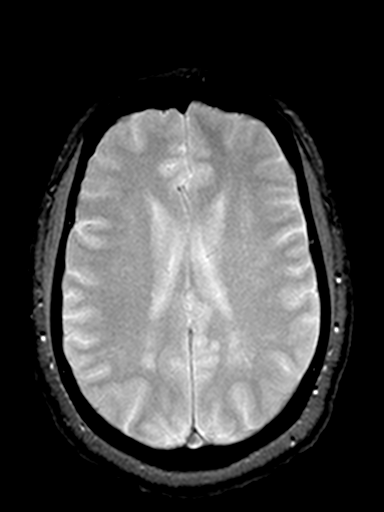
[im 21/21]
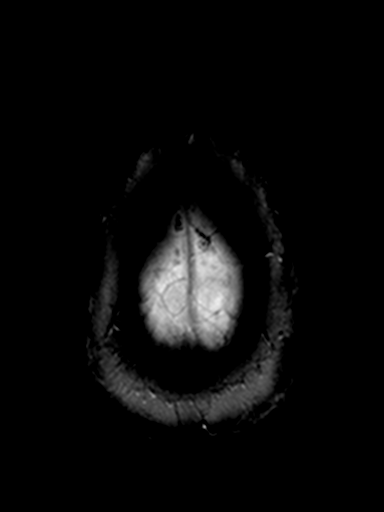

[Series 9: T2 · coronal · 5.0mm · 0.45mm/px · 4 of 22 slices shown]
[im 1/22]
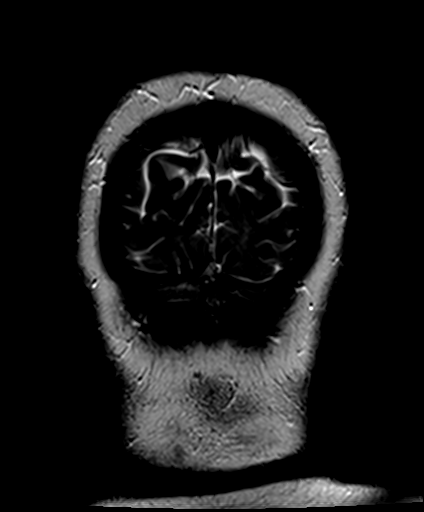
[im 8/22]
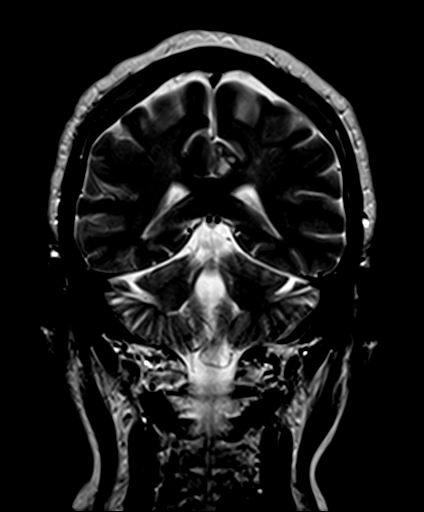
[im 15/22]
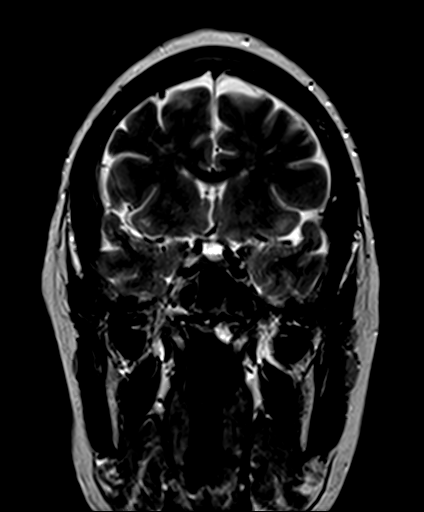
[im 22/22]
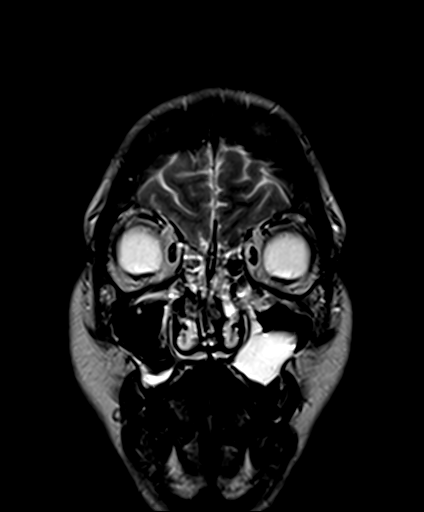

[34 of 48 positions shown; findings below may reference images not displayed]

FINDINGS: Multilobular cystic lesion in the left posterior
pericallosal region of the medial parietal lobe.  This is
unchanged.  There are multiple 1 cm cystic areas which show
increased signal on T2.  No susceptibility artifact. These lesions
show T2 shine through on diffusion weighted imaging without
definite restricted diffusion.  This is similar to the prior study.
No significant interval change.  No surrounding edema in the brain.

Negative for acute infarct.  Patchy hyperintensity in the cerebral
white matter bilaterally, most likely related to chronic
microvascular ischemia.  This has progressed in the interval.  This
could also be due to migraine headaches or vasculitis.  Brainstem
and cerebellum are intact.

Moderate chronic sinusitis.
IMPRESSION: Multi locular cystic lesion in the left posterior pericallosal
brain is unchanged.  This shows restricted diffusion.  This may be
an epidermoid or benign cystic neoplasm which is stable.

Progression of chronic white matter changes which may be due to
chronic ischemia, migraine headaches or vasculitis

Chronic sinusitis.

## 2010-10-01 ENCOUNTER — Telehealth: Payer: Self-pay | Admitting: *Deleted

## 2010-10-01 NOTE — Telephone Encounter (Signed)
Spoke with patient and went over MRI results.She is going to follow up with neurologist in [place]. She will call me once scheduled, I will fax records and MRI. She will call [facility] to obtain MRI films if needed.

## 2010-10-01 NOTE — Telephone Encounter (Signed)
Left message for pt to return my call to go over MRI results.

## 2010-10-02 ENCOUNTER — Telehealth: Payer: Self-pay | Admitting: Family Medicine

## 2010-10-02 NOTE — Telephone Encounter (Signed)
Faxed MRI and office notes to Dr.Salles([facility]) @ [phone].

## 2010-11-26 ENCOUNTER — Telehealth: Payer: Self-pay | Admitting: *Deleted

## 2010-11-26 DIAGNOSIS — E039 Hypothyroidism, unspecified: Secondary | ICD-10-CM

## 2010-11-26 MED ORDER — LEVOTHYROXINE SODIUM 137 MCG PO TABS: 137.0000 ug | ORAL_TABLET | Freq: Every day | ORAL | Status: DC

## 2010-11-26 NOTE — Telephone Encounter (Signed)
Left message for patient letting her know that Dr.Knapp ok'd me to change synthroid to generic. Advised her that it may not be exactly the same, and if she starts having symptoms she may need to have her TSH checked. Called in levothyroxine #30 once daily with 3 refills to [facility] in [place], [place] system)

## 2010-11-26 NOTE — Telephone Encounter (Signed)
Ok to change to generic.  Advise that it may not be exactly the same, and if she starts having symptoms, then may need to have TSH checked

## 2010-11-26 NOTE — Telephone Encounter (Signed)
Patient called and wanted to know if you could change her brand name Synthroid to generic so she could save some money. She uses [facility] in [place], [place].

## 2010-12-03 ENCOUNTER — Telehealth: Payer: Self-pay | Admitting: Family Medicine

## 2010-12-03 DIAGNOSIS — E039 Hypothyroidism, unspecified: Secondary | ICD-10-CM

## 2010-12-03 DIAGNOSIS — I10 Essential (primary) hypertension: Secondary | ICD-10-CM

## 2010-12-03 MED ORDER — LEVOTHYROXINE SODIUM 137 MCG PO TABS: 137.0000 ug | ORAL_TABLET | Freq: Every day | ORAL | Status: DC

## 2010-12-03 MED ORDER — OLMESARTAN MEDOXOMIL-HCTZ 40-25 MG PO TABS: 1.0000 | ORAL_TABLET | Freq: Every day | ORAL | Status: DC

## 2010-12-03 NOTE — Telephone Encounter (Signed)
Called patient back and left message that her samples are up front.

## 2010-12-12 ENCOUNTER — Telehealth: Payer: Self-pay | Admitting: *Deleted

## 2010-12-12 NOTE — Telephone Encounter (Signed)
Left message for pt informing her that symptoms could be a sign of dehydration, advised her to increase her fluid intake and if symptoms did not improve with hydration that she may need evaluation.

## 2010-12-12 NOTE — Telephone Encounter (Signed)
Advise--that could be a sign of dehydration (she is on a diuretic)--ensure that she is drinking plenty of fluids.  Not sure if she is here in [place] or [place], but if not improving with hydration, may need eval

## 2010-12-12 NOTE — Telephone Encounter (Signed)
Patient left message on my voicemail asking if I could send you a message, I tried to call her back but there was no answer. She called and wanted to let you know that her mouth has been excessively dry x 1 week, so dry that it is even hard to swallow. Please advise. Thanks.

## 2011-01-21 ENCOUNTER — Telehealth: Payer: Self-pay | Admitting: Family Medicine

## 2011-01-21 MED ORDER — OMEPRAZOLE 20 MG PO CPDR: 20.0000 mg | DELAYED_RELEASE_CAPSULE | Freq: Every day | ORAL | Status: DC

## 2011-01-21 MED ORDER — AMLODIPINE BESYLATE 10 MG PO TABS: 10.0000 mg | ORAL_TABLET | Freq: Every day | ORAL | Status: DC

## 2011-01-21 NOTE — Telephone Encounter (Signed)
Went ahead and refilled patient's meds to hold her until appt that she will schedule with Dr.Knapp in February. Called in 30 days worth of meds, until she scheduled appt.

## 2011-02-14 ENCOUNTER — Encounter: Payer: Self-pay | Admitting: Internal Medicine

## 2011-02-20 ENCOUNTER — Encounter: Payer: Self-pay | Admitting: Family Medicine

## 2011-02-20 ENCOUNTER — Ambulatory Visit (INDEPENDENT_AMBULATORY_CARE_PROVIDER_SITE_OTHER): Payer: Federal, State, Local not specified - PPO | Admitting: Family Medicine

## 2011-02-20 VITALS — BP 158/90 | HR 72 | Ht 66.0 in | Wt 201.0 lb

## 2011-02-20 DIAGNOSIS — IMO0001 Reserved for inherently not codable concepts without codable children: Secondary | ICD-10-CM

## 2011-02-20 DIAGNOSIS — E119 Type 2 diabetes mellitus without complications: Secondary | ICD-10-CM

## 2011-02-20 DIAGNOSIS — R930 Abnormal findings on diagnostic imaging of skull and head, not elsewhere classified: Secondary | ICD-10-CM

## 2011-02-20 DIAGNOSIS — R9089 Other abnormal findings on diagnostic imaging of central nervous system: Secondary | ICD-10-CM

## 2011-02-20 DIAGNOSIS — F329 Major depressive disorder, single episode, unspecified: Secondary | ICD-10-CM | POA: Insufficient documentation

## 2011-02-20 DIAGNOSIS — E7800 Pure hypercholesterolemia, unspecified: Secondary | ICD-10-CM

## 2011-02-20 DIAGNOSIS — E039 Hypothyroidism, unspecified: Secondary | ICD-10-CM

## 2011-02-20 DIAGNOSIS — R5381 Other malaise: Secondary | ICD-10-CM

## 2011-02-20 DIAGNOSIS — F3289 Other specified depressive episodes: Secondary | ICD-10-CM

## 2011-02-20 DIAGNOSIS — E559 Vitamin D deficiency, unspecified: Secondary | ICD-10-CM

## 2011-02-20 DIAGNOSIS — I10 Essential (primary) hypertension: Secondary | ICD-10-CM

## 2011-02-20 LAB — POCT GLYCOSYLATED HEMOGLOBIN (HGB A1C): Hemoglobin A1C: 9.5

## 2011-02-20 MED ORDER — OMEPRAZOLE 20 MG PO CPDR: 20.0000 mg | DELAYED_RELEASE_CAPSULE | Freq: Every day | ORAL | Status: DC

## 2011-02-20 MED ORDER — AMLODIPINE BESYLATE 10 MG PO TABS: 10.0000 mg | ORAL_TABLET | Freq: Every day | ORAL | Status: DC

## 2011-02-20 MED ORDER — OLMESARTAN MEDOXOMIL-HCTZ 40-25 MG PO TABS: 1.0000 | ORAL_TABLET | Freq: Every day | ORAL | Status: DC

## 2011-02-20 MED ORDER — METFORMIN HCL 1000 MG PO TABS: 1000.0000 mg | ORAL_TABLET | Freq: Two times a day (BID) | ORAL | Status: DC

## 2011-02-20 NOTE — Patient Instructions (Signed)
Your diabetes is poorly controlled  Your A1c was 9.5 today.  I don't know why A1c so much higher now than in May, on same medications.  Increase metformin to 1000 mg BID. Check sugars more frequently, sometimes in the evening (2 hours after dinner/before bedtime). Return in 1 month with list of sugars.  If still elevated when I see you next month, will need to restart Januvia.    Schedule your annual diabetic eye exam  Copay card given so Benicar HCT shouldn't cost more than $25/month  Depression--discussed counseling. Check with your insurance.  Medications may help, but we can try counseling first if that is what you prefer.  We will refer you to [facility] for you weakness/balance problems and h/o abnormal MRI  Follow a low sodium diet:    2 Gram Low Sodium Diet A 2 gram sodium diet restricts the amount of sodium in the diet to no more than 2 g or 2000 mg daily. Limiting the amount of sodium is often used to help lower blood pressure. It is important if you have heart, liver, or kidney problems. Many foods contain sodium for flavor and sometimes as a preservative. When the amount of sodium in a diet needs to be low, it is important to know what to look for when choosing foods and drinks. The following includes some information and guidelines to help make it easier for you to adapt to a low sodium diet. QUICK TIPS  Do not add salt to food.   Avoid convenience items and fast food.   Choose unsalted snack foods.   Buy lower sodium products, often labeled as "lower sodium" or "no salt added."   Check food labels to learn how much sodium is in 1 serving.   When eating at a restaurant, ask that your food be prepared with less salt or none, if possible.  READING FOOD LABELS FOR SODIUM INFORMATION The nutrition facts label is a good place to find how much sodium is in foods. Look for products with no more than 500 to 600 mg of sodium per meal and no more than 150 mg per serving. Remember  that 2 g = 2000 mg. The food label may also list foods as:  Sodium-free: Less than 5 mg in a serving.   Very low sodium: 35 mg or less in a serving.   Low-sodium: 140 mg or less in a serving.   Light in sodium: 50% less sodium in a serving. For example, if a food that usually has 300 mg of sodium is changed to become light in sodium, it will have 150 mg of sodium.   Reduced sodium: 25% less sodium in a serving. For example, if a food that usually has 400 mg of sodium is changed to reduced sodium, it will have 300 mg of sodium.  CHOOSING FOODS Grains  Avoid: Salted crackers and snack items. Some cereals, including instant hot cereals. Bread stuffing and biscuit mixes. Seasoned rice or pasta mixes.   Choose: Unsalted snack items. Low-sodium cereals, oats, puffed wheat and rice, shredded wheat. English muffins and bread. Pasta.  Meats  Avoid: Salted, canned, smoked, spiced, pickled meats, including fish and poultry. Bacon, ham, sausage, cold cuts, hot dogs, anchovies.   Choose: Low-sodium canned tuna and salmon. Fresh or frozen meat, poultry, and fish.  Dairy  Avoid: Processed cheese and spreads. Cottage cheese. Buttermilk and condensed milk. Regular cheese.   Choose: Milk. Low-sodium cottage cheese. Yogurt. Sour cream. Low-sodium cheese.  Fruits and  Vegetables  Avoid: Regular canned vegetables. Regular canned tomato sauce and paste. Frozen vegetables in sauces. Olives. Rosita Fire. Relishes. Sauerkraut.   Choose: Low-sodium canned vegetables. Low-sodium tomato sauce and paste. Frozen or fresh vegetables. Fresh and frozen fruit.  Condiments  Avoid: Canned and packaged gravies. Worcestershire sauce. Tartar sauce. Barbecue sauce. Soy sauce. Steak sauce. Ketchup. Onion, garlic, and table salt. Meat flavorings and tenderizers.   Choose: Fresh and dried herbs and spices. Low-sodium varieties of mustard and ketchup. Lemon juice. Tabasco sauce. Horseradish.  SAMPLE 2 GRAM SODIUM MEAL  PLAN Breakfast / Sodium (mg)  1 cup low-fat milk / 143 mg   2 slices whole-wheat toast / 270 mg   1 tbs heart-healthy margarine / 153 mg   1 hard-boiled egg / 139 mg   1 small orange / 0 mg  Lunch / Sodium (mg)  1 cup raw carrots / 76 mg    cup hummus / 298 mg   1 cup low-fat milk / 143 mg    cup red grapes / 2 mg   1 whole-wheat pita bread / 356 mg  Dinner / Sodium (mg)  1 cup whole-wheat pasta / 2 mg   1 cup low-sodium tomato sauce / 73 mg   3 oz lean ground beef / 57 mg   1 small side salad (1 cup raw spinach leaves,  cup cucumber,  cup yellow bell pepper) with 1 tsp olive oil and 1 tsp red wine vinegar / 25 mg  Snack / Sodium (mg)  1 container low-fat vanilla yogurt / 107 mg   3 graham cracker squares / 127 mg  Nutrient Analysis  Calories: 2033   Protein: 77 g   Carbohydrate: 282 g   Fat: 72 g   Sodium: 1971 mg  Document Released: 12/24/2004 Document Revised: 09/05/2010 Document Reviewed: 03/27/2009 [facility] Patient Information 2012 [place], [place].

## 2011-02-20 NOTE — Progress Notes (Signed)
Chief complaint: 5 month follow up. In the last 5 months pt states her health is declining. Right sided weakness has gotten worse, short term memory being affected. Feels tired and depressed  HPI: Patient states her health has been declining since the day after she moved to [place].   Having increased weakness in the right side, worsening short term memory.  Feeling more fatigued, depressed.  Dropped a pie trying to lift it out of the stove.  Wants another MRI.  She said it was recommended that she get MRI with contrast (and needing sedation), by the neurologist she saw in [place].  Feeling depressed--some financial stressors, physical health is stressing her out.  Never took meds in past for depression.  Occasionally cries, some hopelessness.  Rare thoughts of suicide, no plan.    Diabetes follow-up:  Blood sugars at home are running 120-150 fasting, not checking later in the day.  Denies hypoglycemia.  Denies polydipsia and polyuria.  Last eye exam was 01/2010.  Having some trouble with her peripheral vision.  Eats twice daily (Rice Krispies or Special K cereal), 1/2 glass OJ for morning, then 5pm dinner--+pasta, some sweets.  She doesn't do the cooking, so she eats whatever is prepared.  Currently she is living with her daughter--she JUST moved back to [place]. She checks feet regularly without concerns. Went to Urgent care a few weeks ago with pressure in lower calf, u/s negative for DVT. She was told she had some edema, but patient denies any swelling.  Hypertension follow-up:  Blood pressures elsewhere are 132-140/80-82 at the pharmacy when she was taking all of her meds.  Denies dizziness, headaches, chest pain, palpitations.  Denies side effects of medications, denies cough.  Has been out of her Benicar HCT for about a week.  Didn't get it refilled due to cost. Wondering about a lower cost medication.  Not using a copay card.  Was walking approx 30 minutes about 3x/week.   Hyperlipidemia follow-up:  Patient  is reportedly following a low-fat, low cholesterol diet.  Compliant with medications and denies medication side effects  Hypothyroidism--denies any missed pills. +weight loss.  Mild, chronic constipation, denies palpitations. +fatigue. +trouble concentrating.  Sleeping fine at night  Past Medical History  Diagnosis Date  . Hypertension   . Diabetes mellitus   . Hyperlipidemia   . GERD (gastroesophageal reflux disease)   . Premature menopause age 85  . Constipation   . Vitamin d deficiency 2009  . Thyroid disease 200    hypothyroidism    Past Surgical History  Procedure Date  . Esophagogastroduodenoscopy [id number]  . Colonoscopy [id number]  . Cesarean section     x 2  . Tubal ligation     History   Social History  . Marital Status: Widowed    Spouse Name: N/A    Number of Children: N/A  . Years of Education: N/A   Occupational History  . Retired from [employer]    Social History Main Topics  . Smoking status: Never Smoker   . Smokeless tobacco: Never Used  . Alcohol Use: Yes     socially maybe 1-2 times monthly  . Drug Use: No  . Sexually Active: Not Currently   Other Topics Concern  . Not on file   Social History Narrative   Moved to [place] with daughter, temporarily (trying it out)    Family History  Problem Relation Age of Onset  . Hypertension Mother   . Kidney disease Mother     renal  failure, dialysis  . Hypertension Father   . Stroke Father   . Arthritis Maternal Aunt   . Heart disease Maternal Aunt   . Diabetes Maternal Grandmother   . Cancer Neg Hx    Current Outpatient Prescriptions on File Prior to Visit  Medication Sig Dispense Refill  . aspirin 81 MG tablet Take 81 mg by mouth daily.        Marland Kitchen ezetimibe-simvastatin (VYTORIN) 10-40 MG per tablet Take 1 tablet by mouth at bedtime.  28 tablet  0  . levothyroxine (SYNTHROID, LEVOTHROID) 137 MCG tablet Take 1 tablet (137 mcg total) by mouth daily.  28 tablet  0  . Multiple Vitamins-Minerals (CENTRUM  SILVER PO) Take 1 tablet by mouth daily.        . polyethylene glycol (MIRALAX / GLYCOLAX) packet Take 17 g by mouth as needed.        Marland Kitchen DISCONTD: amLODipine (NORVASC) 10 MG tablet Take 1 tablet (10 mg total) by mouth daily.  30 tablet  0  . DISCONTD: metFORMIN (GLUCOPHAGE) 500 MG tablet Take 1 tablet (500 mg total) by mouth 2 (two) times daily with a meal.  60 tablet  3  . DISCONTD: olmesartan-hydrochlorothiazide (BENICAR HCT) 40-25 MG per tablet Take 1 tablet by mouth daily.  28 tablet  0   No Known Allergies  ROS:  Denies fevers, URI symptoms, headaches, chest pain, shortness of breath, nausea, vomiting, edema, skin lesions. +balance concerns, memory concerns, R sided weakness, depression  PHYSICAL EXAM: BP 158/90  Pulse 72  Ht 5\' 6"  (1.676 m)  Wt 201 lb (91.173 kg)  BMI 32.44 kg/m2 158/84 repeat by MD Well developed, mildly depressed appearing female in no distress Back: no CVA or spinal tenderness Neck: no lymphadenopathy, thyromegaly or mass Heart: regular rate and rhythm without murmur Lungs: clear bilaterally Abdomen: soft, nontender, no organomegaly or mass Extremities: no edema, 2+ pulses. Diabetic foot exam done--discolored but not thickened nails, normal sensation Neuro: alert and oriented.  DTR's symmetric. Normal strength, sensation, gait Psych: mildly depressed.  Normal hygiene, grooming, although wearing a sweater much too big for her.  Normal eye contact and speech Skin: no lesions  Lab Results  Component Value Date   HGBA1C 9.5 02/20/2011   ASSESSMENT/PLAN: 1. Type II or unspecified type diabetes mellitus without mention of complication, uncontrolled    2. Type II or unspecified type diabetes mellitus without mention of complication, not stated as uncontrolled  POCT HgB A1C, Comprehensive metabolic panel, metFORMIN (GLUCOPHAGE) 1000 MG tablet  3. Essential hypertension, benign  Comprehensive metabolic panel, olmesartan-hydrochlorothiazide (BENICAR HCT) 40-25 MG per  tablet, amLODipine (NORVASC) 10 MG tablet  4. Pure hypercholesterolemia  Lipid panel  5. Unspecified hypothyroidism  TSH  6. Essential hypertension, benign  Comprehensive metabolic panel, olmesartan-hydrochlorothiazide (BENICAR HCT) 40-25 MG per tablet, amLODipine (NORVASC) 10 MG tablet  7. Other malaise and fatigue  Vitamin D 25 hydroxy, CBC with Differential  8. Unspecified vitamin D deficiency  Vitamin D 25 hydroxy  9. Depressive disorder, not elsewhere classified     DM--?why A1c so much higher now than in May, on same medications.  Increase metformin to 1000 mg BID. Declines restarting Januvia at this time.  Pt was advised to check sugars more frequently, sometimes in the evening (2 hours after dinner/before bedtime). Return in 1 month with list of sugars.  If still elevated, will need to restart Januvia.  Previously took this, but stopped when sugars were better, and had been off for about 6  months prior to last A1c that was in the 6 range in May.  Patient reminded to schedule annual diabetic eye exam.  HTN--BP elevated, likely due to being out of Benicar HCT.  Copay card given so Benicar HCT shouldn't cost more than $25/month, and samples given.  Depression--discussed counseling. Thinks her insurance covers and she will look into.  Prefers to avoid medications for now.  Refer to [facility] Neuro for problems with ongoing R sided weakness (although no weakness demonstrated on exam), balance problems, memory concerns. Will check TSH to r/o thyroid contributing to her symptoms.  Refer to neuro to f/u on these symptoms and abnormal MRI.   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 This shows restricted diffusion. 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  Progression of chronic white matter changes which may be due to  chronic ischemia, migraine headaches or vasculitis

## 2011-02-27 ENCOUNTER — Telehealth: Payer: Self-pay | Admitting: Family Medicine

## 2011-02-27 NOTE — Telephone Encounter (Signed)
Called patient and let her know that I called over to [facility] to find out status of consult, spoke with Diane their new pt coordinator. She stated that they will be in touch with the patient either today or tomorrow. I told patient to call me if she does not hear anything by Friday and we can try to schedule her elsewhere.

## 2011-03-15 ENCOUNTER — Other Ambulatory Visit: Payer: Self-pay | Admitting: Family Medicine

## 2011-03-15 ENCOUNTER — Other Ambulatory Visit: Payer: Federal, State, Local not specified - PPO

## 2011-03-15 DIAGNOSIS — R5381 Other malaise: Secondary | ICD-10-CM

## 2011-03-15 DIAGNOSIS — I10 Essential (primary) hypertension: Secondary | ICD-10-CM

## 2011-03-15 DIAGNOSIS — E559 Vitamin D deficiency, unspecified: Secondary | ICD-10-CM

## 2011-03-15 DIAGNOSIS — E782 Mixed hyperlipidemia: Secondary | ICD-10-CM

## 2011-03-15 DIAGNOSIS — E7800 Pure hypercholesterolemia, unspecified: Secondary | ICD-10-CM

## 2011-03-15 DIAGNOSIS — E119 Type 2 diabetes mellitus without complications: Secondary | ICD-10-CM

## 2011-03-15 DIAGNOSIS — E039 Hypothyroidism, unspecified: Secondary | ICD-10-CM

## 2011-03-15 MED ORDER — EZETIMIBE-SIMVASTATIN 10-40 MG PO TABS: 1.0000 | ORAL_TABLET | Freq: Every day | ORAL | Status: DC

## 2011-03-16 LAB — COMPREHENSIVE METABOLIC PANEL
ALT: <8 U/L (ref 0–35)
AST: 11 U/L (ref 0–37)
Albumin: 4.1 g/dL (ref 3.5–5.2)
Alkaline Phosphatase: 60 U/L (ref 39–117)
BUN: 14 mg/dL (ref 6–23)
CO2: 28 mEq/L (ref 19–32)
Calcium: 9.6 mg/dL (ref 8.4–10.5)
Chloride: 103 mEq/L (ref 96–112)
Creat: 0.81 mg/dL (ref 0.50–1.10)
Glucose, Bld: 182 mg/dL — ABNORMAL HIGH (ref 70–99)
Potassium: 3.9 mEq/L (ref 3.5–5.3)
Sodium: 140 mEq/L (ref 135–145)
Total Bilirubin: 0.3 mg/dL (ref 0.3–1.2)
Total Protein: 6.7 g/dL (ref 6.0–8.3)

## 2011-03-16 LAB — LIPID PANEL
Cholesterol: 162 mg/dL (ref 0–200)
HDL: 44 mg/dL (ref >39)
LDL Cholesterol: 99 mg/dL (ref 0–99)
Total CHOL/HDL Ratio: 3.7 ratio
Triglycerides: 96 mg/dL (ref <150)
VLDL: 19 mg/dL (ref 0–40)

## 2011-03-16 LAB — CBC WITH DIFFERENTIAL/PLATELET
Basophils Absolute: 0 K/uL (ref 0.0–0.1)
Basophils Relative: 0 % (ref 0–1)
Eosinophils Absolute: 0.4 K/uL (ref 0.0–0.7)
Eosinophils Relative: 5 % (ref 0–5)
HCT: 42.1 % (ref 36.0–46.0)
Hemoglobin: 13.4 g/dL (ref 12.0–15.0)
Lymphocytes Relative: 31 % (ref 12–46)
Lymphs Abs: 2.2 K/uL (ref 0.7–4.0)
MCH: 28.5 pg (ref 26.0–34.0)
MCHC: 31.8 g/dL (ref 30.0–36.0)
MCV: 89.6 fL (ref 78.0–100.0)
Monocytes Absolute: 0.3 K/uL (ref 0.1–1.0)
Monocytes Relative: 4 % (ref 3–12)
Neutro Abs: 4.1 K/uL (ref 1.7–7.7)
Neutrophils Relative %: 59 % (ref 43–77)
Platelets: 218 K/uL (ref 150–400)
RBC: 4.7 MIL/uL (ref 3.87–5.11)
RDW: 12.8 % (ref 11.5–15.5)
WBC: 6.9 K/uL (ref 4.0–10.5)

## 2011-03-16 LAB — VITAMIN D 25 HYDROXY (VIT D DEFICIENCY, FRACTURES): Vit D, 25-Hydroxy: 23 ng/mL — ABNORMAL LOW (ref 30–89)

## 2011-03-16 LAB — TSH: TSH: 0.013 u[IU]/mL — ABNORMAL LOW (ref 0.350–4.500)

## 2011-03-21 ENCOUNTER — Ambulatory Visit: Payer: Federal, State, Local not specified - PPO | Admitting: Family Medicine

## 2011-03-25 ENCOUNTER — Encounter: Payer: Federal, State, Local not specified - PPO | Admitting: Physical Therapy

## 2011-03-25 ENCOUNTER — Ambulatory Visit: Payer: Federal, State, Local not specified - PPO | Admitting: *Deleted

## 2011-03-25 ENCOUNTER — Ambulatory Visit: Payer: Federal, State, Local not specified - PPO | Admitting: Family Medicine

## 2011-03-28 ENCOUNTER — Encounter: Payer: Federal, State, Local not specified - PPO | Admitting: Physical Therapy

## 2011-04-24 ENCOUNTER — Encounter: Payer: Self-pay | Admitting: Family Medicine

## 2011-04-24 ENCOUNTER — Ambulatory Visit (INDEPENDENT_AMBULATORY_CARE_PROVIDER_SITE_OTHER): Payer: Federal, State, Local not specified - PPO | Admitting: Family Medicine

## 2011-04-24 VITALS — BP 134/74 | HR 72 | Ht 66.0 in | Wt 194.0 lb

## 2011-04-24 DIAGNOSIS — E559 Vitamin D deficiency, unspecified: Secondary | ICD-10-CM

## 2011-04-24 DIAGNOSIS — I10 Essential (primary) hypertension: Secondary | ICD-10-CM

## 2011-04-24 DIAGNOSIS — IMO0001 Reserved for inherently not codable concepts without codable children: Secondary | ICD-10-CM

## 2011-04-24 DIAGNOSIS — E039 Hypothyroidism, unspecified: Secondary | ICD-10-CM

## 2011-04-24 DIAGNOSIS — E7800 Pure hypercholesterolemia, unspecified: Secondary | ICD-10-CM

## 2011-04-24 MED ORDER — SAXAGLIPTIN HCL 5 MG PO TABS: 5.0000 mg | ORAL_TABLET | Freq: Every day | ORAL | Status: DC

## 2011-04-24 MED ORDER — VITAMIN D (ERGOCALCIFEROL) 1.25 MG (50000 UNIT) PO CAPS: 50000.0000 [IU] | ORAL_CAPSULE | ORAL | Status: DC

## 2011-04-24 MED ORDER — LEVOTHYROXINE SODIUM 125 MCG PO TABS
125.0000 ug | ORAL_TABLET | Freq: Every day | ORAL | Status: DC
Start: 2011-04-24 — End: 2011-05-20

## 2011-04-24 NOTE — Progress Notes (Signed)
Patient presents for f/u on hypertension and diabetes as well as f/u on labs from last month.  Hypothyroidism:  +fatigue.  Denies skin changes, some hair thinning.  +weight loss, unintentional.  Denies palpitations.  Bowels are normal, going every 3-4 days, normal for her.  Has seen neuro in f/u for abnormal MRI, cysts--has f/u scheduled later this month.  She was put on Cymbalta 30mg , then increased to 60mg .  Had diarrhea, nausea and fatigue, so went off it about 2 weeks ago.  Reportedly had side effects even at 30mg , and there is a plan to change her meds at her f/u visit (doesn't recall to what).    Depression--reports having "good days and bad days", but not as many bad days as before.    Diabetes--past due for yearly eye exam, last had 01/2010.  Saving up money to go, knows the importance of yearly exams.  She is now doing the cooking, and feels like she is eating better--fewer carbs, sweets.  Sugars are averaging 150 (140-160), lowest being 119.  She walks daily for 20-30 minutes, and has been getting PT.   Previously took Actos, but wasn't happy with it.  Hypertension:  BPs at pharmacy are still running about 132-140/80-84.  Denies dizziness, headaches, chest pain, palpitations. +edema last week (higher temps, possibly some sodium--canned foods, processed meats).  Past Medical History  Diagnosis Date  . Hypertension   . Diabetes mellitus   . Hyperlipidemia   . GERD (gastroesophageal reflux disease)   . Premature menopause age 43  . Constipation   . Vitamin d deficiency 2009  . Thyroid disease 200    hypothyroidism    Past Surgical History  Procedure Date  . Esophagogastroduodenoscopy [id number]  . Colonoscopy [id number]  . Cesarean section     x 2  . Tubal ligation     History   Social History  . Marital Status: Widowed    Spouse Name: N/A    Number of Children: N/A  . Years of Education: N/A   Occupational History  . Retired from [employer]    Social History Main Topics  .  Smoking status: Never Smoker   . Smokeless tobacco: Never Used  . Alcohol Use: Yes     socially maybe 1-2 times monthly  . Drug Use: No  . Sexually Active: Not Currently   Other Topics Concern  . Not on file   Social History Narrative   Moved to [place] with daughter, temporarily (trying it out)--moved back to [place]    Family History  Problem Relation Age of Onset  . Hypertension Mother   . Kidney disease Mother     renal failure, dialysis  . Hypertension Father   . Stroke Father   . Arthritis Maternal Aunt   . Heart disease Maternal Aunt   . Diabetes Maternal Grandmother   . Cancer Neg Hx    Current Outpatient Prescriptions on File Prior to Visit  Medication Sig Dispense Refill  . amLODipine (NORVASC) 10 MG tablet Take 1 tablet (10 mg total) by mouth daily.  30 tablet  5  . aspirin 81 MG tablet Take 81 mg by mouth daily.        Marland Kitchen ezetimibe-simvastatin (VYTORIN) 10-40 MG per tablet Take 1 tablet by mouth at bedtime.  56 tablet  0  . metFORMIN (GLUCOPHAGE) 1000 MG tablet Take 1 tablet (1,000 mg total) by mouth 2 (two) times daily with a meal.  60 tablet  2  . Multiple Vitamins-Minerals (CENTRUM SILVER PO)  Take 1 tablet by mouth daily.        Marland Kitchen olmesartan-hydrochlorothiazide (BENICAR HCT) 40-25 MG per tablet Take 1 tablet by mouth daily.  30 tablet  5  . omeprazole (PRILOSEC) 20 MG capsule Take 1 capsule (20 mg total) by mouth daily.  30 capsule  3  . polyethylene glycol (MIRALAX / GLYCOLAX) packet Take 17 g by mouth as needed.        Marland Kitchen DISCONTD: levothyroxine (SYNTHROID, LEVOTHROID) 137 MCG tablet Take 1 tablet (137 mcg total) by mouth daily.  28 tablet  0  . saxagliptin HCl (ONGLYZA) 5 MG TABS tablet Take 1 tablet (5 mg total) by mouth daily.  28 tablet  0   No Known Allergies  ROS:  Denies headaches, fevers, URI symptoms, chest pain, dizziness, shortness of breath, skin lesions, edema or other concerns.  +mild depression.  +weakness (f/b neuro)  PHYSICAL EXAM: BP 144/86   Pulse 72  Ht 5\' 6"  (1.676 m)  Wt 194 lb (87.998 kg)  BMI 31.31 kg/m2 134/74 repeat by MD Well developed, pleasant female in no distress Neck: no lymphadenopathy or thyromegaly, no carotid bruit Heart: regular rate and rhythm Lungs: clear bilaterally Abdomen: soft, nontender, no mass Extremities: no edema, 2+ pulse Skin: no lesions Psych: full range of affect, normal hygiene, grooming  Lab Results  Component Value Date   TSH 0.013* 03/15/2011   Lab Results  Component Value Date   CHOL 162 03/15/2011   HDL 44 03/15/2011   LDLCALC 99 03/15/2011   TRIG 96 03/15/2011   CHOLHDL 3.7 03/15/2011     Chemistry      Component Value Date/Time   NA 140 03/15/2011 1024   K 3.9 03/15/2011 1024   CL 103 03/15/2011 1024   CO2 28 03/15/2011 1024   BUN 14 03/15/2011 1024   CREATININE 0.81 03/15/2011 1024      Component Value Date/Time   CALCIUM 9.6 03/15/2011 1024   ALKPHOS 60 03/15/2011 1024   AST 11 03/15/2011 1024   ALT <8 03/15/2011 1024   BILITOT 0.3 03/15/2011 1024     Vitamin D-OH 23  Lab Results  Component Value Date   HGBA1C 9.5 02/20/2011   Lab Results  Component Value Date   WBC 6.9 03/15/2011   HGB 13.4 03/15/2011   HCT 42.1 03/15/2011   MCV 89.6 03/15/2011   PLT 218 03/15/2011   ASSESSMENT/PLAN: 1. Type II or unspecified type diabetes mellitus without mention of complication, uncontrolled  saxagliptin HCl (ONGLYZA) 5 MG TABS tablet  2. Unspecified vitamin D deficiency  Vitamin D, Ergocalciferol, (DRISDOL) 50000 UNITS CAPS  3. Unspecified hypothyroidism  levothyroxine (SYNTHROID, LEVOTHROID) 125 MCG tablet, TSH  4. Essential hypertension, benign    5. Pure hypercholesterolemia      Vitamin D deficiency--Rx 50,000 IU wekly x 8 weeks, followed by 1000 IU of over-the-counter Vitamin D3 every day.  Hypothyroidism--over-replaced on current dose.  Decrease dose to 125 mcg daily and re-check in 6-8 weeks.  Hyperlipidemia--improved and at goal, although may be affected by thyroid results.  Continue current  meds and plan to recheck in September  Diabetes--still suboptimally controlled per home numbers.  Didn't want to go back on Actos.  Also previously took Januvia, but thinks it caused palpitations, didn't like the way she felt.  Try Onglyza instead--samples given and Rx savings card.   September--recheck vitamin D and cholesterol (and chem panel)  6-8 weeks TSH  3 months--f/u DM and HTN (A1c at the visit)--June or July

## 2011-04-24 NOTE — Patient Instructions (Signed)
Add onglyza to the metformin to try and keep blood sugars <120 in the mornings.  We are lowering your thyroid dose to 125 mcg and will need to recheck TSH in 6-8 weeks.  Take the weekly prescription vitamin D for 8 weeks.  Once you finish the prescription, make sure to get a separate Vitamin D3 over-the-counter and take it every day.  Cut back on the salt in your diet, and that might improve your blood pressure without having to increase your medications.  2 Gram Low Sodium Diet A 2 gram sodium diet restricts the amount of sodium in the diet to no more than 2 g or 2000 mg daily. Limiting the amount of sodium is often used to help lower blood pressure. It is important if you have heart, liver, or kidney problems. Many foods contain sodium for flavor and sometimes as a preservative. When the amount of sodium in a diet needs to be low, it is important to know what to look for when choosing foods and drinks. The following includes some information and guidelines to help make it easier for you to adapt to a low sodium diet. QUICK TIPS  Do not add salt to food.   Avoid convenience items and fast food.   Choose unsalted snack foods.   Buy lower sodium products, often labeled as "lower sodium" or "no salt added."   Check food labels to learn how much sodium is in 1 serving.   When eating at a restaurant, ask that your food be prepared with less salt or none, if possible.  READING FOOD LABELS FOR SODIUM INFORMATION The nutrition facts label is a good place to find how much sodium is in foods. Look for products with no more than 500 to 600 mg of sodium per meal and no more than 150 mg per serving. Remember that 2 g = 2000 mg. The food label may also list foods as:  Sodium-free: Less than 5 mg in a serving.   Very low sodium: 35 mg or less in a serving.   Low-sodium: 140 mg or less in a serving.   Light in sodium: 50% less sodium in a serving. For example, if a food that usually has 300 mg of  sodium is changed to become light in sodium, it will have 150 mg of sodium.   Reduced sodium: 25% less sodium in a serving. For example, if a food that usually has 400 mg of sodium is changed to reduced sodium, it will have 300 mg of sodium.  CHOOSING FOODS Grains  Avoid: Salted crackers and snack items. Some cereals, including instant hot cereals. Bread stuffing and biscuit mixes. Seasoned rice or pasta mixes.   Choose: Unsalted snack items. Low-sodium cereals, oats, puffed wheat and rice, shredded wheat. English muffins and bread. Pasta.  Meats  Avoid: Salted, canned, smoked, spiced, pickled meats, including fish and poultry. Bacon, ham, sausage, cold cuts, hot dogs, anchovies.   Choose: Low-sodium canned tuna and salmon. Fresh or frozen meat, poultry, and fish.  Dairy  Avoid: Processed cheese and spreads. Cottage cheese. Buttermilk and condensed milk. Regular cheese.   Choose: Milk. Low-sodium cottage cheese. Yogurt. Sour cream. Low-sodium cheese.  Fruits and Vegetables  Avoid: Regular canned vegetables. Regular canned tomato sauce and paste. Frozen vegetables in sauces. Olives. Rosita Fire. Relishes. Sauerkraut.   Choose: Low-sodium canned vegetables. Low-sodium tomato sauce and paste. Frozen or fresh vegetables. Fresh and frozen fruit.  Condiments  Avoid: Canned and packaged gravies. Worcestershire sauce. Tartar sauce. Barbecue  sauce. Soy sauce. Steak sauce. Ketchup. Onion, garlic, and table salt. Meat flavorings and tenderizers.   Choose: Fresh and dried herbs and spices. Low-sodium varieties of mustard and ketchup. Lemon juice. Tabasco sauce. Horseradish.  SAMPLE 2 GRAM SODIUM MEAL PLAN Breakfast / Sodium (mg)  1 cup low-fat milk / 143 mg   2 slices whole-wheat toast / 270 mg   1 tbs heart-healthy margarine / 153 mg   1 hard-boiled egg / 139 mg   1 small orange / 0 mg  Lunch / Sodium (mg)  1 cup raw carrots / 76 mg    cup hummus / 298 mg   1 cup low-fat milk / 143  mg    cup red grapes / 2 mg   1 whole-wheat pita bread / 356 mg  Dinner / Sodium (mg)  1 cup whole-wheat pasta / 2 mg   1 cup low-sodium tomato sauce / 73 mg   3 oz lean ground beef / 57 mg   1 small side salad (1 cup raw spinach leaves,  cup cucumber,  cup yellow bell pepper) with 1 tsp olive oil and 1 tsp red wine vinegar / 25 mg  Snack / Sodium (mg)  1 container low-fat vanilla yogurt / 107 mg   3 graham cracker squares / 127 mg  Nutrient Analysis  Calories: 2033   Protein: 77 g   Carbohydrate: 282 g   Fat: 72 g   Sodium: 1971 mg  Document Released: 12/24/2004 Document Revised: 12/13/2010 Document Reviewed: 03/27/2009 [facility] Patient Information 2012 [place], [place].

## 2011-05-20 ENCOUNTER — Telehealth: Payer: Self-pay | Admitting: Internal Medicine

## 2011-05-20 DIAGNOSIS — E782 Mixed hyperlipidemia: Secondary | ICD-10-CM

## 2011-05-20 DIAGNOSIS — I10 Essential (primary) hypertension: Secondary | ICD-10-CM

## 2011-05-20 DIAGNOSIS — E039 Hypothyroidism, unspecified: Secondary | ICD-10-CM

## 2011-05-20 MED ORDER — OLMESARTAN MEDOXOMIL-HCTZ 40-25 MG PO TABS: 1.0000 | ORAL_TABLET | Freq: Every day | ORAL | Status: DC

## 2011-05-20 MED ORDER — EZETIMIBE-SIMVASTATIN 10-40 MG PO TABS: 1.0000 | ORAL_TABLET | Freq: Every day | ORAL | Status: DC

## 2011-05-20 MED ORDER — LEVOTHYROXINE SODIUM 125 MCG PO TABS: 125.0000 ug | ORAL_TABLET | Freq: Every day | ORAL | Status: DC

## 2011-05-20 NOTE — Telephone Encounter (Signed)
Done. Samples up front.

## 2011-05-29 ENCOUNTER — Telehealth: Payer: Self-pay | Admitting: Family Medicine

## 2011-05-29 MED ORDER — SAXAGLIPTIN HCL 5 MG PO TABS: 5.0000 mg | ORAL_TABLET | Freq: Every day | ORAL | Status: DC

## 2011-05-29 NOTE — Telephone Encounter (Signed)
Called in onglyza 5mg  # 30 no refills

## 2011-06-05 ENCOUNTER — Other Ambulatory Visit: Payer: Federal, State, Local not specified - PPO

## 2011-06-05 DIAGNOSIS — E039 Hypothyroidism, unspecified: Secondary | ICD-10-CM

## 2011-06-06 ENCOUNTER — Other Ambulatory Visit: Payer: Self-pay | Admitting: *Deleted

## 2011-06-06 DIAGNOSIS — E039 Hypothyroidism, unspecified: Secondary | ICD-10-CM

## 2011-06-06 MED ORDER — LEVOTHYROXINE SODIUM 100 MCG PO TABS: 100.0000 ug | ORAL_TABLET | Freq: Every day | ORAL | Status: DC

## 2011-06-20 ENCOUNTER — Telehealth: Payer: Self-pay | Admitting: Family Medicine

## 2011-06-20 NOTE — Telephone Encounter (Signed)
LM

## 2011-06-25 ENCOUNTER — Telehealth: Payer: Self-pay | Admitting: Family Medicine

## 2011-06-26 ENCOUNTER — Other Ambulatory Visit: Payer: Self-pay | Admitting: *Deleted

## 2011-06-26 DIAGNOSIS — I10 Essential (primary) hypertension: Secondary | ICD-10-CM

## 2011-06-26 DIAGNOSIS — E782 Mixed hyperlipidemia: Secondary | ICD-10-CM

## 2011-06-26 MED ORDER — EZETIMIBE-SIMVASTATIN 10-40 MG PO TABS: 1.0000 | ORAL_TABLET | Freq: Every day | ORAL | Status: DC

## 2011-06-26 MED ORDER — OLMESARTAN MEDOXOMIL-HCTZ 40-25 MG PO TABS: 1.0000 | ORAL_TABLET | Freq: Every day | ORAL | Status: DC

## 2011-06-27 ENCOUNTER — Other Ambulatory Visit: Payer: Self-pay | Admitting: Family Medicine

## 2011-06-27 NOTE — Telephone Encounter (Signed)
lm

## 2011-07-01 DIAGNOSIS — Z0271 Encounter for disability determination: Secondary | ICD-10-CM

## 2011-07-09 ENCOUNTER — Other Ambulatory Visit: Payer: Self-pay | Admitting: Family Medicine

## 2011-07-10 ENCOUNTER — Encounter: Payer: Self-pay | Admitting: Family Medicine

## 2011-07-10 ENCOUNTER — Ambulatory Visit (INDEPENDENT_AMBULATORY_CARE_PROVIDER_SITE_OTHER): Payer: Federal, State, Local not specified - PPO | Admitting: Family Medicine

## 2011-07-10 VITALS — BP 110/68 | HR 72 | Temp 98.4°F | Ht 66.0 in | Wt 186.0 lb

## 2011-07-10 DIAGNOSIS — J069 Acute upper respiratory infection, unspecified: Secondary | ICD-10-CM

## 2011-07-10 MED ORDER — AMOXICILLIN 875 MG PO TABS: 875.0000 mg | ORAL_TABLET | Freq: Two times a day (BID) | ORAL | Status: AC

## 2011-07-10 NOTE — Progress Notes (Signed)
Chief Complaint  Patient presents with  . Nasal Congestion    nasal/head congestion started last Tuesday. Cannot smell or taste anything. Nose is constantly running. Now congestion has spread to her chest. Slight cough. Also some dizziness the last 2-3 days.   HPI: Symptoms began 8 days ago with runny nose and head felt like it was in a tunnel, stuffy nose.  Over the last week, it has moved into her chest.  Recently started coughing, and phlegm is clear.  Denies any shortness of breath.  Cough isn't constant, not keeping her awake at night. Mucus from her nose is clear.  Had some lowgrade fevers over the last week, denies chills or high fevers.  Denies sinus pain or headaches.  Has been using OTC Alka Selzer day and night, and chest congestion pills, and has been using OTC decongestant nasal spray.  Denies any sick contacts, but thinks it may have been related to being at friends without AC, and having fan blowing directly on her.  Symptoms started the following day. Decreased sense of smell and taste since illness began (but not completely lost)  Past Medical History  Diagnosis Date  . Hypertension   . Diabetes mellitus   . Hyperlipidemia   . GERD (gastroesophageal reflux disease)   . Premature menopause age 62  . Constipation   . Vitamin d deficiency 2009  . Thyroid disease 200    hypothyroidism   Past Surgical History  Procedure Date  . Esophagogastroduodenoscopy [id number]  . Colonoscopy [id number]  . Cesarean section     x 2  . Tubal ligation    History   Social History  . Marital Status: Widowed    Spouse Name: N/A    Number of Children: N/A  . Years of Education: N/A   Occupational History  . Retired from [employer]    Social History Main Topics  . Smoking status: Never Smoker   . Smokeless tobacco: Never Used  . Alcohol Use: Yes     socially maybe 1-2 times monthly  . Drug Use: No  . Sexually Active: Not Currently   Other Topics Concern  . Not on file   Social History  Narrative   Moved to [place] with daughter, temporarily (trying it out)--moved back to [facility]   Current Outpatient Prescriptions on File Prior to Visit  Medication Sig Dispense Refill  . amLODipine (NORVASC) 10 MG tablet Take 1 tablet (10 mg total) by mouth daily.  30 tablet  5  . aspirin 81 MG tablet Take 81 mg by mouth daily.        Marland Kitchen ezetimibe-simvastatin (VYTORIN) 10-40 MG per tablet Take 1 tablet by mouth at bedtime.  28 tablet  0  . levothyroxine (SYNTHROID, LEVOTHROID) 100 MCG tablet Take 1 tablet (100 mcg total) by mouth daily.  30 tablet  1  . metFORMIN (GLUCOPHAGE) 1000 MG tablet take 1 tablet by mouth twice a day with food  60 tablet  2  . Multiple Vitamins-Minerals (CENTRUM SILVER PO) Take 1 tablet by mouth daily.        Marland Kitchen olmesartan-hydrochlorothiazide (BENICAR HCT) 40-25 MG per tablet Take 1 tablet by mouth daily.  28 tablet  0  . ONGLYZA 5 MG TABS tablet take 1 tablet by mouth once daily  30 tablet  0  . polyethylene glycol (MIRALAX / GLYCOLAX) packet Take 17 g by mouth as needed.        Marland Kitchen omeprazole (PRILOSEC) 20 MG capsule Take 1 capsule (20 mg total)  by mouth daily.  30 capsule  3  . DISCONTD: levothyroxine (SYNTHROID, LEVOTHROID) 125 MCG tablet Take 1 tablet (125 mcg total) by mouth daily.  28 tablet  0   No Known Allergies  ROS:  Denies fevers, headaches, shortness of breath, chest pain.  Some nausea. No vomiting or diarrhea, no skin rash  PHYSICAL EXAM: BP 110/68  Pulse 72  Temp 98.4 F (36.9 C) (Oral)  Ht 5\' 6"  (1.676 m)  Wt 186 lb (84.369 kg)  BMI 30.02 kg/m2 Well appearing female, in no distress with frequent throat clearing, rare cough and sniffles. HEENT:  PERRL, conjuntiva clear.  TM's and EAC's normal.  OP clear without erythema.  Nasal mucosa moderately edematous, no erythema.  Some thin yellow mucus in L near, clear elsewhere. Sinuses nontender Neck: no lymphadenopathy or mass Heart: regular rate and rhythm without murmur Lungs: clear bilaterally Skin: no  rash Psych: normal mood, affect  ASSESSMENT/PLAN: 1. URI (upper respiratory infection)  amoxicillin (AMOXIL) 875 MG tablet   Stop nasal decongestants (risks reviewed). Use Mucinex tablets twice daily (guaifenesin, expectorant--take as directed on package) Use claritin or zyrtec to help dry up the runny nose (once daily, not the D version, unless monitoring BP) Sinus rinses are recommended 1-2x daily (sinus rinse kit or Neti-pot). If symptoms aren't improving, especially if getting any discolored mucus or any fevers, then need to take a full course of antibiotics.

## 2011-07-10 NOTE — Patient Instructions (Addendum)
Stop nasal decongestants  Use Mucinex tablets twice daily (guaifenesin, expectorant--take as directed on package) Use claritin or zyrtec to help dry up the runny nose (once daily, not the D version, unless monitoring BP) Sinus rinses are recommended 1-2x daily (sinus rinse kit or Neti-pot). If symptoms aren't improving, especially if getting any discolored mucus or any fevers, then need to take a full course of antibiotics.  If you start the amoxicillin, you must complete the entire course (stop only if having a reaction to medication)

## 2011-07-14 ENCOUNTER — Other Ambulatory Visit: Payer: Self-pay | Admitting: Family Medicine

## 2011-07-17 ENCOUNTER — Other Ambulatory Visit: Payer: Federal, State, Local not specified - PPO

## 2011-07-24 ENCOUNTER — Other Ambulatory Visit: Payer: Federal, State, Local not specified - PPO

## 2011-08-01 ENCOUNTER — Telehealth: Payer: Self-pay | Admitting: Family Medicine

## 2011-08-01 DIAGNOSIS — E782 Mixed hyperlipidemia: Secondary | ICD-10-CM

## 2011-08-01 DIAGNOSIS — I10 Essential (primary) hypertension: Secondary | ICD-10-CM

## 2011-08-01 MED ORDER — OLMESARTAN MEDOXOMIL-HCTZ 40-25 MG PO TABS: 1.0000 | ORAL_TABLET | Freq: Every day | ORAL | Status: DC

## 2011-08-01 MED ORDER — EZETIMIBE-SIMVASTATIN 10-40 MG PO TABS: 1.0000 | ORAL_TABLET | Freq: Every day | ORAL | Status: DC

## 2011-08-01 NOTE — Telephone Encounter (Signed)
Done

## 2011-08-08 ENCOUNTER — Other Ambulatory Visit: Payer: Federal, State, Local not specified - PPO

## 2011-08-08 DIAGNOSIS — E039 Hypothyroidism, unspecified: Secondary | ICD-10-CM

## 2011-08-12 ENCOUNTER — Other Ambulatory Visit: Payer: Self-pay | Admitting: *Deleted

## 2011-08-12 DIAGNOSIS — E039 Hypothyroidism, unspecified: Secondary | ICD-10-CM

## 2011-08-20 ENCOUNTER — Other Ambulatory Visit: Payer: Self-pay | Admitting: Family Medicine

## 2011-08-20 ENCOUNTER — Telehealth: Payer: Self-pay | Admitting: Family Medicine

## 2011-08-20 DIAGNOSIS — I10 Essential (primary) hypertension: Secondary | ICD-10-CM

## 2011-08-20 MED ORDER — AMLODIPINE BESYLATE 10 MG PO TABS: 10.0000 mg | ORAL_TABLET | Freq: Every day | ORAL | Status: DC

## 2011-08-20 NOTE — Telephone Encounter (Signed)
Patient needs refill on Norvasc [address] [place]

## 2011-08-20 NOTE — Telephone Encounter (Signed)
Norvasc continued

## 2011-09-04 ENCOUNTER — Telehealth: Payer: Self-pay | Admitting: Family Medicine

## 2011-09-04 DIAGNOSIS — I10 Essential (primary) hypertension: Secondary | ICD-10-CM

## 2011-09-04 DIAGNOSIS — E782 Mixed hyperlipidemia: Secondary | ICD-10-CM

## 2011-09-04 MED ORDER — EZETIMIBE-SIMVASTATIN 10-40 MG PO TABS: 1.0000 | ORAL_TABLET | Freq: Every day | ORAL | Status: DC

## 2011-09-04 MED ORDER — OLMESARTAN MEDOXOMIL-HCTZ 40-25 MG PO TABS: 1.0000 | ORAL_TABLET | Freq: Every day | ORAL | Status: DC

## 2011-09-04 NOTE — Telephone Encounter (Signed)
Pt given samples and left her a message informing her of this.

## 2011-09-24 ENCOUNTER — Other Ambulatory Visit: Payer: Federal, State, Local not specified - PPO

## 2011-09-24 DIAGNOSIS — E039 Hypothyroidism, unspecified: Secondary | ICD-10-CM

## 2011-09-25 ENCOUNTER — Other Ambulatory Visit: Payer: Federal, State, Local not specified - PPO

## 2011-09-25 ENCOUNTER — Other Ambulatory Visit: Payer: Self-pay | Admitting: *Deleted

## 2011-09-25 DIAGNOSIS — E039 Hypothyroidism, unspecified: Secondary | ICD-10-CM

## 2011-09-25 LAB — TSH: TSH: 9.162 u[IU]/mL — ABNORMAL HIGH (ref 0.350–4.500)

## 2011-09-25 MED ORDER — LEVOTHYROXINE SODIUM 50 MCG PO TABS: 50.0000 ug | ORAL_TABLET | Freq: Every day | ORAL | Status: DC

## 2011-10-01 ENCOUNTER — Telehealth: Payer: Self-pay | Admitting: Family Medicine

## 2011-10-01 NOTE — Telephone Encounter (Signed)
I suspect that patient is requesting lactulose due to worsening constipation (as her thyroid is underactive currently, and she has had issues with constipation).  If she hasn't been using Miralax daily, then restart daily miralax (and then decrease frequency once she gets results).  If she has already been using lactulose, then try a faster-acting laxative such as dulcolax or correctol, or a fleet's enema (all are OTC).  Rx's cannot be sent for new meds without evaluation. I suspect her constipation is worse due to her being hypothyroid, and should improve soon with recent dose change.

## 2011-10-01 NOTE — Telephone Encounter (Signed)
PT WAS CALLED AND INFORMED OF DR. KNAPP'S NOTE. PT VERBALIZED UNDERSTANDING AND STATED THAT SHE WOULD TRY THE MIRALAX.

## 2011-10-01 NOTE — Telephone Encounter (Signed)
Pt called to schedule a lab appointment to have labs drawn. That was handled but then she asked if I could send a message to you. She states that she would like a rx sent in for Lactulose. I ask if this was a medication you had given her before and she stated no. I informed her that she would need an appointment and she stated that she wanted me to put a note back requesting med and declined appt with you tomorrow. Pt uses [address]

## 2011-10-15 ENCOUNTER — Telehealth: Payer: Self-pay | Admitting: Family Medicine

## 2011-10-15 DIAGNOSIS — E782 Mixed hyperlipidemia: Secondary | ICD-10-CM

## 2011-10-15 DIAGNOSIS — I10 Essential (primary) hypertension: Secondary | ICD-10-CM

## 2011-10-15 DIAGNOSIS — E119 Type 2 diabetes mellitus without complications: Secondary | ICD-10-CM

## 2011-10-15 DIAGNOSIS — E559 Vitamin D deficiency, unspecified: Secondary | ICD-10-CM

## 2011-10-15 DIAGNOSIS — E7800 Pure hypercholesterolemia, unspecified: Secondary | ICD-10-CM

## 2011-10-15 MED ORDER — OLMESARTAN MEDOXOMIL-HCTZ 40-25 MG PO TABS: 1.0000 | ORAL_TABLET | Freq: Every day | ORAL | Status: DC

## 2011-10-15 MED ORDER — EZETIMIBE-SIMVASTATIN 10-40 MG PO TABS: 1.0000 | ORAL_TABLET | Freq: Every day | ORAL | Status: DC

## 2011-10-15 NOTE — Telephone Encounter (Signed)
Put in benicar hct 40/25mg , vytorin 10/40mg 

## 2011-10-15 NOTE — Telephone Encounter (Signed)
PT MADE MED [facility] APPOINTMENT IN AFTERNOON ON 11/13 AND WANTS TO GET ALL BLOOD WORK DONE ON 11/7 SINCE SHE HAS THE LAB APPOINTMENT SCHEDULED THAT DAY ALREADY. IS THE FASTING BLOOD WORK OK FOR MED CHECK ON 11/7?

## 2011-10-15 NOTE — Telephone Encounter (Signed)
She is past due for fasting med check (I think she has a TSH scheduled, lab only for November, but is past due for f/u DM, HTN, lipids, etc.).  Please schedule fasting med check, and give samples to last until visit

## 2011-10-15 NOTE — Telephone Encounter (Signed)
Yes--okay to do all labs on 11/7 for 11/13 visit.  Labs should be fasting.  Orders were entered.

## 2011-10-30 ENCOUNTER — Encounter: Payer: Self-pay | Admitting: Family Medicine

## 2011-10-30 ENCOUNTER — Ambulatory Visit (INDEPENDENT_AMBULATORY_CARE_PROVIDER_SITE_OTHER): Payer: Federal, State, Local not specified - PPO | Admitting: Family Medicine

## 2011-10-30 VITALS — BP 140/78 | HR 72 | Ht 66.0 in | Wt 186.0 lb

## 2011-10-30 DIAGNOSIS — R21 Rash and other nonspecific skin eruption: Secondary | ICD-10-CM

## 2011-10-30 DIAGNOSIS — R202 Paresthesia of skin: Secondary | ICD-10-CM | POA: Insufficient documentation

## 2011-10-30 DIAGNOSIS — R209 Unspecified disturbances of skin sensation: Secondary | ICD-10-CM

## 2011-10-30 NOTE — Progress Notes (Signed)
Chief Complaint  Patient presents with  . Arm Pain    having some left arm discomfort and tingling that run up and down her arm. Also has an itch around her c-section scar. Pt declines flu vaccine today, states she will get at her visit in Nov.   HPI: Started having tingling in her left hand, that runs up her L arm, for about 3-4 months.  Frequency has increased more recently, getting symptoms every other day.  Usually notices when she is sitting.  Denies any change in activity.  Resolves spontaneously.  Denies any weakness (has chronic weakness on right side of her body/UE)--not dropping things, but perhaps might be a little weaker than normal for her.  Denies any neck pain.  Tingling is located on dorsum of hand, at ulnar aspect, and travels up the arm.  Also having itching around her c-section scar x 6 months.  Using OTC cortisone cream that temporarily helps.  Past Medical History  Diagnosis Date  . Hypertension   . Diabetes mellitus   . Hyperlipidemia   . GERD (gastroesophageal reflux disease)   . Premature menopause age 34  . Constipation   . Vitamin D deficiency 2009  . Thyroid disease 200    hypothyroidism   Past Surgical History  Procedure Date  . Esophagogastroduodenoscopy [id number]  . Colonoscopy [id number]  . Cesarean section     x 2  . Tubal ligation    History   Social History  . Marital Status: Widowed    Spouse Name: N/A    Number of Children: N/A  . Years of Education: N/A   Occupational History  . Retired from [employer]    Social History Main Topics  . Smoking status: Never Smoker   . Smokeless tobacco: Never Used  . Alcohol Use: Yes     socially maybe 1-2 times monthly  . Drug Use: No  . Sexually Active: Not Currently   Other Topics Concern  . Not on file   Social History Narrative   Moved to [place] with daughter, temporarily (trying it out)--moved back to [facility]   Current Outpatient Prescriptions on File Prior to Visit  Medication Sig Dispense Refill   . amLODipine (NORVASC) 10 MG tablet Take 1 tablet (10 mg total) by mouth daily.  30 tablet  11  . aspirin 81 MG tablet Take 81 mg by mouth daily.        Marland Kitchen ezetimibe-simvastatin (VYTORIN) 10-40 MG per tablet Take 1 tablet by mouth at bedtime.  56 tablet  0  . levothyroxine (SYNTHROID, LEVOTHROID) 50 MCG tablet Take 1 tablet (50 mcg total) by mouth daily.  30 tablet  1  . metFORMIN (GLUCOPHAGE) 1000 MG tablet take 1 tablet by mouth twice a day with food  60 tablet  2  . Multiple Vitamins-Minerals (CENTRUM SILVER PO) Take 1 tablet by mouth daily.        Marland Kitchen olmesartan-hydrochlorothiazide (BENICAR HCT) 40-25 MG per tablet Take 1 tablet by mouth daily.  35 tablet  0  . omeprazole (PRILOSEC) 20 MG capsule take 1 capsule by mouth once daily  30 capsule  3  . ONGLYZA 5 MG TABS tablet take 1 tablet by mouth once daily  30 tablet  0   No Known Allergies  ROS:  Denies fevers, URI symptoms, cough, shortness of breath, GI complaints.  See HPI  PHYSICAL EXAM: BP 140/78  Pulse 72  Ht 5\' 6"  (1.676 m)  Wt 186 lb (84.369 kg)  BMI 30.02 kg/m2  Well developed female in no distress  Negative Phalen and tinel.  Neck: no c-spine tenderness or muscle spasm.  No lymphadenopathy or thyromegaly Strength--normal in LUE, weakened in right.  DTR's 2+ in LUE.  Normal sensation Heart: regular rate and rhythm Lungs: clear bilaterally Abdomen: along C-section scar, more on left side than right, there is hyperpigementation.  No maceration or raised area, some thickening of skin  ASSESSMENT/PLAN:  1. Paresthesia of arm    and hand; LEFT  2. Rash     Paresthesias L arm most consistent with ulnar neuropathy. Pay attention to when tingling occurs--what activity, what arm position, and which fingers/part of arm.  Try to avoid resting elbow on arm rests.  Consider taking Aleve or ibuprofen for a week to help with decreasing inflammation in the nerve if not improving with other measures.  If ongoing symptoms, may need  EMG/NCV to further evaluate etiology.  Rash at C-section scar.  Given that it is in a skinfold, moisture may contribute and being a diabetic increases the risk of a fungal component.  Try and keep the area dry (use powder if necessary).  I would try topical antifungal such as clotrimazole (lotrimin) cream twice daily for 1-2 weeks, and you may continue with hydrocortisone twice daily as needed for itching.  Consider taking benadryl at bedtime to avoid scratching during your sleep.   Follow-up as scheduled next month for labs and med check

## 2011-10-30 NOTE — Patient Instructions (Signed)
Paresthesias Left arm and hand-- most consistent with ulnar neuropathy. Pay attention to when tingling occurs--what activity, what arm position.  Try to avoid resting elbow on arm rests.  Consider taking Aleve or ibuprofen for a week to help with decreasing inflammation in the nerve if not improving with other measures.  If ongoing symptoms, may need EMG/NCV to further evaluate etiology.  Rash at C-section scar.  Given that it is in a skinfold, moisture may contribute and being a diabetic increases the risk of a fungal component.  Try and keep the area dry (use powder if necessary).  I would try topical antifungal such as clotrimazole (lotrimin) cream twice daily for 1-2 weeks, and you may continue with hydrocortisone twice daily as needed for itching.  Consider taking benadryl at bedtime to avoid scratching during your sleep.   Follow-up as scheduled next month.

## 2011-11-14 ENCOUNTER — Other Ambulatory Visit: Payer: Federal, State, Local not specified - PPO

## 2011-11-14 DIAGNOSIS — E559 Vitamin D deficiency, unspecified: Secondary | ICD-10-CM

## 2011-11-14 DIAGNOSIS — E039 Hypothyroidism, unspecified: Secondary | ICD-10-CM

## 2011-11-14 DIAGNOSIS — I10 Essential (primary) hypertension: Secondary | ICD-10-CM

## 2011-11-14 DIAGNOSIS — E7800 Pure hypercholesterolemia, unspecified: Secondary | ICD-10-CM

## 2011-11-14 DIAGNOSIS — E782 Mixed hyperlipidemia: Secondary | ICD-10-CM

## 2011-11-14 DIAGNOSIS — E119 Type 2 diabetes mellitus without complications: Secondary | ICD-10-CM

## 2011-11-14 LAB — COMPREHENSIVE METABOLIC PANEL
ALT: <8 U/L (ref 0–35)
AST: 12 U/L (ref 0–37)
BUN: 15 mg/dL (ref 6–23)
Calcium: 9.4 mg/dL (ref 8.4–10.5)
Chloride: 105 mEq/L (ref 96–112)
Creat: 0.92 mg/dL (ref 0.50–1.10)
Total Bilirubin: 0.4 mg/dL (ref 0.3–1.2)

## 2011-11-14 LAB — LIPID PANEL
Cholesterol: 119 mg/dL (ref 0–200)
HDL: 48 mg/dL (ref >39)
Total CHOL/HDL Ratio: 2.5 Ratio
VLDL: 14 mg/dL (ref 0–40)

## 2011-11-14 LAB — TSH: TSH: 2.687 u[IU]/mL (ref 0.350–4.500)

## 2011-11-14 LAB — HEMOGLOBIN A1C: Mean Plasma Glucose: 177 mg/dL — ABNORMAL HIGH (ref <117)

## 2011-11-15 LAB — VITAMIN D 25 HYDROXY (VIT D DEFICIENCY, FRACTURES): Vit D, 25-Hydroxy: 25 ng/mL — ABNORMAL LOW (ref 30–89)

## 2011-11-15 LAB — MICROALBUMIN / CREATININE URINE RATIO: Microalb, Ur: 1.14 mg/dL (ref 0.00–1.89)

## 2011-11-20 ENCOUNTER — Encounter: Payer: Federal, State, Local not specified - PPO | Admitting: Medical

## 2011-11-21 ENCOUNTER — Encounter: Payer: Self-pay | Admitting: Medical

## 2011-11-21 ENCOUNTER — Ambulatory Visit (INDEPENDENT_AMBULATORY_CARE_PROVIDER_SITE_OTHER): Payer: Federal, State, Local not specified - PPO | Admitting: Medical

## 2011-11-21 VITALS — BP 122/80 | HR 60 | Temp 98.3°F | Resp 16 | Wt 188.0 lb

## 2011-11-21 DIAGNOSIS — L538 Other specified erythematous conditions: Secondary | ICD-10-CM

## 2011-11-21 DIAGNOSIS — R439 Unspecified disturbances of smell and taste: Secondary | ICD-10-CM

## 2011-11-21 DIAGNOSIS — E039 Hypothyroidism, unspecified: Secondary | ICD-10-CM

## 2011-11-21 DIAGNOSIS — E559 Vitamin D deficiency, unspecified: Secondary | ICD-10-CM

## 2011-11-21 DIAGNOSIS — I10 Essential (primary) hypertension: Secondary | ICD-10-CM

## 2011-11-21 DIAGNOSIS — L304 Erythema intertrigo: Secondary | ICD-10-CM

## 2011-11-21 DIAGNOSIS — Z23 Encounter for immunization: Secondary | ICD-10-CM

## 2011-11-21 DIAGNOSIS — E119 Type 2 diabetes mellitus without complications: Secondary | ICD-10-CM

## 2011-11-21 DIAGNOSIS — E7800 Pure hypercholesterolemia, unspecified: Secondary | ICD-10-CM

## 2011-11-21 MED ORDER — SAXAGLIPTIN HCL 5 MG PO TABS: 5.0000 mg | ORAL_TABLET | Freq: Every day | ORAL | Status: DC

## 2011-11-21 NOTE — Patient Instructions (Addendum)
Try not to run out of Onglyza.   Continue all of your medications as usual.  exercise regularly as you are doing.  Eat a healthy diabetic diet.    Begin back on Vitamin D 1000 IU daily.    We updated your flu vaccine today.  Taste/smell - try Zyrtec OTC 10mg  daily or Mucinex plain OTC for the next 1-2 weeks.   If this doesn't change your symptoms, let us know.    Try gold bond drying powders at the rash.  If not improving, try some OTC generic antifungal cream such as Lamisil.  Recheck in 4 months.

## 2011-11-22 ENCOUNTER — Encounter: Payer: Self-pay | Admitting: Medical

## 2011-11-22 NOTE — Progress Notes (Signed)
Subjective:   HPI  Ann Weiss is a 61 y.o. female who presents for chronic disease f/u/med check.  She normally sees Dr. Lynelle Doctor here, but apparently there was a scheduling conflict.   She reports the last month with changes in her sense of smell/taste.  denies other new focal neurological changes.  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  Last MRI 2012.  Sees neurology in [facility]   No recent URI symptoms, no sinus pressure, no recent medication changes.  Diabetes type II - checks glucose.  Ranges 130-150s fasting . Doesn't check daily.  She does exercise daily, nonsmoker, eating healthy.  She ran out of onglyza 3 mo ago, but is compliant with her other diabetes medications.  currently not taking Vit D.  Compliant with medications for cholesterol, thyroid and blood pressure.    Still c/o rash of lower abdomen.   Used hydrocortisone cream, but not gold bond powders as suggested by Dr. Lynelle Doctor.    No other c/o.  The following portions of the patient's history were reviewed and updated as appropriate: allergies, current medications, past family history, past medical history, past social history, past surgical history and problem list.  Past Medical History  Diagnosis Date  . Hypertension   . Diabetes mellitus   . Hyperlipidemia   . GERD (gastroesophageal reflux disease)   . Premature menopause age 34  . Constipation   . Vitamin D deficiency 2009  . Thyroid disease 200    hypothyroidism    No Known Allergies   Review of Systems ROS reviewed and was negative other than noted in HPI or above.    Objective:   Physical Exam  General appearance: alert, no distress, WD/WN HEENT: normocephalic, sclerae anicteric, right TM with serous fluid, left TM flat with mild erythema, nares with swollen turbinates, no discharge or erythema, pharynx normal Oral cavity: MMM, no lesions Neck: supple, no lymphadenopathy, no thyromegaly, no  masses, no bruits Heart: RRR, normal S1, S2, no murmurs Lungs: CTA bilaterally, no wheezes, rhonchi, or rales Abdomen: +bs, soft, non tender, non distended, no masses, no hepatomegaly, no splenomegaly Pulses: 2+ symmetric, upper and lower extremities, normal cap refill Skin: intertriginous region below abdominal panus with horizontal patch of rough skin, slightly pinkish, but not beefy red or frank fungal infection   Assessment and Plan :     Encounter Diagnoses  Name Primary?  . Type II or unspecified type diabetes mellitus without mention of complication, not stated as uncontrolled Yes  . Essential hypertension, benign   . Pure hypercholesterolemia   . Hypothyroidism   . Need for prophylactic vaccination and inoculation against influenza   . Decreased taste and smell   . Intertrigo   . Vitamin D deficiency    Reviewed her recent labs.  She had made improvement but ran out of Onglyza, thus HgbA1C crept back up to 7.8%.  Rest of labs look fine.  Patient Instructions  Try not to run out of Onglyza.   Continue all of your medications as usual.  exercise regularly as you are doing.  Eat a healthy diabetic diet.    Begin back on Vitamin D 1000 IU daily.    We updated your flu vaccine today.  Taste/smell - try Zyrtec OTC 10mg  daily or Mucinex plain OTC for the next 1-2 weeks.   If this doesn't change your symptoms, let us know.    Try gold bond drying powders at the  rash.  If not improving, try some OTC generic antifungal cream such as Lamisil.  Recheck in 4 months.

## 2011-11-25 ENCOUNTER — Telehealth: Payer: Self-pay | Admitting: Family Medicine

## 2011-11-25 NOTE — Telephone Encounter (Signed)
She just saw you--did she mention anything about GI issues?  Last visit with me, we only discussed tingling, so I don't recall any GI issues.  If she didn't mention anything to you, might need visit, or at least some kind of history

## 2011-11-26 NOTE — Telephone Encounter (Signed)
Advise pt--needs OV (GI issues haven't been discussed at recent visits)

## 2011-11-26 NOTE — Telephone Encounter (Signed)
We discussed her issues with a change in taste/smell.  i advised zyrtec for a week or 2 since she had some allergy related symptoms.  But no other GI issue discussed.

## 2011-11-26 NOTE — Telephone Encounter (Signed)
Patient was made aware that she needs a office visit. cls

## 2011-12-03 ENCOUNTER — Other Ambulatory Visit: Payer: Self-pay | Admitting: Family Medicine

## 2011-12-18 ENCOUNTER — Telehealth: Payer: Self-pay | Admitting: Family Medicine

## 2011-12-18 DIAGNOSIS — I10 Essential (primary) hypertension: Secondary | ICD-10-CM

## 2011-12-18 DIAGNOSIS — E782 Mixed hyperlipidemia: Secondary | ICD-10-CM

## 2011-12-19 MED ORDER — OLMESARTAN MEDOXOMIL-HCTZ 40-25 MG PO TABS: 1.0000 | ORAL_TABLET | Freq: Every day | ORAL | Status: DC

## 2011-12-19 MED ORDER — EZETIMIBE-SIMVASTATIN 10-40 MG PO TABS: 1.0000 | ORAL_TABLET | Freq: Every day | ORAL | Status: DC

## 2011-12-19 NOTE — Telephone Encounter (Signed)
done

## 2012-01-02 ENCOUNTER — Other Ambulatory Visit: Payer: Self-pay | Admitting: Family Medicine

## 2012-01-22 ENCOUNTER — Telehealth: Payer: Self-pay | Admitting: Internal Medicine

## 2012-01-22 DIAGNOSIS — E119 Type 2 diabetes mellitus without complications: Secondary | ICD-10-CM

## 2012-01-22 DIAGNOSIS — I10 Essential (primary) hypertension: Secondary | ICD-10-CM

## 2012-01-22 DIAGNOSIS — E782 Mixed hyperlipidemia: Secondary | ICD-10-CM

## 2012-01-22 MED ORDER — EZETIMIBE-SIMVASTATIN 10-40 MG PO TABS: 1.0000 | ORAL_TABLET | Freq: Every day | ORAL | Status: DC

## 2012-01-22 MED ORDER — SAXAGLIPTIN HCL 5 MG PO TABS: 5.0000 mg | ORAL_TABLET | Freq: Every day | ORAL | Status: DC

## 2012-01-22 MED ORDER — OLMESARTAN MEDOXOMIL-HCTZ 40-25 MG PO TABS: 1.0000 | ORAL_TABLET | Freq: Every day | ORAL | Status: DC

## 2012-01-22 NOTE — Telephone Encounter (Signed)
Pt would like samples of benicar, vytorin and onglyza. If we do not have samples pt would like them to be sent to [facility] on [address]. Call when ready

## 2012-01-22 NOTE — Telephone Encounter (Signed)
Samples given and patient notified 

## 2012-01-31 ENCOUNTER — Other Ambulatory Visit: Payer: Self-pay | Admitting: Medical

## 2012-02-12 ENCOUNTER — Telehealth: Payer: Self-pay | Admitting: Family Medicine

## 2012-02-12 ENCOUNTER — Other Ambulatory Visit: Payer: Self-pay | Admitting: Medical

## 2012-02-14 NOTE — Telephone Encounter (Signed)
done

## 2012-03-02 ENCOUNTER — Telehealth: Payer: Self-pay | Admitting: Family Medicine

## 2012-03-02 NOTE — Telephone Encounter (Signed)
PT called this morning and wants samples for Vytorin, Benicar and Ongliza.  Pt # [phone]

## 2012-03-24 ENCOUNTER — Other Ambulatory Visit: Payer: Self-pay | Admitting: Family Medicine

## 2012-04-08 ENCOUNTER — Telehealth: Payer: Self-pay | Admitting: Family Medicine

## 2012-04-08 DIAGNOSIS — I10 Essential (primary) hypertension: Secondary | ICD-10-CM

## 2012-04-08 DIAGNOSIS — E782 Mixed hyperlipidemia: Secondary | ICD-10-CM

## 2012-04-08 MED ORDER — OLMESARTAN MEDOXOMIL-HCTZ 40-25 MG PO TABS: 1.0000 | ORAL_TABLET | Freq: Every day | ORAL | Status: DC

## 2012-04-08 MED ORDER — EZETIMIBE-SIMVASTATIN 10-40 MG PO TABS: 1.0000 | ORAL_TABLET | Freq: Every day | ORAL | Status: DC

## 2012-04-08 NOTE — Telephone Encounter (Signed)
Samples given, patient notified.

## 2012-04-20 ENCOUNTER — Ambulatory Visit: Payer: Self-pay | Admitting: Family Medicine

## 2012-04-23 ENCOUNTER — Ambulatory Visit (INDEPENDENT_AMBULATORY_CARE_PROVIDER_SITE_OTHER): Payer: Federal, State, Local not specified - PPO | Admitting: Family Medicine

## 2012-04-23 ENCOUNTER — Encounter: Payer: Self-pay | Admitting: Family Medicine

## 2012-04-23 VITALS — BP 124/82 | HR 72 | Ht 66.0 in | Wt 185.0 lb

## 2012-04-23 DIAGNOSIS — R32 Unspecified urinary incontinence: Secondary | ICD-10-CM

## 2012-04-23 DIAGNOSIS — N3941 Urge incontinence: Secondary | ICD-10-CM

## 2012-04-23 LAB — POCT URINALYSIS DIPSTICK
Ketones, UA: NEGATIVE
Protein, UA: NEGATIVE
Spec Grav, UA: 1.015
pH, UA: 5

## 2012-04-23 NOTE — Patient Instructions (Addendum)
Try and use restroom more frequently so that bladder doesn't get as full (which can trigger spasms and leakage). Try and cut back on caffeine intake, as this may decrease leakage We are sending urine for culture and will contact you if there is an infection.  Urinary Incontinence Your doctor wants you to have this information about urinary incontinence. This is the inability to keep urine in your body until you decide to release it. CAUSES  Prostate gland enlargement is a common cause of urinary incontinence. But there are many different causes for losing urinary control. They include:  Medicines.  Infections.  Prostate problems.  Surgery.  Neurological diseases.  Emotional factors. DIAGNOSIS  Evaluating the cause of incontinence is important in choosing the best treatment. This may require:  An ultrasound exam.  Kidney and bladder X-rays.  Cystoscopy. This is an exam of the bladder using a narrow scope. TREATMENT  For incontinent patients, normal daily hygiene and using changing pads or adult diapers regularly will prevent offensive odors and skin damage from the moisture. Changing your medicines may help control incontinence. Your caregiver may prescribe some medicines to help you regain control. Avoid caffeine. It can over-stimulate the bladder. Use the bathroom regularly. Try about every 2 to 3 hours even if you do not feel the need. Take time to empty your bladder completely. After urinating, wait a minute. Then try to urinate again. External devices used to catch urine or an indwelling urine catheter (Foley catheter) may be needed as well. Some prostate gland problems require surgery to correct. Call your caregiver for more information. Document Released: 02/01/2004 Document Revised: 03/18/2011 Document Reviewed: 01/27/2008 [facility] Patient Information 2013 [place], [place].

## 2012-04-23 NOTE — Progress Notes (Signed)
Chief Complaint  Patient presents with  . Advice Only    thinks that her bladder is leaking, no odor to her leakage/urine. Also has a "feeling" near her backbone.    She started with leakage of urine within the last couple of months.  Leakage occurs when she has to go to the bathroom, when bladder is full.  She wakes up feeling wet in the morning, so has had some leakage during the night.  Denies dysuria, urgency or frequency.  Onset of incontinence was gradual.  Denies any change in fluid intake.  She denies any abdominal pain, but reports having a lot of itching over her lower abdominal area.  Reports having an "orgasmic feeling" in her sacral area, which comes and goes.  This is more noticeable when her bladder is full, and if she pushes on that area it makes her have to void more.  "feels weird"  Past Medical History  Diagnosis Date  . Hypertension   . Diabetes mellitus   . Hyperlipidemia   . GERD (gastroesophageal reflux disease)   . Premature menopause age 48  . Constipation   . Vitamin D deficiency 2009  . Thyroid disease 200    hypothyroidism   Past Surgical History  Procedure Laterality Date  . Esophagogastroduodenoscopy  [id number]  . Colonoscopy  [id number]  . Cesarean section      x 2  . Tubal ligation     History   Social History  . Marital Status: Widowed    Spouse Name: N/A    Number of Children: N/A  . Years of Education: N/A   Occupational History  . Retired from [employer]    Social History Main Topics  . Smoking status: Never Smoker   . Smokeless tobacco: Never Used  . Alcohol Use: Yes     Comment: socially maybe 1-2 times monthly  . Drug Use: No  . Sexually Active: Not Currently   Other Topics Concern  . Not on file   Social History Narrative   Moved to [place] with daughter, temporarily (trying it out)--moved back to [facility]   Current Outpatient Prescriptions on File Prior to Visit  Medication Sig Dispense Refill  . amLODipine (NORVASC) 10 MG tablet Take  1 tablet (10 mg total) by mouth daily.  30 tablet  11  . aspirin 81 MG tablet Take 81 mg by mouth daily.        Marland Kitchen ezetimibe-simvastatin (VYTORIN) 10-40 MG per tablet Take 1 tablet by mouth at bedtime.  28 tablet  0  . levothyroxine (SYNTHROID, LEVOTHROID) 50 MCG tablet take 1 tablet by mouth once daily  30 tablet  5  . metFORMIN (GLUCOPHAGE) 1000 MG tablet take 1 tablet by mouth twice a day with food  60 tablet  2  . Multiple Vitamins-Minerals (CENTRUM SILVER PO) Take 1 tablet by mouth daily.        Marland Kitchen olmesartan-hydrochlorothiazide (BENICAR HCT) 40-25 MG per tablet Take 1 tablet by mouth daily.  21 tablet  0  . omeprazole (PRILOSEC) 20 MG capsule take 1 capsule by mouth once daily  30 capsule  1  . saxagliptin HCl (ONGLYZA) 5 MG TABS tablet Take 1 tablet (5 mg total) by mouth daily.  28 tablet  0  . magnesium hydroxide (MILK OF MAGNESIA) 400 MG/5ML suspension Take 5 mLs by mouth daily as needed.       No current facility-administered medications on file prior to visit.   No Known Allergies  ROS:  Denies fevers,  URI symptoms, headache, cough, shortness of breath, chest pain, bowel changes, bleeding/bruising, skin rash.  See HPI  PHYSICAL EXAM: BP 124/82  Pulse 72  Ht 5\' 6"  (1.676 m)  Wt 185 lb (83.915 kg)  BMI 29.87 kg/m2  Well developed, pleasant female in no distress Neck: no lymphadenopathy or mass Heart: regular rate and rhythm without murmur Lungs: clear Abdomen: soft, nontender, no mass External genitalia: normal, no lesions Bimanual exam is normal--no masses, uterus is normal, adnexa nontender.  No cystocele.  Some hard stool palpable posteriorly Rectal: firm stool in vault, heme negative Back: no spine or CVA tenderness.  Area of unusual sensation is over her sacrum.  Nontender, no mass Skin: hyperpigmentation at skin fold by c-section scar. No erythema, warmth, drainage or satellite lesions Psych: normal mood, affect ,hygiene and grooming Neuro: alert, oriented.  Urine  dip:  1+ leukocytes, trace blood  ASSESSMENT/PLAN:  Urge urinary incontinence - behavioral measures reviewed.  If not improving, consider meds.  Briefly reviewed side effects. doesn't feel diuretics contribute  Incontinence - rule out infection. - Plan: POCT Urinalysis Dipstick, Urine culture  (cut back on caffeine, use restroom more frequently to not allow bladder to get as full; consider wearing pads at night, or during day if significant volume of leakage)  Sacral discomfort, strange feeling.  ?etiology.  Very vague in history.  Only finding on exam was of some hard stool.  Encouraged high fiber diet.  I could find no explanation on exam for her complaint

## 2012-04-25 LAB — URINE CULTURE

## 2012-05-07 ENCOUNTER — Telehealth: Payer: Self-pay | Admitting: Family Medicine

## 2012-05-07 DIAGNOSIS — E782 Mixed hyperlipidemia: Secondary | ICD-10-CM

## 2012-05-07 DIAGNOSIS — I10 Essential (primary) hypertension: Secondary | ICD-10-CM

## 2012-05-07 MED ORDER — EZETIMIBE-SIMVASTATIN 10-40 MG PO TABS: 1.0000 | ORAL_TABLET | Freq: Every day | ORAL | Status: DC

## 2012-05-07 MED ORDER — OLMESARTAN MEDOXOMIL-HCTZ 40-25 MG PO TABS: 1.0000 | ORAL_TABLET | Freq: Every day | ORAL | Status: DC

## 2012-05-07 NOTE — Telephone Encounter (Signed)
Patient wants samples of Benicar and Vytorin , does not know strength  Please call

## 2012-05-07 NOTE — Telephone Encounter (Signed)
Called patient to let her know that I do have samples for her(on my desk in a bag with her name on it) But I do need her to schedule a fasting med check with Dr.Knapp as she is past due. Thanks.

## 2012-05-07 NOTE — Telephone Encounter (Signed)
Okay for samples, BUT she is now due for a fasting med check, and none is scheduled.  Please schedule.  Seen recently for acute visit.  Last med check was with Vincenza Hews, but previously has been seeing me.

## 2012-05-08 NOTE — Telephone Encounter (Signed)
Pt called and set up med check and will come in and pick up samples.

## 2012-05-14 ENCOUNTER — Other Ambulatory Visit: Payer: Self-pay | Admitting: Medical

## 2012-05-21 ENCOUNTER — Ambulatory Visit (INDEPENDENT_AMBULATORY_CARE_PROVIDER_SITE_OTHER): Payer: Federal, State, Local not specified - PPO | Admitting: Family Medicine

## 2012-05-21 ENCOUNTER — Encounter: Payer: Self-pay | Admitting: Family Medicine

## 2012-05-21 VITALS — BP 130/80 | HR 60 | Ht 66.0 in | Wt 186.0 lb

## 2012-05-21 DIAGNOSIS — I10 Essential (primary) hypertension: Secondary | ICD-10-CM

## 2012-05-21 DIAGNOSIS — E119 Type 2 diabetes mellitus without complications: Secondary | ICD-10-CM

## 2012-05-21 DIAGNOSIS — E559 Vitamin D deficiency, unspecified: Secondary | ICD-10-CM

## 2012-05-21 DIAGNOSIS — E782 Mixed hyperlipidemia: Secondary | ICD-10-CM

## 2012-05-21 DIAGNOSIS — E039 Hypothyroidism, unspecified: Secondary | ICD-10-CM

## 2012-05-21 DIAGNOSIS — E7800 Pure hypercholesterolemia, unspecified: Secondary | ICD-10-CM

## 2012-05-21 LAB — COMPREHENSIVE METABOLIC PANEL
CO2: 29 mEq/L (ref 19–32)
Calcium: 9.5 mg/dL (ref 8.4–10.5)
Chloride: 104 mEq/L (ref 96–112)
Creat: 1 mg/dL (ref 0.50–1.10)
Glucose, Bld: 146 mg/dL — ABNORMAL HIGH (ref 70–99)
Total Bilirubin: 0.4 mg/dL (ref 0.3–1.2)
Total Protein: 6.4 g/dL (ref 6.0–8.3)

## 2012-05-21 LAB — LIPID PANEL
HDL: 55 mg/dL (ref >39)
Triglycerides: 89 mg/dL (ref <150)

## 2012-05-21 LAB — POCT GLYCOSYLATED HEMOGLOBIN (HGB A1C): Hemoglobin A1C: 6.7

## 2012-05-21 MED ORDER — SAXAGLIPTIN HCL 5 MG PO TABS: 5.0000 mg | ORAL_TABLET | Freq: Every day | ORAL | Status: DC

## 2012-05-21 MED ORDER — EZETIMIBE-SIMVASTATIN 10-40 MG PO TABS: 1.0000 | ORAL_TABLET | Freq: Every day | ORAL | Status: DC

## 2012-05-21 MED ORDER — OLMESARTAN MEDOXOMIL-HCTZ 40-25 MG PO TABS: 1.0000 | ORAL_TABLET | Freq: Every day | ORAL | Status: DC

## 2012-05-21 MED ORDER — METFORMIN HCL 1000 MG PO TABS: ORAL_TABLET | ORAL | Status: DC

## 2012-05-21 NOTE — Patient Instructions (Addendum)
take Vitamin D 1000 IU every day, longterm Continue all of your other current medications--your diabetes and blood pressure is well controlled.  We will be in touch soon with your lab results to know if your thyroid or cholesterol medications need to be changed or not.  Check with your insurance before your physical to see if they cover the shingles vaccine (zostavax).  If it is covered, we will discuss and give at your physical

## 2012-05-21 NOTE — Progress Notes (Signed)
Chief Complaint  Patient presents with  . Diabetes    fasting med check.   Diabetes follow-up:  Blood sugars at home are running 105-120 fasting.  Denies hypoglycemia.  Denies polydipsia and polyuria.  Last eye exam was 01/2012.  Patient follows a low sugar diet and checks feet regularly without concerns.  Compliant with her medications, and denies side effects. Saw podiatrist recently (had a bruised toe).  Hyperlipidemia follow-up:  Patient is reportedly following a low-fat, low cholesterol diet.  Compliant with medications and denies medication side effects  Hypertension follow-up:  Blood pressures are not checked elsewhere.  Denies dizziness, headaches, chest pain, edema.  Denies side effects of medications.  Hypothyroidism:  Compliant with taking medication, takes by itself first thing in the morning.  She has been having an ongoing issue with hair loss, no recent acute change.  Feeling a little more tired lately.  Denies changes in bowels (has some chronic constipation), or any skin or mood changes (slight fluctuations at times).  Vitamin D deficiency--admits to not taking any vitamin D supplement for about a month.  Urinary incontinence has improved with behavioral measures we discussed at last visit.  Past Medical History  Diagnosis Date  . Hypertension   . Diabetes mellitus   . Hyperlipidemia   . GERD (gastroesophageal reflux disease)   . Premature menopause age 85  . Constipation   . Vitamin D deficiency 2009  . Thyroid disease 200    hypothyroidism   Past Surgical History  Procedure Laterality Date  . Esophagogastroduodenoscopy  [id number]  . Colonoscopy  [id number]  . Cesarean section      x 2  . Tubal ligation     History   Social History  . Marital Status: Widowed    Spouse Name: N/A    Number of Children: N/A  . Years of Education: N/A   Occupational History  . Retired from [employer]    Social History Main Topics  . Smoking status: Never Smoker   . Smokeless  tobacco: Never Used  . Alcohol Use: Yes     Comment: socially maybe 1-2 times monthly  . Drug Use: No  . Sexually Active: Not Currently   Other Topics Concern  . Not on file   Social History Narrative   Moved to [place] with daughter, temporarily (trying it out)--moved back to [facility]   Current outpatient prescriptions:amLODipine (NORVASC) 10 MG tablet, Take 1 tablet (10 mg total) by mouth daily., Disp: 30 tablet, Rfl: 11;  aspirin 81 MG tablet, Take 81 mg by mouth daily.  , Disp: , Rfl: ;  ezetimibe-simvastatin (VYTORIN) 10-40 MG per tablet, Take 1 tablet by mouth at bedtime., Disp: 30 tablet, Rfl: 5;  levothyroxine (SYNTHROID, LEVOTHROID) 50 MCG tablet, take 1 tablet by mouth once daily, Disp: 30 tablet, Rfl: 5 metFORMIN (GLUCOPHAGE) 1000 MG tablet, take 1 tablet by mouth twice a day with food, Disp: 60 tablet, Rfl: 5;  Multiple Vitamins-Minerals (CENTRUM SILVER PO), Take 1 tablet by mouth daily.  , Disp: , Rfl: ;  olmesartan-hydrochlorothiazide (BENICAR HCT) 40-25 MG per tablet, Take 1 tablet by mouth daily., Disp: 30 tablet, Rfl: 5;  omeprazole (PRILOSEC) 20 MG capsule, take 1 capsule by mouth once daily, Disp: 30 capsule, Rfl: 1 saxagliptin HCl (ONGLYZA) 5 MG TABS tablet, Take 1 tablet (5 mg total) by mouth daily., Disp: 30 tablet, Rfl: 5;  magnesium hydroxide (MILK OF MAGNESIA) 400 MG/5ML suspension, Take 5 mLs by mouth daily as needed., Disp: , Rfl:  No Known Allergies  ROS:  Denies fevers, headaches, dizziness, URI symptoms, chest pain, palpitations, nausea, vomiting, heartburn, blood in stools, urinary complaints, numbness/tingling, rashes/lesions bleeding/bruising; moods stable.  Denies cough, shortness of breath. Itching on abdomen has improved.    PHYSICAL EXAM: BP 130/80  Pulse 60  Ht 5\' 6"  (1.676 m)  Wt 186 lb (84.369 kg)  BMI 30.04 kg/m2 Pleasant female in no distress HEENT:  PERRL, conjunctiva clear Neck: no lymphadenopathy or thyromegaly, no carotid bruit  Heart: regular  rate and rhythm without murmur Lungs: clear bilaterally  Abdomen: soft, nontender, no mass  Extremities: no edema, 2+ pulse.  Normal diabetic foot exam Skin: no lesions  Psych: full range of affect, normal hygiene, grooming Neuro: alert and oriented.  Normal gait.  Cranial nerves grossly intact.  A1c 6.7  ASSESSMENT/PLAN:  Type II or unspecified type diabetes mellitus without mention of complication, not stated as uncontrolled - improved control--continue current meds, diet - Plan: HgB A1c, Comprehensive metabolic panel, saxagliptin HCl (ONGLYZA) 5 MG TABS tablet, metFORMIN (GLUCOPHAGE) 1000 MG tablet, HM Diabetes Foot Exam  Essential hypertension, benign - controlled - Plan: Comprehensive metabolic panel, olmesartan-hydrochlorothiazide (BENICAR HCT) 40-25 MG per tablet  Pure hypercholesterolemia - due for labs.  If borderline, consider changing to atorvastatin given potential interaction with amlodipine - Plan: Lipid panel, Comprehensive metabolic panel  Unspecified hypothyroidism - due for recheck - Plan: TSH  Unspecified vitamin D deficiency - reminded to take Vitamin D 1000 IU every day, longterm - Plan: Vitamin D 25 hydroxy  Mixed hyperlipidemia - Plan: ezetimibe-simvastatin (VYTORIN) 10-40 MG per tablet   Discussed potential interaction with Vytorin and amlodipine.  She has been on these meds/doses for years without problems.  Discussed leaving alone if at goal, vs considering changing to Lipitor (vs liptruzet if not at goal on lipitor alone). She doesn't recall having tried or had problems with lipitor in the past.  HTN--at goal. Continue current medications  F/u 6 months for CPE/med check (fasting)

## 2012-05-22 LAB — VITAMIN D 25 HYDROXY (VIT D DEFICIENCY, FRACTURES): Vit D, 25-Hydroxy: 18 ng/mL — ABNORMAL LOW (ref 30–89)

## 2012-05-27 ENCOUNTER — Other Ambulatory Visit: Payer: Self-pay | Admitting: *Deleted

## 2012-05-27 ENCOUNTER — Telehealth: Payer: Self-pay | Admitting: Internal Medicine

## 2012-05-27 ENCOUNTER — Other Ambulatory Visit: Payer: Self-pay

## 2012-05-27 DIAGNOSIS — E559 Vitamin D deficiency, unspecified: Secondary | ICD-10-CM

## 2012-05-27 DIAGNOSIS — Z1231 Encounter for screening mammogram for malignant neoplasm of breast: Secondary | ICD-10-CM

## 2012-05-27 MED ORDER — ERGOCALCIFEROL 1.25 MG (50000 UT) PO CAPS: 50000.0000 [IU] | ORAL_CAPSULE | ORAL | Status: DC

## 2012-05-27 NOTE — Telephone Encounter (Signed)
Pt would like to have a mammogram done. And would like to be referred

## 2012-05-27 NOTE — Telephone Encounter (Signed)
No referral is necessary.  Okay to help her schedule if needed.  Please do wherever she had prior, and if none local, [facility]

## 2012-05-27 NOTE — Telephone Encounter (Signed)
Called patient to let her know that I was able to help her schedule mammogram, she already called and scheduled one for 07/01/12. Told patient to call me back if I could help her in any way.

## 2012-06-17 ENCOUNTER — Telehealth: Payer: Self-pay | Admitting: Internal Medicine

## 2012-06-17 NOTE — Telephone Encounter (Signed)
Pt states that she needs benicar 40-25 and vytorin 10-40  Samples. That she can not afford med right now from pharmacy gave her #28 tablets

## 2012-07-01 ENCOUNTER — Ambulatory Visit
Admission: RE | Admit: 2012-07-01 | Discharge: 2012-07-01 | Disposition: A | Discharge status: Home / Self Care | Payer: Federal, State, Local not specified - PPO | Source: Ambulatory Visit

## 2012-07-01 DIAGNOSIS — Z1231 Encounter for screening mammogram for malignant neoplasm of breast: Secondary | ICD-10-CM

## 2012-07-01 LAB — HM MAMMOGRAPHY: HM Mammogram: NORMAL

## 2012-07-01 IMAGING — MG MM DIGITAL SCREENING BILAT
5 series · 5 of 5 positions shown · non-contrast
Comparison: Previous exams.

CLINICAL DATA: Screening.

DIGITAL SCREENING BILATERAL MAMMOGRAM WITH CAD

[R MLO]
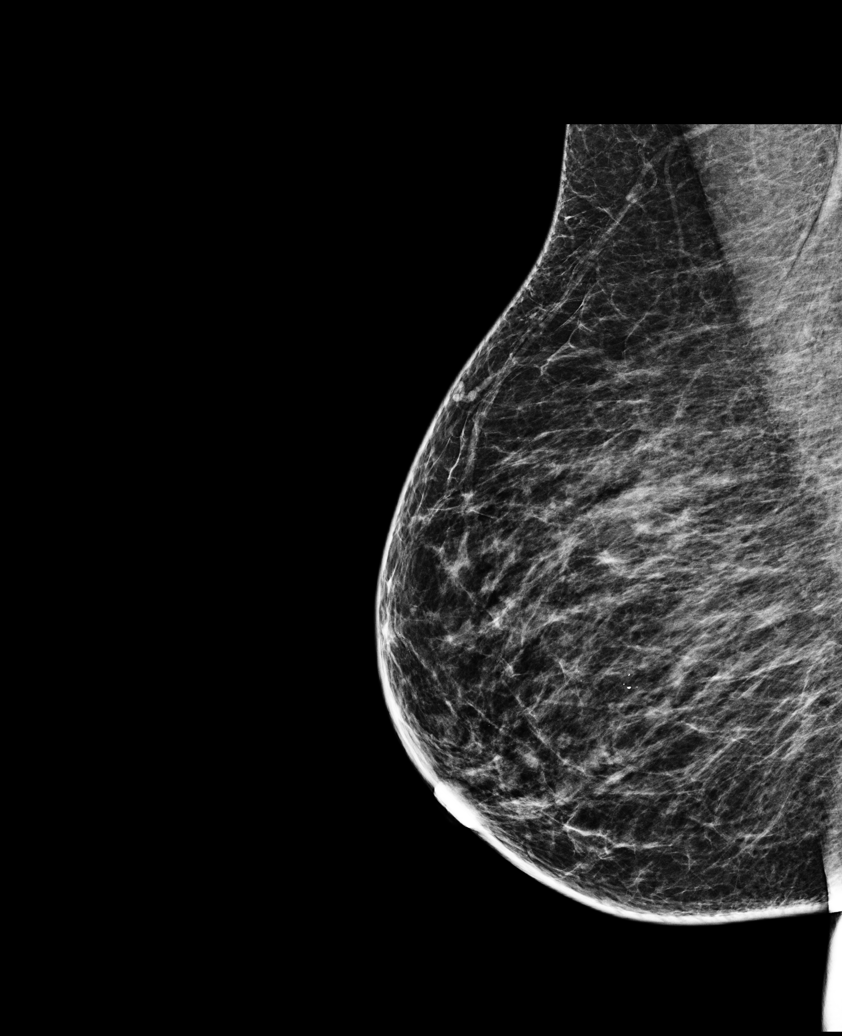

[L CC]
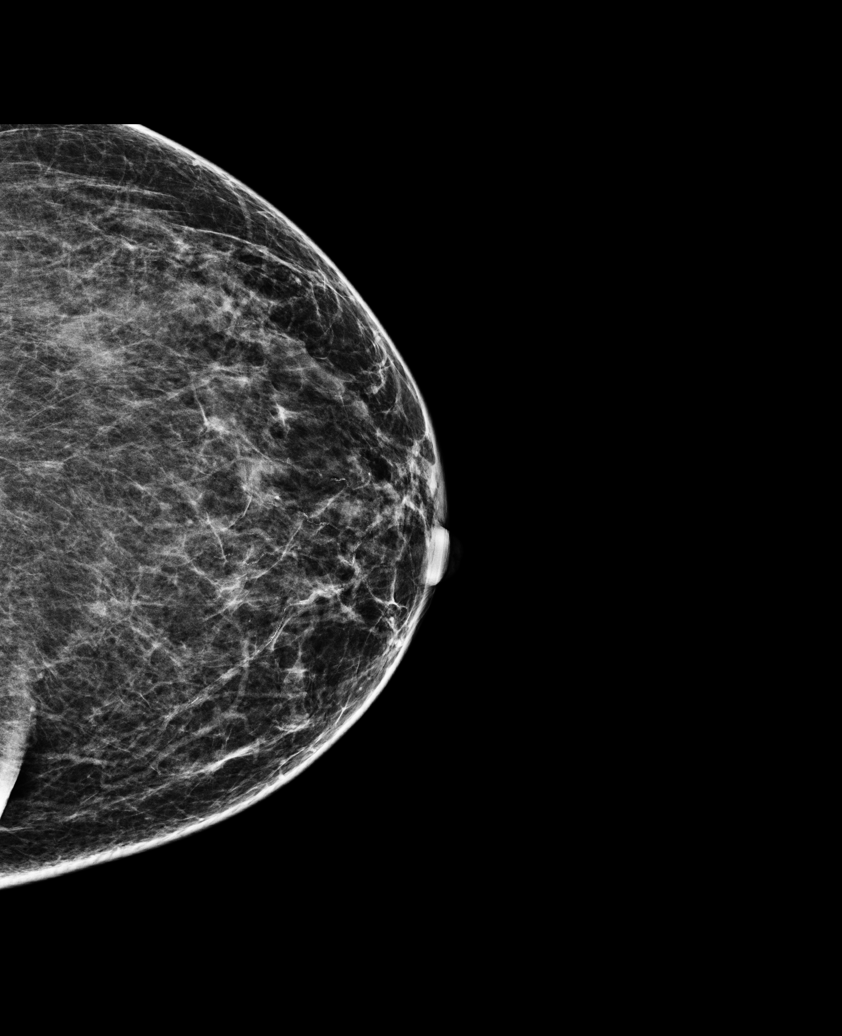

[L MLO (1 of 2)]
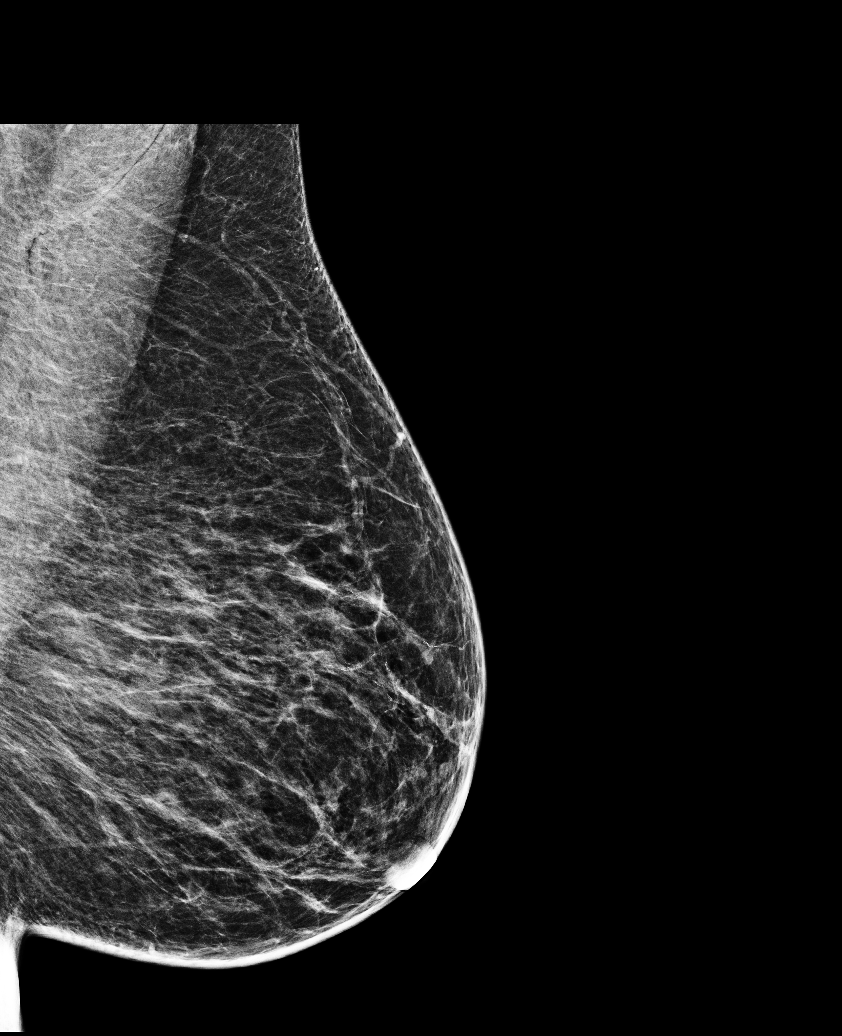

[L MLO (2 of 2)]
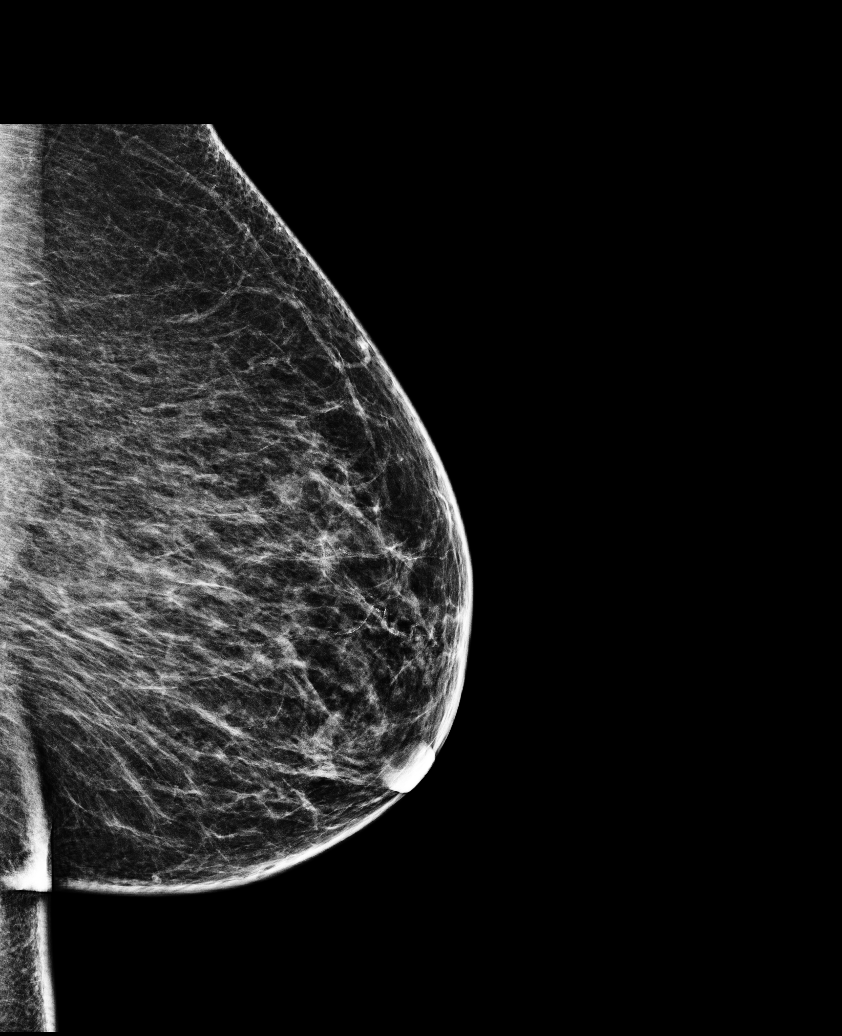

[R CC]
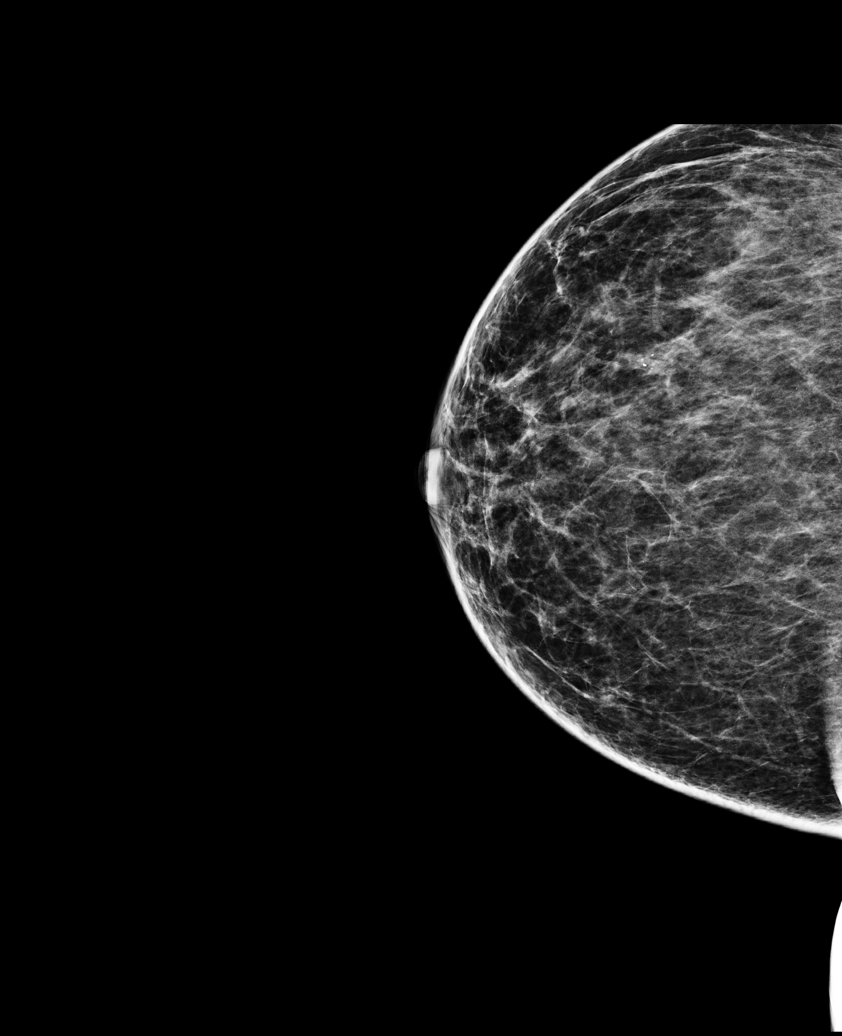

[5 of 5 positions shown; findings below may reference images not displayed]

FINDINGS: ACR Breast Density Category c:  The breasts are heterogeneously
dense, which may obscure small masses.

There are no findings suspicious for malignancy.

Images were processed with CAD.
IMPRESSION: No mammographic evidence of malignancy.

A result letter of this screening mammogram will be mailed directly
to the patient.

RECOMMENDATION:
Screening mammogram in one year. (Code:[id number])

BI-RADS CATEGORY 1:  Negative.

## 2012-07-02 ENCOUNTER — Ambulatory Visit (INDEPENDENT_AMBULATORY_CARE_PROVIDER_SITE_OTHER): Payer: Federal, State, Local not specified - PPO | Admitting: Family Medicine

## 2012-07-02 ENCOUNTER — Encounter: Payer: Self-pay | Admitting: Family Medicine

## 2012-07-02 VITALS — BP 120/70 | HR 79 | Wt 183.0 lb

## 2012-07-02 DIAGNOSIS — M549 Dorsalgia, unspecified: Secondary | ICD-10-CM

## 2012-07-02 DIAGNOSIS — K5900 Constipation, unspecified: Secondary | ICD-10-CM

## 2012-07-02 NOTE — Patient Instructions (Addendum)
Take 2 Aleve twice per day for the next week and see what that will do to eliminate this I would also recommend a bulk laxative like Metamucil with plenty of fluids to work with your constipation

## 2012-07-02 NOTE — Progress Notes (Signed)
  Subjective:    Patient ID: Ann Weiss, female    DOB: 1950/08/25, 62 y.o.   MRN: [id number]  HPI She complains of a several day history of left-sided pain with radiation down her leg laterally. She cannot relate this to any particular activities. No history of injury. No urinary symptoms. She does complain of difficulty with constipation stating she has a BM every 2 weeks. Apparently in the past she did have a colonoscopy but it was incomplete due to incomplete emptying of stool.   Review of Systems     Objective:   Physical Exam Alert and in no distress. Normal lumbar motion with no tenderness to palpation over the lumbar spine, SI joints or sciatic notch. Full hip motion. Negative straight leg raising. Faber test negative.       Assessment & Plan:  Back pain  Constipation  recommend conservative care with 2 Aleve twice per day for the next 7-10 days. Also recommended bulk laxatives to help with her constipation and followup with Dr. Lynelle Doctor.

## 2012-08-05 ENCOUNTER — Telehealth: Payer: Self-pay | Admitting: Family Medicine

## 2012-08-05 DIAGNOSIS — E782 Mixed hyperlipidemia: Secondary | ICD-10-CM

## 2012-08-05 DIAGNOSIS — I10 Essential (primary) hypertension: Secondary | ICD-10-CM

## 2012-08-05 MED ORDER — OLMESARTAN MEDOXOMIL-HCTZ 40-25 MG PO TABS: 1.0000 | ORAL_TABLET | Freq: Every day | ORAL | Status: DC

## 2012-08-05 MED ORDER — EZETIMIBE-SIMVASTATIN 10-40 MG PO TABS: 1.0000 | ORAL_TABLET | Freq: Every day | ORAL | Status: DC

## 2012-08-05 NOTE — Telephone Encounter (Signed)
Done and called patient

## 2012-08-10 ENCOUNTER — Other Ambulatory Visit: Payer: Self-pay | Admitting: Medical

## 2012-08-19 ENCOUNTER — Other Ambulatory Visit: Payer: Self-pay | Admitting: Medical

## 2012-08-19 ENCOUNTER — Other Ambulatory Visit: Payer: Self-pay | Admitting: Family Medicine

## 2012-08-19 ENCOUNTER — Telehealth: Payer: Self-pay | Admitting: Family Medicine

## 2012-08-19 DIAGNOSIS — I10 Essential (primary) hypertension: Secondary | ICD-10-CM

## 2012-08-19 MED ORDER — OMEPRAZOLE 20 MG PO CPDR: DELAYED_RELEASE_CAPSULE | ORAL | Status: DC

## 2012-08-19 MED ORDER — AMLODIPINE BESYLATE 10 MG PO TABS: 10.0000 mg | ORAL_TABLET | Freq: Every day | ORAL | Status: DC

## 2012-08-19 NOTE — Telephone Encounter (Signed)
PT HAD A MEDCHECK ON 05/21/2012. SHE IS REQUESTING REFILLS ON PRILOSEC AND NORVASC. SEND INTO [facility] [address]

## 2012-08-19 NOTE — Telephone Encounter (Signed)
done

## 2012-09-17 ENCOUNTER — Telehealth: Payer: Self-pay | Admitting: Internal Medicine

## 2012-09-17 DIAGNOSIS — E782 Mixed hyperlipidemia: Secondary | ICD-10-CM

## 2012-09-17 DIAGNOSIS — I10 Essential (primary) hypertension: Secondary | ICD-10-CM

## 2012-09-17 MED ORDER — EZETIMIBE-SIMVASTATIN 10-40 MG PO TABS: 1.0000 | ORAL_TABLET | Freq: Every day | ORAL | Status: DC

## 2012-09-17 MED ORDER — OLMESARTAN MEDOXOMIL-HCTZ 40-25 MG PO TABS: 1.0000 | ORAL_TABLET | Freq: Every day | ORAL | Status: DC

## 2012-09-17 NOTE — Telephone Encounter (Signed)
Pt called asking for samples on benicar and vytorin

## 2012-11-09 ENCOUNTER — Other Ambulatory Visit: Payer: Self-pay | Admitting: *Deleted

## 2012-11-09 ENCOUNTER — Telehealth: Payer: Self-pay | Admitting: Family Medicine

## 2012-11-09 DIAGNOSIS — I10 Essential (primary) hypertension: Secondary | ICD-10-CM

## 2012-11-09 DIAGNOSIS — E782 Mixed hyperlipidemia: Secondary | ICD-10-CM

## 2012-11-09 MED ORDER — OLMESARTAN MEDOXOMIL-HCTZ 40-25 MG PO TABS: 1.0000 | ORAL_TABLET | Freq: Every day | ORAL | Status: DC

## 2012-11-09 MED ORDER — EZETIMIBE-SIMVASTATIN 10-40 MG PO TABS: 1.0000 | ORAL_TABLET | Freq: Every day | ORAL | Status: DC

## 2012-11-09 NOTE — Telephone Encounter (Signed)
Patient informed. 

## 2012-11-10 NOTE — Telephone Encounter (Signed)
Samples given.  

## 2012-11-16 ENCOUNTER — Telehealth: Payer: Self-pay | Admitting: *Deleted

## 2012-11-16 ENCOUNTER — Telehealth: Payer: Self-pay | Admitting: Family Medicine

## 2012-11-16 NOTE — Telephone Encounter (Signed)
Patient informed. 

## 2012-11-16 NOTE — Telephone Encounter (Signed)
Do we have?? Advise pt.  I'm not aware of anywhere for "free test strips"--she can check with her insurance (?)

## 2012-11-17 NOTE — Telephone Encounter (Signed)
Done

## 2012-11-23 ENCOUNTER — Ambulatory Visit (INDEPENDENT_AMBULATORY_CARE_PROVIDER_SITE_OTHER): Payer: Federal, State, Local not specified - PPO | Admitting: Family Medicine

## 2012-11-23 ENCOUNTER — Encounter: Payer: Self-pay | Admitting: Family Medicine

## 2012-11-23 VITALS — BP 134/76 | HR 60 | Ht 66.0 in | Wt 184.0 lb

## 2012-11-23 DIAGNOSIS — I10 Essential (primary) hypertension: Secondary | ICD-10-CM

## 2012-11-23 DIAGNOSIS — R5381 Other malaise: Secondary | ICD-10-CM

## 2012-11-23 DIAGNOSIS — M25519 Pain in unspecified shoulder: Secondary | ICD-10-CM

## 2012-11-23 DIAGNOSIS — M25511 Pain in right shoulder: Secondary | ICD-10-CM

## 2012-11-23 DIAGNOSIS — R350 Frequency of micturition: Secondary | ICD-10-CM

## 2012-11-23 DIAGNOSIS — Z0000 Encounter for general adult medical examination without abnormal findings: Secondary | ICD-10-CM

## 2012-11-23 DIAGNOSIS — IMO0002 Reserved for concepts with insufficient information to code with codable children: Secondary | ICD-10-CM

## 2012-11-23 DIAGNOSIS — E559 Vitamin D deficiency, unspecified: Secondary | ICD-10-CM

## 2012-11-23 DIAGNOSIS — E039 Hypothyroidism, unspecified: Secondary | ICD-10-CM

## 2012-11-23 DIAGNOSIS — K219 Gastro-esophageal reflux disease without esophagitis: Secondary | ICD-10-CM | POA: Insufficient documentation

## 2012-11-23 DIAGNOSIS — E7800 Pure hypercholesterolemia, unspecified: Secondary | ICD-10-CM

## 2012-11-23 DIAGNOSIS — F325 Major depressive disorder, single episode, in full remission: Secondary | ICD-10-CM | POA: Insufficient documentation

## 2012-11-23 DIAGNOSIS — E119 Type 2 diabetes mellitus without complications: Secondary | ICD-10-CM

## 2012-11-23 DIAGNOSIS — Z23 Encounter for immunization: Secondary | ICD-10-CM

## 2012-11-23 DIAGNOSIS — E041 Nontoxic single thyroid nodule: Secondary | ICD-10-CM

## 2012-11-23 LAB — COMPREHENSIVE METABOLIC PANEL
AST: 18 U/L (ref 0–37)
Albumin: 4.4 g/dL (ref 3.5–5.2)
BUN: 13 mg/dL (ref 6–23)
CO2: 30 mEq/L (ref 19–32)
Calcium: 9.6 mg/dL (ref 8.4–10.5)
Chloride: 103 mEq/L (ref 96–112)
Glucose, Bld: 105 mg/dL — ABNORMAL HIGH (ref 70–99)
Potassium: 3.8 mEq/L (ref 3.5–5.3)

## 2012-11-23 LAB — CBC WITH DIFFERENTIAL/PLATELET
Basophils Absolute: 0.1 10*3/uL (ref 0.0–0.1)
Eosinophils Absolute: 0.5 10*3/uL (ref 0.0–0.7)
Eosinophils Relative: 8 % — ABNORMAL HIGH (ref 0–5)
Lymphs Abs: 1.9 10*3/uL (ref 0.7–4.0)
MCH: 30.4 pg (ref 26.0–34.0)
MCV: 89.9 fL (ref 78.0–100.0)
Monocytes Relative: 6 % (ref 3–12)
Platelets: 197 10*3/uL (ref 150–400)
RBC: 4.34 MIL/uL (ref 3.87–5.11)
RDW: 13.6 % (ref 11.5–15.5)

## 2012-11-23 LAB — POCT URINALYSIS DIPSTICK
Bilirubin, UA: NEGATIVE
Protein, UA: NEGATIVE
Spec Grav, UA: 1.015
Urobilinogen, UA: NEGATIVE

## 2012-11-23 LAB — LIPID PANEL
Cholesterol: 150 mg/dL (ref 0–200)
Total CHOL/HDL Ratio: 2.5 Ratio
Triglycerides: 75 mg/dL (ref <150)
VLDL: 15 mg/dL (ref 0–40)

## 2012-11-23 MED ORDER — SAXAGLIPTIN HCL 5 MG PO TABS: 5.0000 mg | ORAL_TABLET | Freq: Every day | ORAL | Status: DC

## 2012-11-23 MED ORDER — AMLODIPINE BESYLATE 10 MG PO TABS: 10.0000 mg | ORAL_TABLET | Freq: Every day | ORAL | Status: DC

## 2012-11-23 MED ORDER — OMEPRAZOLE 20 MG PO CPDR: DELAYED_RELEASE_CAPSULE | ORAL | Status: DC

## 2012-11-23 NOTE — Patient Instructions (Addendum)
HEALTH MAINTENANCE RECOMMENDATIONS:  It is recommended that you get at least 30 minutes of aerobic exercise at least 5 days/week (for weight loss, you may need as much as 60-90 minutes). This can be any activity that gets your heart rate up. This can be divided in 10-15 minute intervals if needed, but try and build up your endurance at least once a week.  Weight bearing exercise is also recommended twice weekly.  Eat a healthy diet with lots of vegetables, fruits and fiber.  "Colorful" foods have a lot of vitamins (ie green vegetables, tomatoes, red peppers, etc).  Limit sweet tea, regular sodas and alcoholic beverages, all of which has a lot of calories and sugar.  Up to 1 alcoholic drink daily may be beneficial for women (unless trying to lose weight, watch sugars).  Drink a lot of water.  Calcium recommendations are 1200-1500 mg daily (1500 mg for postmenopausal women or women without ovaries), and vitamin D 1000 IU daily.  This should be obtained from diet and/or supplements (vitamins), and calcium should not be taken all at once, but in divided doses.  Monthly self breast exams and yearly mammograms for women over the age of 43 is recommended.  Sunscreen of at least SPF 30 should be used on all sun-exposed parts of the skin when outside between the hours of 10 am and 4 pm (not just when at beach or pool, but even with exercise, golf, tennis, and yard work!)  Use a sunscreen that says "broad spectrum" so it covers both UVA and UVB rays, and make sure to reapply every 1-2 hours.  Remember to change the batteries in your smoke detectors when changing your clock times in the spring and fall.  Use your seat belt every time you are in a car, and please drive safely and not be distracted with cell phones and texting while driving.  Please make sure to take 1000-2000 IU of Vitamin D3 every day.  We may be putting you back on a prescription vitamin D, depending on your labs. If we do, then you can wait  to start this over-the-counter vitamin until after you complete the prescription.  This vitamin needs to be taken daily, longterm.  Please call Dr. Jennye Boroughs office to reschedule colonoscopy.  Consider trial of miralax to see if emptying colon more frequently helps with the lower back sensation

## 2012-11-23 NOTE — Progress Notes (Signed)
Chief Complaint  Patient presents with  . Annual Exam    fasting annual with pap. Did not do eye exam, she has one scheduled for Jan 2015. UA showed 2+ leuks and trace blood, patient is asymptomatic. Several things patient wants to discuss today :itching in genital area x 6 months, itching in ears B/L x 6 months off and on, right shoulder discomfort  x 1 month, also has an organsmic in her rectal area that causes a rash.    Ann Weiss is a 62 y.o. female who presents for a complete physical.  She also presents for med check/follow up on her chronic medical problems, and has the following concerns:  She is having an "orgasmic feeling"--like it almost makes her knees buckle--at the sacral area.  Feeling is unusual, not painful.  She then rubs the area, which then causes a rash, from constantly rubbing it.  She has been having the feeling for over 6 months, most days.  Can occur a few times per day, sometimes when sitting, sometimes when lying down. She feels like the sensation is internal, not related to the skin or muscles.  Right shoulder pain in early morning hours, not daily, "discomfort" which only lasts 5 minutes, eventually goes away.  She can wake up with the pain.  Denies having any pain related to activities during the day.  Diabetes follow-up: Blood sugars at home are running 89-114 fasting. Denies hypoglycemia. Denies polydipsia and polyuria. Last eye exam was 01/2012. Patient follows a low sugar diet and checks feet regularly without concerns. Compliant with her medications, and denies side effects.   Hyperlipidemia follow-up: Patient is reportedly following a low-fat, low cholesterol diet. Compliant with medications and denies medication side effects   Hypertension follow-up: Blood pressures are not checked elsewhere. Denies dizziness, headaches, chest pain, edema. Denies side effects of medications.   Hypothyroidism: Compliant with taking medication, takes by itself first thing in  the morning. No further hair loss, but trying to get it to grow back. Feeling a little more tired lately. Denies changes in bowels (has some chronic constipation), or any skin or mood changes  Vitamin D deficiency--admits to not taking any vitamin D supplements since completing the last prescription  GERD:  Well controlled with daily omeprazole.  She can tell if she misses a day (mild recurrent symptoms).  Denies dysphagia.  Immunization History  Administered Date(s) Administered  . Influenza Split 11/21/2011  . Influenza,inj,Quad PF,36+ Mos 11/23/2012  . Pneumococcal Polysaccharide 03/18/2006  . Tdap 05/19/2007   Last Pap smear: 05/2010  Last mammogram: 06/2012 Last colonoscopy: 04/2010--poor prep.  Was supposed to return for repeat, but she never did (Dr. Kinnie Scales) Last DEXA: 01/2010  Eye exam: 01/2012 Dentist: every 3 months Exercise: she had to stop going to the gym when she started back going to school.  She is on her feet a lot during the day, [place], and [place] a lot.  Past Medical History  Diagnosis Date  . Hypertension   . Diabetes mellitus   . Hyperlipidemia   . GERD (gastroesophageal reflux disease)   . Premature menopause age 21  . Constipation   . Vitamin D deficiency 2009  . Thyroid disease     hypothyroidism    Past Surgical History  Procedure Laterality Date  . Esophagogastroduodenoscopy  [id number]  . Colonoscopy  [id number]  . Cesarean section      x 2  . Tubal ligation      History   Social History  .  Marital Status: Widowed    Spouse Name: N/A    Number of Children: N/A  . Years of Education: N/A   Occupational History  . Retired from [employer]    Social History Main Topics  . Smoking status: Never Smoker   . Smokeless tobacco: Never Used  . Alcohol Use: Yes     Comment: socially maybe 1-2 times monthly  . Drug Use: No  . Sexual Activity: Not Currently   Other Topics Concern  . Not on file   Social History Narrative   Lives alone.   Daughter lives in [place] and other daughter lives in [place], [place]    Family History  Problem Relation Age of Onset  . Hypertension Mother   . Kidney disease Mother     renal failure, dialysis  . Hypertension Father   . Stroke Father   . Arthritis Maternal Aunt   . Heart disease Maternal Aunt   . Diabetes Maternal Grandmother   . Cancer Neg Hx   . Breast cancer Neg Hx   . Colon cancer Neg Hx     Current outpatient prescriptions:amLODipine (NORVASC) 10 MG tablet, Take 1 tablet (10 mg total) by mouth daily., Disp: 30 tablet, Rfl: 5;  aspirin 81 MG tablet, Take 81 mg by mouth daily.  , Disp: , Rfl: ;  ezetimibe-simvastatin (VYTORIN) 10-40 MG per tablet, Take 1 tablet by mouth at bedtime., Disp: 28 tablet, Rfl: 0;  levothyroxine (SYNTHROID, LEVOTHROID) 50 MCG tablet, take 1 tablet by mouth once daily, Disp: 30 tablet, Rfl: 5 metFORMIN (GLUCOPHAGE) 1000 MG tablet, take 1 tablet by mouth twice a day with food, Disp: 60 tablet, Rfl: 5;  Multiple Vitamins-Minerals (CENTRUM SILVER PO), Take 1 tablet by mouth daily.  , Disp: , Rfl: ;  olmesartan-hydrochlorothiazide (BENICAR HCT) 40-25 MG per tablet, Take 1 tablet by mouth daily., Disp: 28 tablet, Rfl: 0;  omeprazole (PRILOSEC) 20 MG capsule, take 1 capsule by mouth once daily, Disp: 30 capsule, Rfl: 11 saxagliptin HCl (ONGLYZA) 5 MG TABS tablet, Take 1 tablet (5 mg total) by mouth daily., Disp: 30 tablet, Rfl: 5;  magnesium hydroxide (MILK OF MAGNESIA) 400 MG/5ML suspension, Take 5 mLs by mouth daily as needed., Disp: , Rfl:   No Known Allergies  Review of Systems  The patient denies anorexia, fever, weight changes, headaches, vision changes, decreased hearing, ear pain, sore throat, breast concerns, chest pain, palpitations, dizziness, syncope, dyspnea on exertion, cough, swelling, nausea, vomiting, diarrhea, constipation (some chronic/infrequent BM's but no straining), no abdominal pain, melena, hematochezia, indigestion/heartburn, hematuria, incontinence,  dysuria, vaginal bleeding, discharge, odor or itch genital lesions, joint pains (except R shoulder pain as per HPI), numbness, tingling (resolved), weakness, tremor, suspicious skin lesions, depression, anxiety, abnormal bleeding/bruising, or enlarged lymph nodes.  +ROS: +Fatigue, occasional trouble sleeping which likely contributes. +itching around c-section scar  PHYSICAL EXAM:  BP 140/94  Pulse 60  Ht 5\' 6"  (1.676 m)  Wt 184 lb (83.462 kg)  BMI 29.71 kg/m2 134/76 on repeat by MD  General Appearance:  Alert, cooperative, no distress, appears stated age   Head:  Normocephalic, without obvious abnormality, atraumatic   Eyes:  PERRL, conjunctiva/corneas clear, EOM's intact, fundi  benign   Ears:  Normal TM's and external ear canals   Nose:  Nares normal, mucosa normal, no drainage or sinus tenderness   Throat:  Lips, mucosa, and tongue normal; teeth and gums normal   Neck:  Supple, no lymphadenopathy; thyroid: enlarged, with nodule noted on the right; no carotid  bruit or JVD   Back:  Spine nontender, no curvature, ROM normal, no CVA tenderness. nontender at SI joints.  Area of "orgasmic sensation" is over sacrum--normal exam  Lungs:  Clear to auscultation bilaterally without wheezes, rales or ronchi; respirations unlabored   Chest Wall:  No tenderness or deformity   Heart:  Regular rate and rhythm, S1 and S2 normal, no murmur, rub  or gallop   Breast Exam:  No tenderness, masses, or nipple discharge or inversion. No axillary lymphadenopathy   Abdomen:  Soft, non-tender, nondistended, normoactive bowel sounds,  no masses, no hepatosplenomegaly   Genitalia:  Normal external genitalia without lesions. BUS and vagina normal; no cervical motion tenderness.  Uterus and adnexa not enlarged, nontender, no masses. Pap not performed   Rectal:  Normal tone, no masses or tenderness; guaiac negative stool.   Extremities:  No clubbing, cyanosis or edema. FROM of shoulders without pain  Pulses:  2+  and symmetric all extremities   Skin:  Skin color, texture, turgor normal, no rashes or lesions. Some hyperpigmentation at c-section scar   Lymph nodes:  Cervical, supraclavicular, and axillary nodes normal   Neurologic:  CNII-XII intact, normal strength, sensation and gait; reflexes 2+ and symmetric throughout           Psych: Normal mood, affect, hygiene and grooming.   ASSESSMENT/PLAN:  Routine general medical examination at a health care facility - Plan: POCT Urinalysis Dipstick  Need for prophylactic vaccination and inoculation against influenza - Plan: Flu Vaccine QUAD 36+ mos IM  Type II or unspecified type diabetes mellitus without mention of complication, not stated as uncontrolled - controlled - Plan: HgB A1c, Comprehensive metabolic panel, Microalbumin / creatinine urine ratio, HM Diabetes Foot Exam, saxagliptin HCl (ONGLYZA) 5 MG TABS tablet  Unspecified hypothyroidism - euthyroid by history - Plan: TSH  Unspecified vitamin D deficiency - recheck today.  noncompliant with OTC supplementation, so suspect it will be low.  discussed importance of longterm OTC supplementation - Plan: Vit D  25 hydroxy (rtn osteoporosis monitoring)  Pure hypercholesterolemia - Plan: Lipid panel, Comprehensive metabolic panel  Essential hypertension, benign - controlled - Plan: Comprehensive metabolic panel, amLODipine (NORVASC) 10 MG tablet  Other malaise and fatigue - check labs.  may be related to poor sleep (contributed by shoulder pain) - Plan: TSH, Vit D  25 hydroxy (rtn osteoporosis monitoring), CBC with Differential  Urinary frequency - check urine culture due ot abnormal urine dip (per pt request).  minimally symptomatic - Plan: Urine culture  Right thyroid nodule - recheck ultrasound, to rule out a dominant nodule - Plan: US Soft Tissue Head/Neck  GERD (gastroesophageal reflux disease) - well controlled on PPI - Plan: omeprazole (PRILOSEC) 20 MG capsule  Major depression in  remission  Right shoulder pain   Discussed monthly self breast exams and yearly mammograms; at least 30 minutes of aerobic activity at least 5 days/week; proper sunscreen use reviewed; healthy diet, including goals of calcium and vitamin D intake and alcohol recommendations (less than or equal to 1 drink/day) reviewed; regular seatbelt use; changing batteries in smoke detectors.  Immunization recommendations discussed. Reviewed risks/benefits of zostavax.  She has checked with her insurance and knows it is covered, but doesn't want it today; flu shot given today. Colonoscopy recommendations reviewed--she is past due (last had poor prep) Pap not due until next year. Sacral discomfort/sensation--? Etiology.  No evidence of abscess or other abnormality noted on exam.  Encouraged to reschedule colonoscopy.  Consider trial of miralax  to see if emptying colon more frequently helps with the lower back sensation  Right shoulder pain--consider trying to sleep in a different position.  Discussed DDx.  Return if having more pain during the day, for re-eval, might need x-ray, vs eval for other etiology  Thyroid nodule, right-side.  Recheck ultrasound.  Compare to prior (prev noted nodule in 2004).  Pruritis at c-section scar.  This is at a skin fold, so possibly related to moisture.  Recommended use of medicated powder, keep the area clean and dry. No evidence of fungal infection at the present time.  F/u 6 months--med check.  A1c and LFT's (nonfasting, unless lipids off this time).  Vitamin D will likely also need to be repeated at visit (depending on results from today)  Return in 1-2 months (at her convenience) for nurse visit for zostavax

## 2012-11-24 LAB — VITAMIN D 25 HYDROXY (VIT D DEFICIENCY, FRACTURES): Vit D, 25-Hydroxy: 28 ng/mL — ABNORMAL LOW (ref 30–89)

## 2012-11-24 LAB — MICROALBUMIN / CREATININE URINE RATIO: Microalb, Ur: 1.73 mg/dL (ref 0.00–1.89)

## 2012-11-25 ENCOUNTER — Other Ambulatory Visit: Payer: Federal, State, Local not specified - PPO

## 2012-11-25 LAB — URINE CULTURE: Colony Count: NO GROWTH

## 2012-11-26 ENCOUNTER — Ambulatory Visit
Admission: RE | Admit: 2012-11-26 | Discharge: 2012-11-26 | Disposition: A | Discharge status: Home / Self Care | Payer: Federal, State, Local not specified - PPO | Source: Ambulatory Visit | Attending: Family Medicine | Admitting: Family Medicine

## 2012-11-26 ENCOUNTER — Other Ambulatory Visit: Payer: Self-pay | Admitting: *Deleted

## 2012-11-26 DIAGNOSIS — E041 Nontoxic single thyroid nodule: Secondary | ICD-10-CM

## 2012-11-26 IMAGING — US US SOFT TISSUE HEAD/NECK
1 series · 13 of 25 positions shown · non-contrast
Comparison: Ultrasound of the thyroid of 05/02/2009

CLINICAL DATA: Followup of prominent thyroid

EXAM:
THYROID ULTRASOUND
TECHNIQUE: Ultrasound examination of the thyroid gland and adjacent soft
tissues was performed.

[Series 1: us soft tissue head/neck · 0.06mm/px · 13 of 42 slices shown]
[im 1/42]
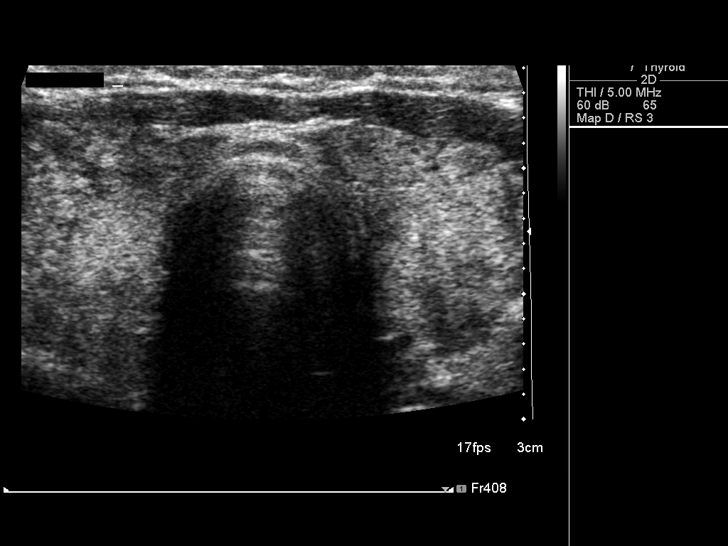
[im 4/42]
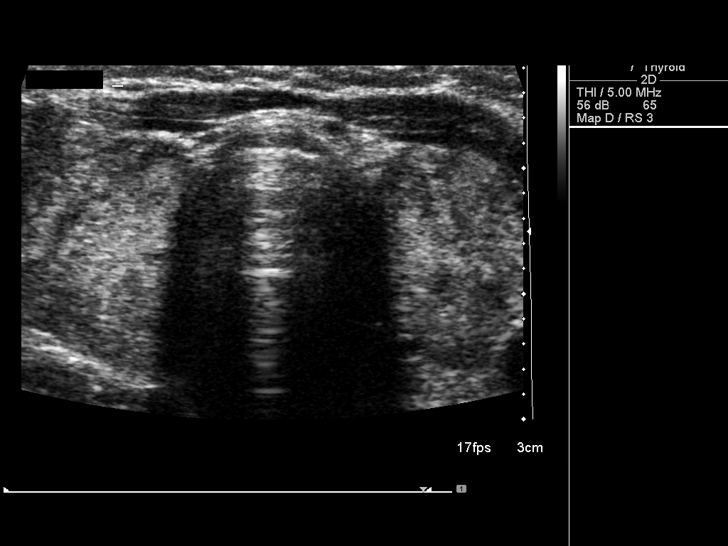
[im 7/42]
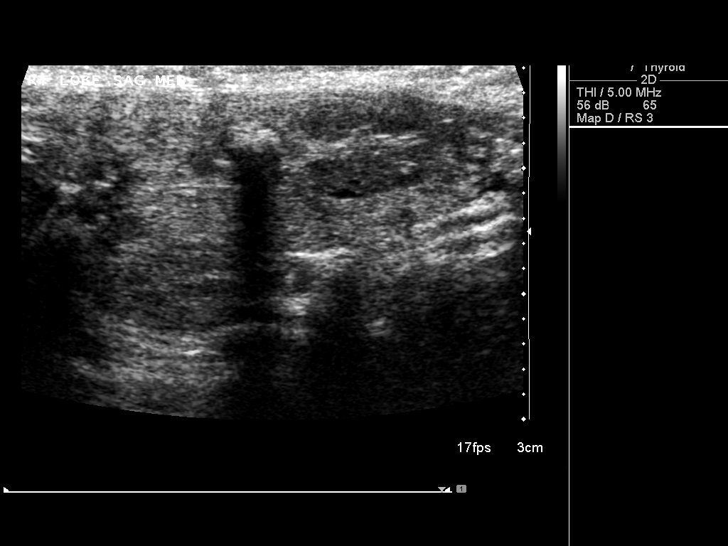
[im 11/42]
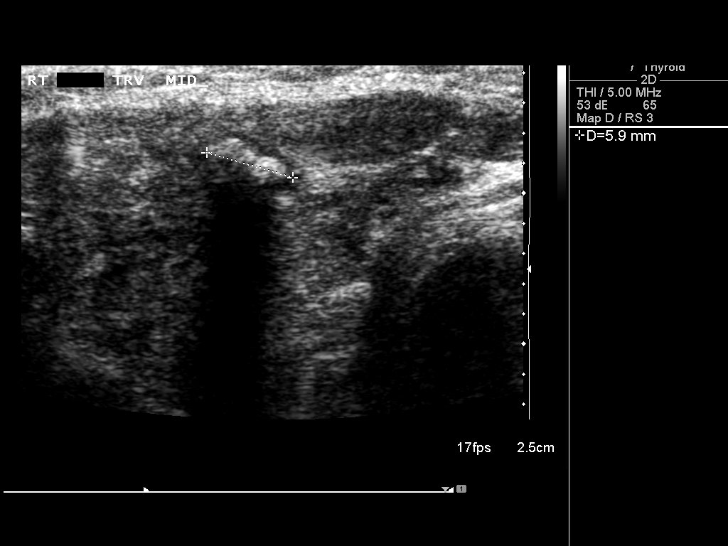
[im 14/42]
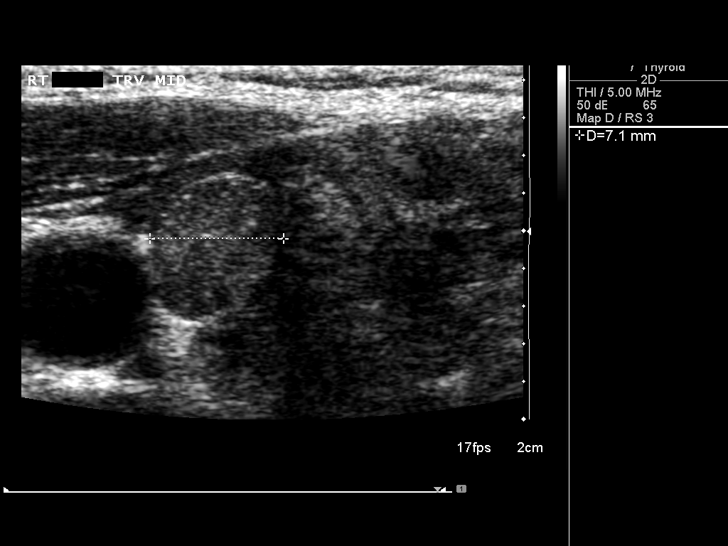
[im 18/42]
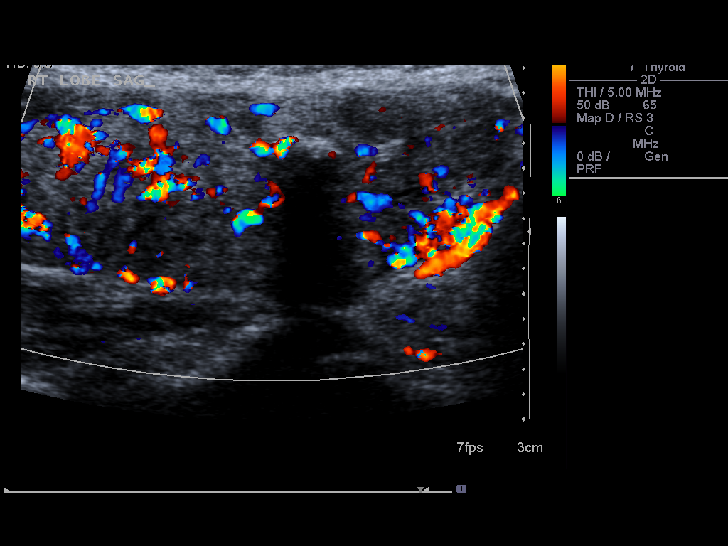
[im 21/42]
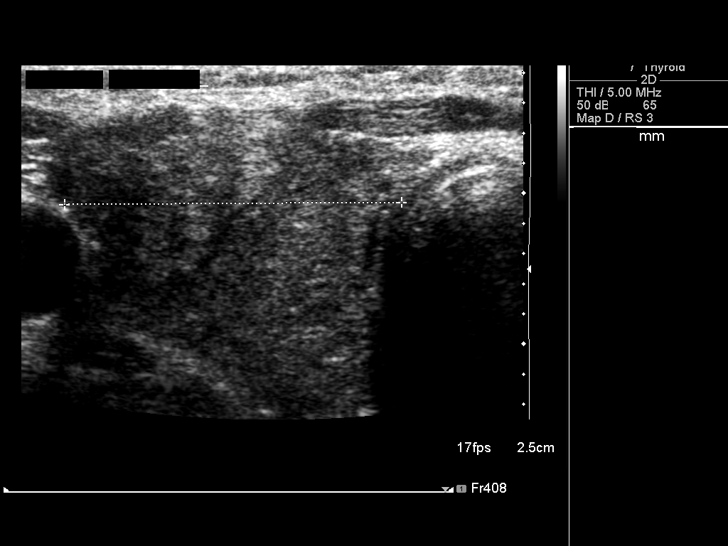
[im 24/42]
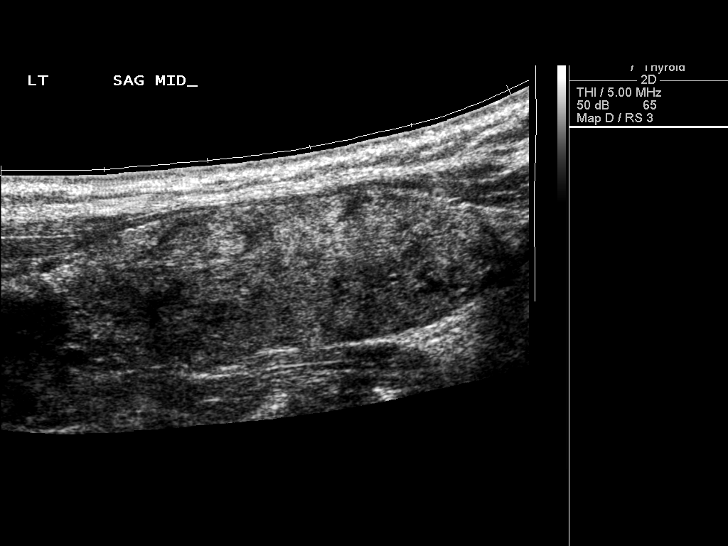
[im 28/42]
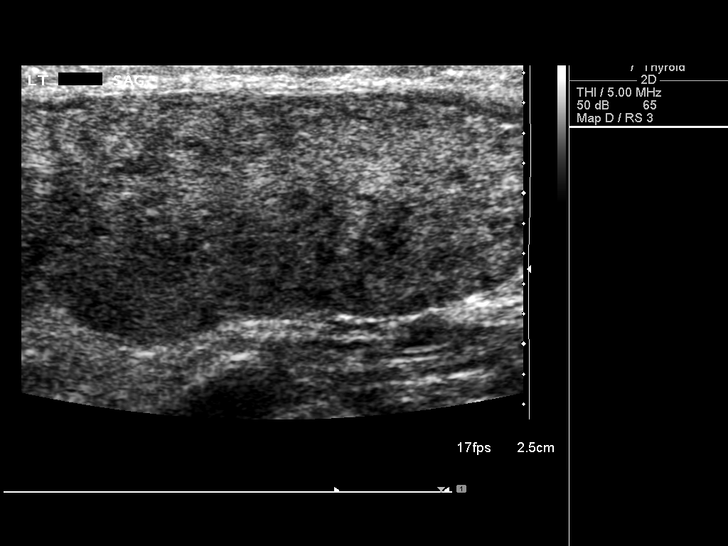
[im 31/42]
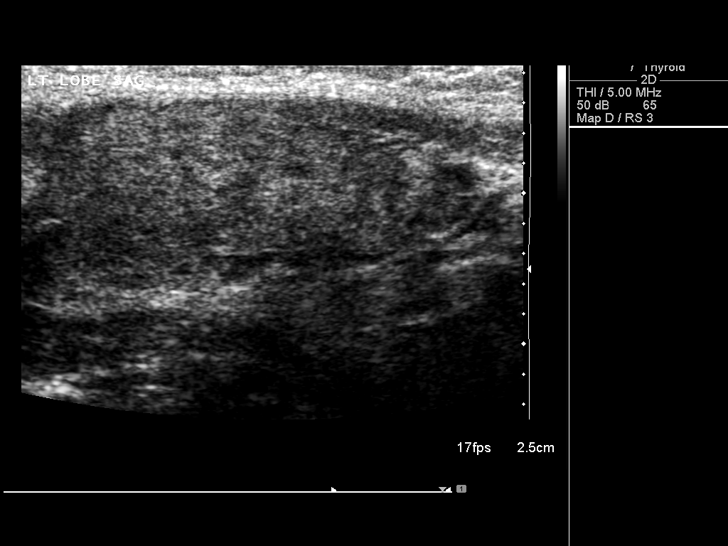
[im 35/42]
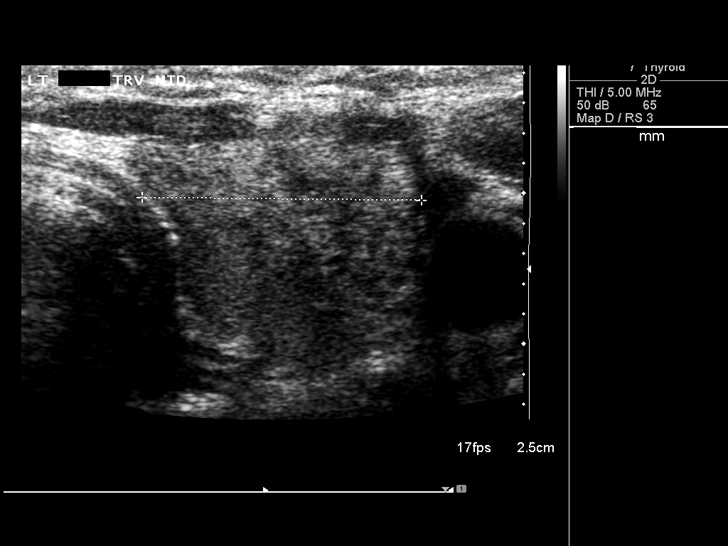
[im 38/42]
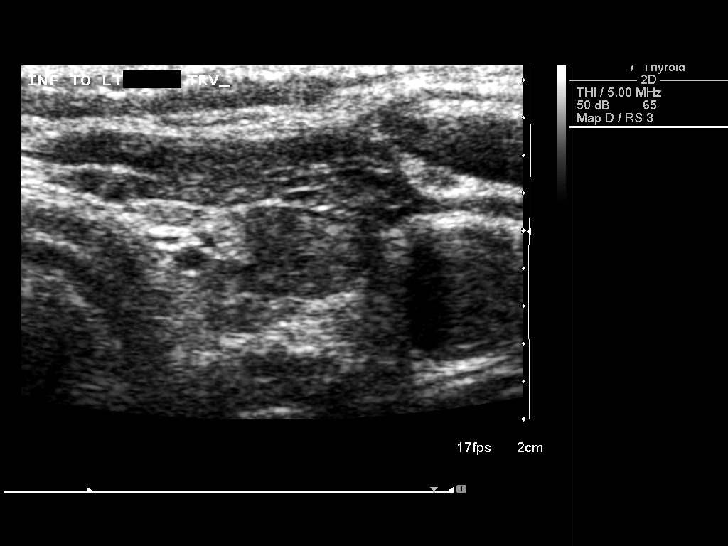
[im 42/42]
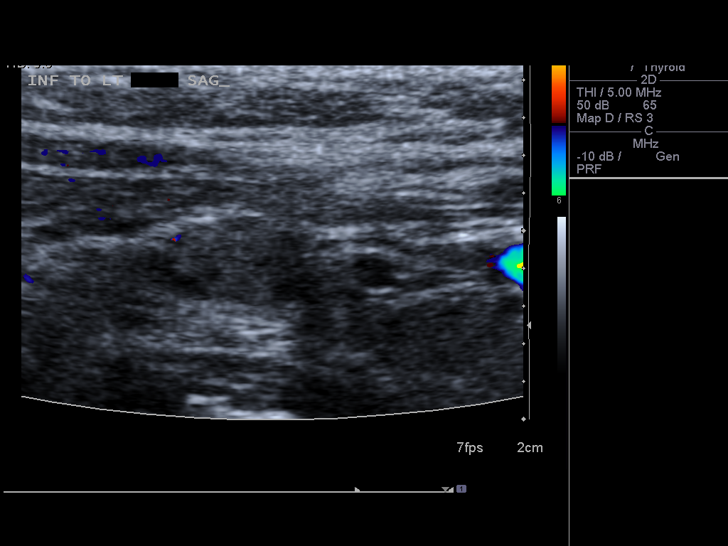

[13 of 25 positions shown; findings below may reference images not displayed]

FINDINGS: Right thyroid lobe

Measurements: 4.8 x 1.7 x 2.2 cm. (Previously 5.0 x 1.9 x 2.1 cm).
The echogenicity of the thyroid tissue remains diffusely
inhomogeneous. . A solid nodule in the periphery the right lobe
measures 1.3 x 0.7 x 0.7 cm, previously measuring 8 mm in maximum
diameter. This nodule was not measured previously, not being as
distinct, and it may have increased in size in the interval. Either
close followup ultrasound or biopsy may be warranted in view of the
possible interval growth. A focus of calcification is noted in the
mid right lobe of 7 mm in diameter.

Left thyroid lobe

Measurements: 4.5 x 1.5 x 1.9 cm. (Previously 4.2 x 1.6 x 1.8 cm)..
No definite measurable nodule is seen on the left. The gland remains
diffusely inhomogeneous.

Isthmus

Thickness: 2 mm in thickness..  No definite nodule is seen.

However, just inferior to the left lobe there is and oval soft
tissue nodule by 1.0 x 0.6 x 0.7 cm previously measured 1.0 x 0.6 x
0.7 cm. As noted previously this could represent a lymph node or
possibly a left inferior parathyroid adenoma. Clinical correlation
is recommended.

Lymphadenopathy

None visualized.
IMPRESSION: 1. The 1.3 cm solid nodule in the periphery of the right lobe of
thyroid was not definitely visualized previously. Either close
follow-up or ultrasound -guided biopsy is recommended.
2. No change in oval soft tissue nodule inferior to the left lobe of
thyroid representing either a lymph node or left inferior
parathyroid adenoma.

## 2012-12-10 ENCOUNTER — Ambulatory Visit
Admission: RE | Admit: 2012-12-10 | Discharge: 2012-12-10 | Disposition: A | Discharge status: Home / Self Care | Payer: Federal, State, Local not specified - PPO | Source: Ambulatory Visit | Attending: Family Medicine | Admitting: Family Medicine

## 2012-12-10 ENCOUNTER — Other Ambulatory Visit (HOSPITAL_COMMUNITY)
Admission: RE | Admit: 2012-12-10 | Discharge: 2012-12-10 | Disposition: A | Discharge status: Home / Self Care | Payer: Federal, State, Local not specified - PPO | Source: Ambulatory Visit | Attending: Family Medicine | Admitting: Family Medicine

## 2012-12-10 DIAGNOSIS — E041 Nontoxic single thyroid nodule: Secondary | ICD-10-CM

## 2012-12-10 IMAGING — US US THYROID BIOPSY
1 series · 8 of 8 positions shown · non-contrast
Comparison: Thyroid Ultrasound - 11/26/2012

COMPLICATIONS:
None immediate

INDICATION: Indeterminate thyroid nodule

EXAM:
ULTRASOUND GUIDED FINE NEEDLE ASPIRATION OF INDETERMINATE THYROID
NODULE
MEDICATIONS:
None
TECHNIQUE: Informed written consent was obtained from the patient after a
discussion of the risks, benefits and alternatives to treatment.
Questions regarding the procedure were encouraged and answered. A
timeout was performed prior to the initiation of the procedure.

[Series 1: us thyroid biopsy · 0.04mm/px · 8 acquisitions, 8 frames shown]
[im 1/8]
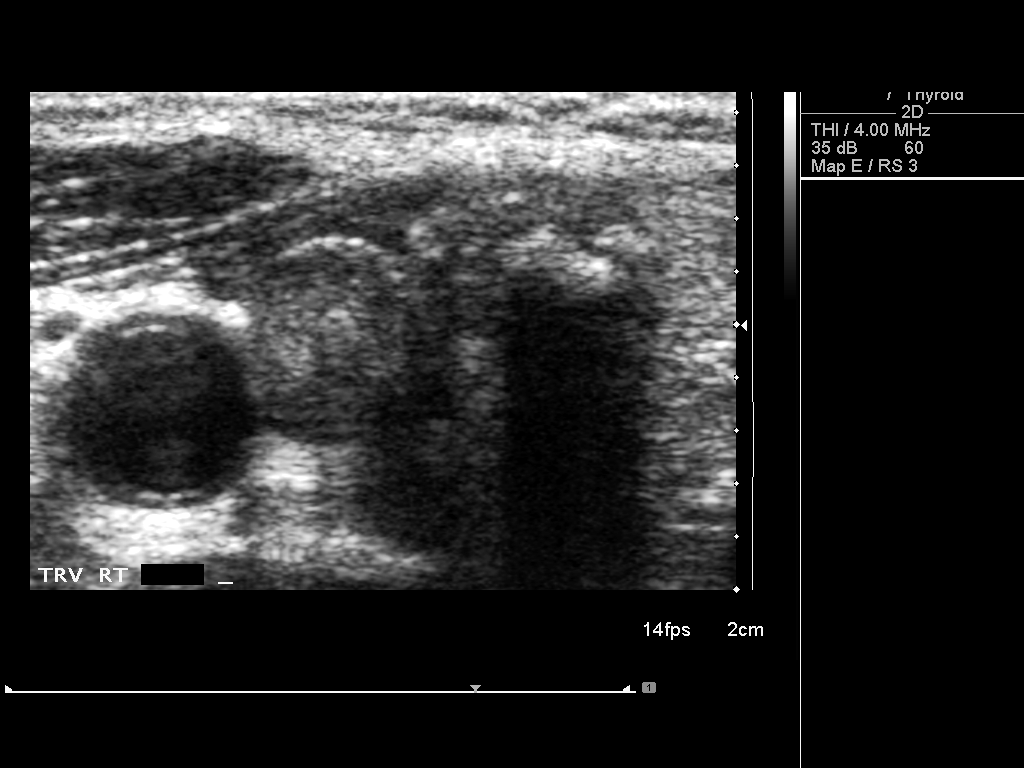
[im 2/8]
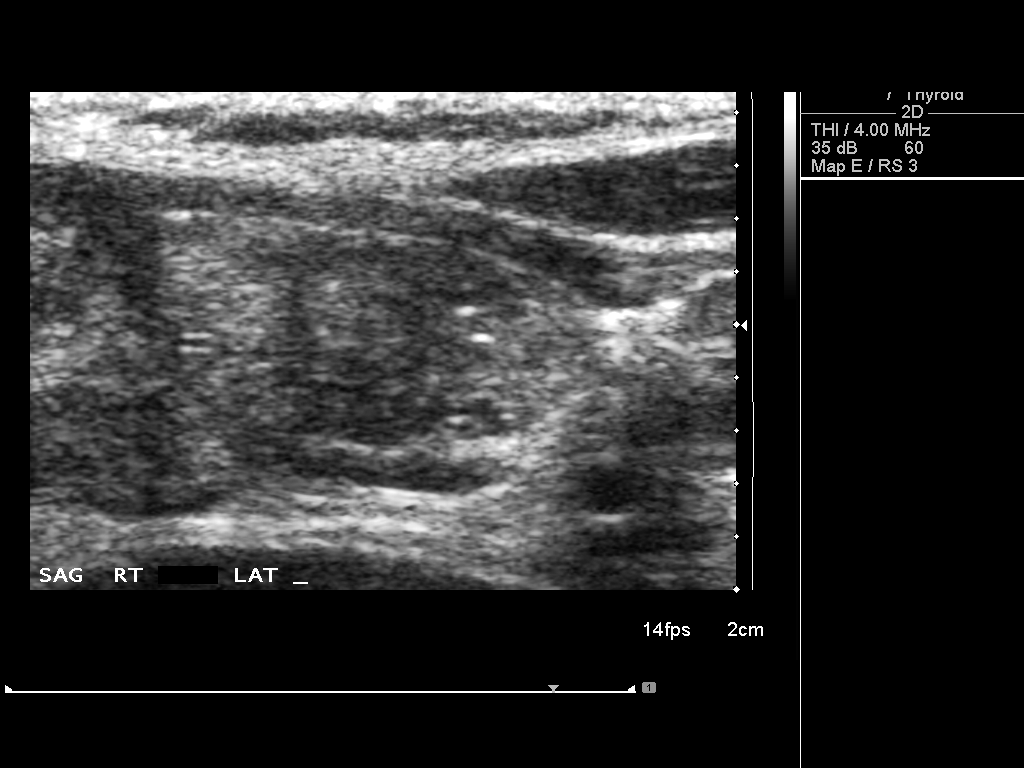
[im 3/8]
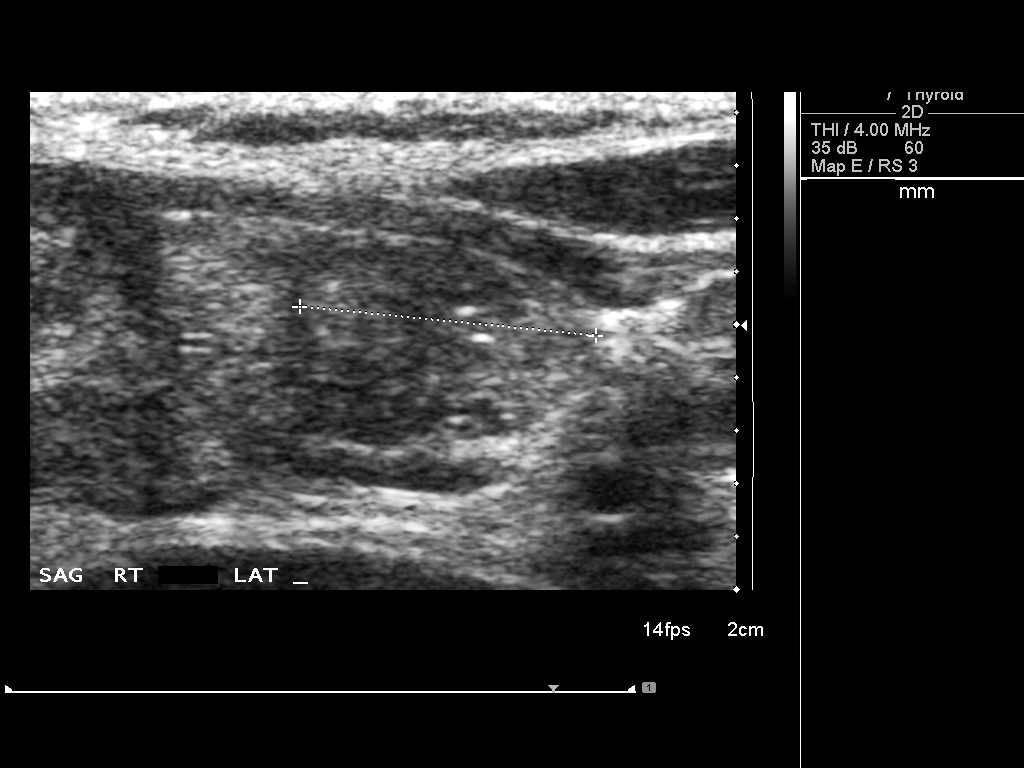
[im 4/8]
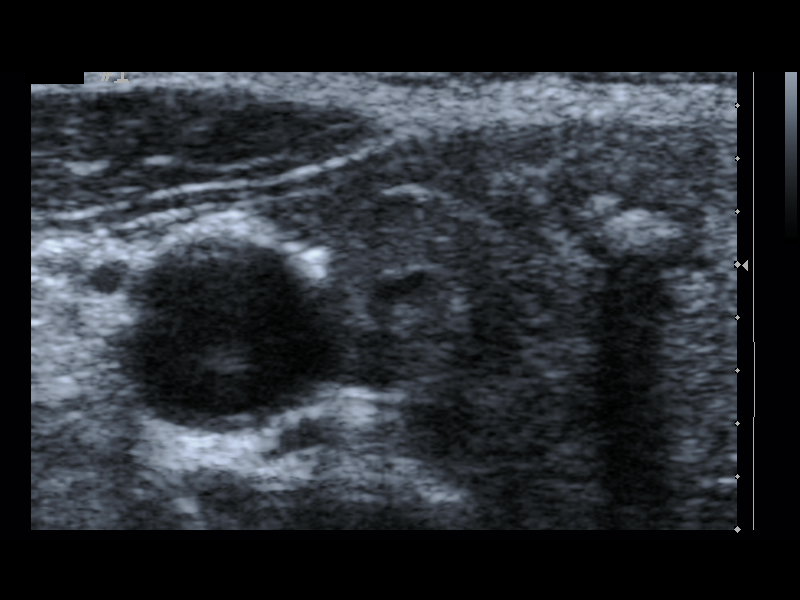
[im 5/8]
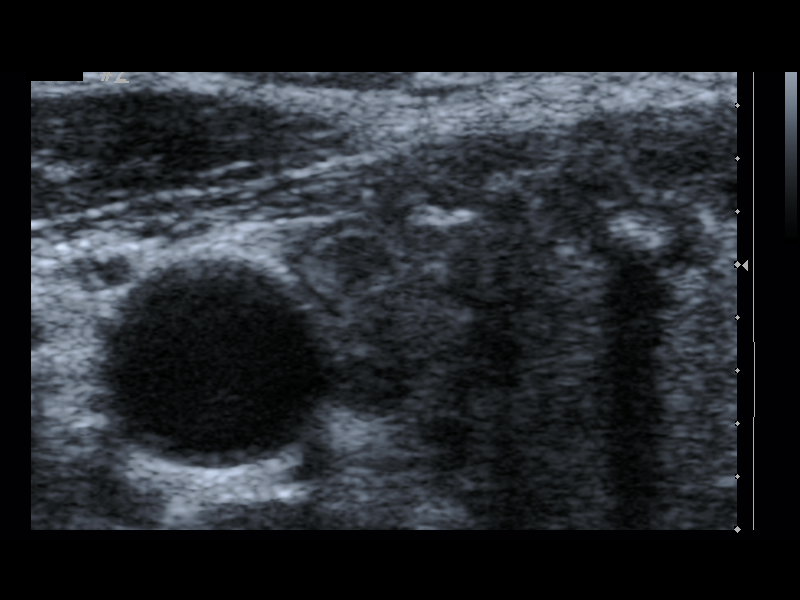
[im 6/8]
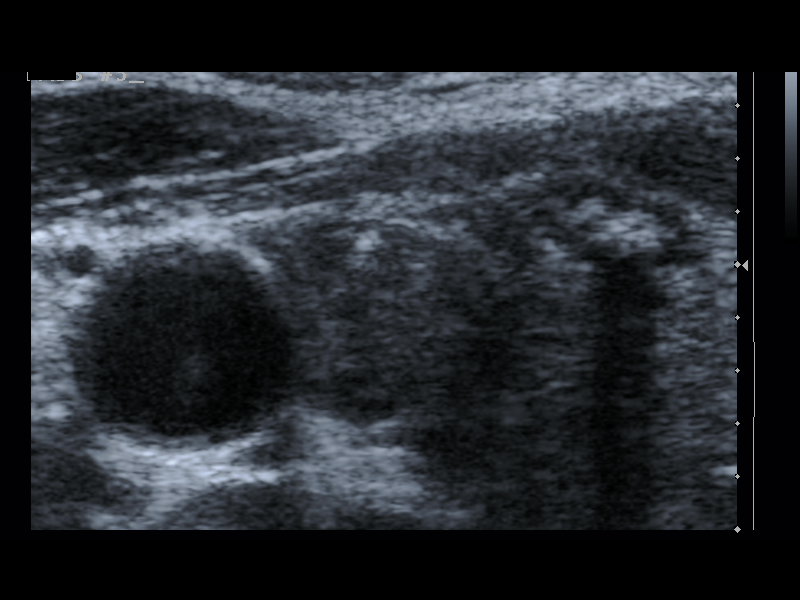
[im 7/8]
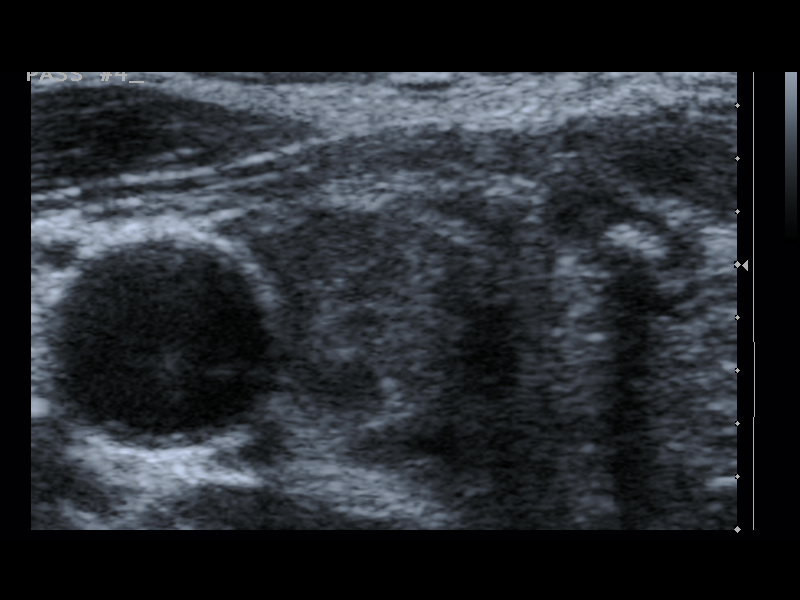
[im 8/8]
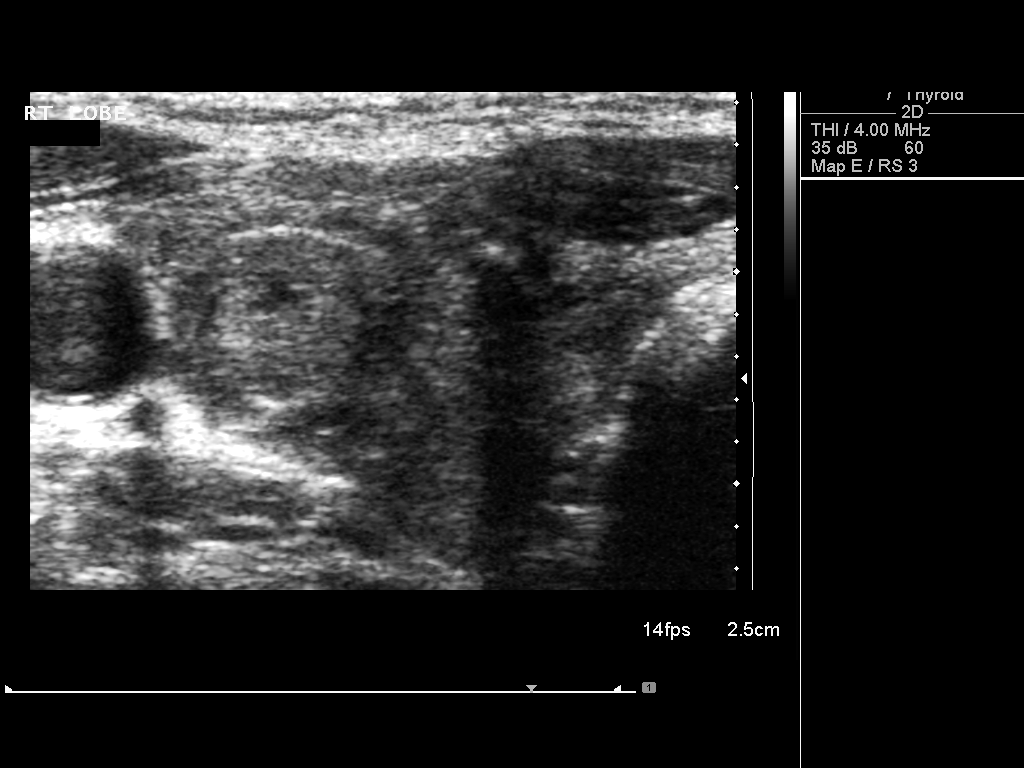

[8 of 8 positions shown; findings below may reference images not displayed]

Pre-procedural ultrasound scanning demonstrated grossly unchanged
appearance of dominant partially exophytic mixed echogenic nodule
within the mid aspect of the right lobe of the thyroid which again
demonstrates several punctate internal echogenic foci suggestive of
microcalcifications.

The procedure was planned. The neck was prepped in the usual sterile
fashion, and a sterile drape was applied covering the operative
field. A timeout was performed prior to the initiation of the
procedure. Local anesthesia was provided with 1% lidocaine.

Under direct ultrasound guidance, 4 FNA biopsies were performed of
the dominant nodule within the mid aspect of the right lobe of the
thyroid with a 25 gauge needle. The samples were prepared and
submitted to pathology.

Limited post procedural scanning was negative for hematoma or
additional complication. Dressings were placed. The patient
tolerated the above procedures procedure well without immediate
postprocedural complication.
IMPRESSION: Technically successful ultrasound guided fine needle aspiration of
dominant nodule within the mid aspect of the right lobe of the
thyroid.

## 2012-12-14 ENCOUNTER — Telehealth: Payer: Self-pay | Admitting: *Deleted

## 2012-12-14 NOTE — Telephone Encounter (Signed)
Patient called and left message. Karren Burly did give her negative thyroid biopsy results on Friday. Wanting to know if she needs to follow up here with you? Are all her medications going to remain the same? Please advise.

## 2012-12-14 NOTE — Telephone Encounter (Signed)
Left message for patient to return my call.

## 2012-12-14 NOTE — Telephone Encounter (Signed)
Yes.  Continue current medications.  TSH was right at 3. If she has increasing size of her thyroid, or more trouble with swallowing, then we can increase thyroid medication dose, but I think if we bumped up dose now, it might be too much.  She needs A1c, LFT's, Vitamin D-OH and TSH repeated prior to her next med check.  This is due in May (6 months from her last visit) and doesn't look like she has it scheduled yet.  She can come fasting, or get labs done prior to her visit if she prefers (in which case, please enter orders)

## 2012-12-16 ENCOUNTER — Telehealth: Payer: Self-pay | Admitting: *Deleted

## 2012-12-16 NOTE — Telephone Encounter (Signed)
Left message for pt to return my call.

## 2012-12-16 NOTE — Telephone Encounter (Signed)
Spoke with patient and went over Dr.Knapp's recommendations. She would prefer to come fasting to her med check in May 2015. She would not schedule and appointment with me on the phone today. She said she would have to call back at a later date. I suggested that she try to call back in the next month or so as Dr.Knapp's appointments fill up quickly.

## 2012-12-24 ENCOUNTER — Other Ambulatory Visit (INDEPENDENT_AMBULATORY_CARE_PROVIDER_SITE_OTHER): Payer: Federal, State, Local not specified - PPO

## 2012-12-24 DIAGNOSIS — E782 Mixed hyperlipidemia: Secondary | ICD-10-CM

## 2012-12-24 DIAGNOSIS — I10 Essential (primary) hypertension: Secondary | ICD-10-CM

## 2012-12-24 DIAGNOSIS — Z2911 Encounter for prophylactic immunotherapy for respiratory syncytial virus (RSV): Secondary | ICD-10-CM

## 2012-12-24 DIAGNOSIS — Z23 Encounter for immunization: Secondary | ICD-10-CM

## 2012-12-24 MED ORDER — OLMESARTAN MEDOXOMIL-HCTZ 40-25 MG PO TABS: 1.0000 | ORAL_TABLET | Freq: Every day | ORAL | Status: DC

## 2012-12-24 MED ORDER — EZETIMIBE-SIMVASTATIN 10-40 MG PO TABS: 1.0000 | ORAL_TABLET | Freq: Every day | ORAL | Status: DC

## 2012-12-28 ENCOUNTER — Telehealth: Payer: Self-pay | Admitting: Internal Medicine

## 2012-12-28 NOTE — Telephone Encounter (Signed)
Fax from [employer] [facility] for test strips, meter and lancets. Pt states she will try this [facility] to get her testing supplies

## 2013-01-07 HISTORY — PX: FOOT SURGERY: SHX648

## 2013-02-17 ENCOUNTER — Encounter: Payer: Self-pay | Admitting: Family Medicine

## 2013-02-17 ENCOUNTER — Ambulatory Visit (INDEPENDENT_AMBULATORY_CARE_PROVIDER_SITE_OTHER): Payer: Federal, State, Local not specified - PPO | Admitting: Family Medicine

## 2013-02-17 VITALS — BP 128/82 | HR 60 | Ht 66.0 in | Wt 192.0 lb

## 2013-02-17 DIAGNOSIS — M25532 Pain in left wrist: Secondary | ICD-10-CM

## 2013-02-17 DIAGNOSIS — M25539 Pain in unspecified wrist: Secondary | ICD-10-CM

## 2013-02-17 DIAGNOSIS — M779 Enthesopathy, unspecified: Secondary | ICD-10-CM

## 2013-02-17 NOTE — Patient Instructions (Addendum)
Take the Aleve that you have at home in the following way:  2 pills together, with breakfast and dinner (twice daily, with food) for at least a week; take it for up to 2 weeks.  If you aren't better by then, return for re-evaluation.  I would also like for you to wear a wrist brace on the left wrist.  You can buy this at the pharmacy (ie carpal tunnel brace). That limits the motion of the wrist, allowing it to "rest" and heal.  You can also try heat vs ice, vs topical medications such as biofreeze or icy hot.

## 2013-02-17 NOTE — Progress Notes (Signed)
Chief Complaint  Patient presents with  . Arm Pain    left arm pain, more of a discomfort. Goes from her hand to her forearm. Has been bothering her for a couple of weeks, and is worsening.   Pain starts in the back side of her left hand, in the middle portion of the hand, and radiates up above the elbow, to the mid-portion of her upper left arm.  She has had this discomfort for at least 2-3 weeks, becoming more constant.    She is on the computer a lot, but that doesn't seem to bother her that much, not worse.  She has pain with turning the steering wheel, lifting things.  She is left-handed.  She denies any change in activity, new activity or any known injury.  Denies numbness, tingling.  She has not tried anything for the pain--no ice/heat or medications.  She periodically has problems with her left shoulder, unsure if that could be related.  She denies neck pain.  She feels like there might be some weakness in the left hand, but only intermittent (and related to pain).  Past Medical History  Diagnosis Date  . Hypertension   . Diabetes mellitus   . Hyperlipidemia   . GERD (gastroesophageal reflux disease)   . Premature menopause age 56  . Constipation   . Vitamin D deficiency 2009  . Thyroid disease     hypothyroidism   Past Surgical History  Procedure Laterality Date  . Esophagogastroduodenoscopy  [id number]  . Colonoscopy  [id number]  . Cesarean section      x 2  . Tubal ligation     History   Social History  . Marital Status: Widowed    Spouse Name: N/A    Number of Children: N/A  . Years of Education: N/A   Occupational History  . Retired from [employer]  . Smoking status: Never Smoker   . Smokeless tobacco: Never Used  . Alcohol Use: Yes     Comment: socially maybe 1-2 times monthly  . Drug Use: No  . Sexual Activity: Not Currently   Other Topics Concern  . Not on file   Social History Narrative   Lives alone.  Daughter lives in [place] and  other daughter lives in [place], [place]    Outpatient Encounter Prescriptions as of 02/17/2013  Medication Sig  . amLODipine (NORVASC) 10 MG tablet Take 1 tablet (10 mg total) by mouth daily.  Marland Kitchen aspirin 81 MG tablet Take 81 mg by mouth daily.    . cholecalciferol (VITAMIN D) 1000 UNITS tablet Take 2,000 Units by mouth daily.  Marland Kitchen ezetimibe-simvastatin (VYTORIN) 10-40 MG per tablet Take 1 tablet by mouth at bedtime.  Marland Kitchen levothyroxine (SYNTHROID, LEVOTHROID) 50 MCG tablet take 1 tablet by mouth once daily  . metFORMIN (GLUCOPHAGE) 1000 MG tablet take 1 tablet by mouth twice a day with food  . olmesartan-hydrochlorothiazide (BENICAR HCT) 40-25 MG per tablet Take 1 tablet by mouth daily.  Marland Kitchen omeprazole (PRILOSEC) 20 MG capsule take 1 capsule by mouth once daily  . saxagliptin HCl (ONGLYZA) 5 MG TABS tablet Take 1 tablet (5 mg total) by mouth daily.  . magnesium hydroxide (MILK OF MAGNESIA) 400 MG/5ML suspension Take 5 mLs by mouth daily as needed.  . Multiple Vitamins-Minerals (CENTRUM SILVER PO) Take 1 tablet by mouth daily.     No Known Allergies  ROS:  Denies neck pain, headache, numbness, tingling.  Denies fevers, URI symptoms, chest  pain, palpitations, bleeding, bruising or any GI complaints.  See HPI.  PHYSICAL EXAM: BP 128/82  Pulse 60  Ht 5\' 6"  (1.676 m)  Wt 192 lb (87.091 kg)  BMI 31.00 kg/m2  Area of pain is dorsal left wrist.  No erythema, warmth.  FROM.  nontender to palpation.  She has pain with flexion and extension, but much worse with pronation and supination.  She has pain at the lateral elbow with these motions too. She is tender over lateral epicondyle and the muscle belly in lateral forearm.  Normal strength, sensation.  Negative Tinel sign.  R foot in boot (surgery 1/8 for bone spur)  ASSESSMENT/PLAN:  Left wrist pain - suspect that it is due to tendonitis.  no e/o CTS.  NSAID and wrist brace.  f/u 2 wks if not better  Tendonitis  NSAID precautions reviewed.  She has  Aleve at home--to take 2 BID for 7-14 days.  To wear wrist brace, and discussed that this prevents flexion/extension, but not pronation/supination.  Try and rest, and use the brace as a mental reminder to try and use RUE more than left.  F/u in 2 weeks if symptoms persist/worsen.

## 2013-03-10 ENCOUNTER — Telehealth: Payer: Self-pay | Admitting: Family Medicine

## 2013-03-10 ENCOUNTER — Other Ambulatory Visit: Payer: Self-pay | Admitting: *Deleted

## 2013-03-10 MED ORDER — LEVOTHYROXINE SODIUM 50 MCG PO TABS: ORAL_TABLET | ORAL | Status: DC

## 2013-03-10 NOTE — Telephone Encounter (Signed)
Done

## 2013-03-11 NOTE — Telephone Encounter (Signed)
Done

## 2013-04-16 ENCOUNTER — Emergency Department (HOSPITAL_COMMUNITY)
Admission: EM | Admit: 2013-04-16 | Discharge: 2013-04-16 | Disposition: A | Discharge status: Home / Self Care | Payer: Federal, State, Local not specified - PPO | Source: Home / Self Care | Attending: Family Medicine | Admitting: Family Medicine

## 2013-04-16 ENCOUNTER — Encounter (HOSPITAL_COMMUNITY): Payer: Self-pay | Admitting: Emergency Medicine

## 2013-04-16 DIAGNOSIS — H6120 Impacted cerumen, unspecified ear: Secondary | ICD-10-CM

## 2013-04-16 MED ORDER — NEOMYCIN-POLYMYXIN-HC 1 % OT SOLN
4.0000 [drp] | Freq: Four times a day (QID) | OTIC | Status: DC
  Administered 2013-04-16: 4 [drp] via OTIC

## 2013-04-16 NOTE — ED Provider Notes (Signed)
CSN: [id number]     Arrival date & time 04/16/13  2458 History   First MD Initiated Contact with Patient 04/16/13 1039     Chief Complaint  Patient presents with  . Otalgia   (Consider location/radiation/quality/duration/timing/severity/associated sxs/prior Treatment) Patient is a 63 y.o. female presenting with ear pain. The history is provided by the patient.  Otalgia Location:  Left Behind ear:  No abnormality Quality:  Throbbing and pressure Severity:  Moderate Onset quality:  Sudden Duration:  1 day Timing:  Constant Progression:  Unchanged Chronicity:  New Relieved by:  Nothing Worsened by:  Nothing tried Ineffective treatments:  None tried Associated symptoms: no ear discharge, no fever, no rhinorrhea and no sore throat     Past Medical History  Diagnosis Date  . Hypertension   . Diabetes mellitus   . Hyperlipidemia   . GERD (gastroesophageal reflux disease)   . Premature menopause age 24  . Constipation   . Vitamin D deficiency 2009  . Thyroid disease     hypothyroidism   Past Surgical History  Procedure Laterality Date  . Esophagogastroduodenoscopy  [id number]  . Colonoscopy  [id number]  . Cesarean section      x 2  . Tubal ligation    . Foot surgery Right 01/2013    bone spur   Family History  Problem Relation Age of Onset  . Hypertension Mother   . Kidney disease Mother     renal failure, dialysis  . Hypertension Father   . Stroke Father   . Arthritis Maternal Aunt   . Heart disease Maternal Aunt   . Diabetes Maternal Grandmother   . Cancer Neg Hx   . Breast cancer Neg Hx   . Colon cancer Neg Hx    History  Substance Use Topics  . Smoking status: Never Smoker   . Smokeless tobacco: Never Used  . Alcohol Use: Yes     Comment: socially maybe 1-2 times monthly   OB History   Grav Para Term Preterm Abortions TAB SAB Ect Mult Living   2 2        2      Review of Systems  Constitutional: Negative for fever.  HENT: Positive for ear pain.  Negative for ear discharge, rhinorrhea and sore throat.     Allergies  Review of patient's allergies indicates no known allergies.  Home Medications   Current Outpatient Rx  Name  Route  Sig  Dispense  Refill  . amLODipine (NORVASC) 10 MG tablet   Oral   Take 1 tablet (10 mg total) by mouth daily.   30 tablet   5   . aspirin 81 MG tablet   Oral   Take 81 mg by mouth daily.           Marland Kitchen ezetimibe-simvastatin (VYTORIN) 10-40 MG per tablet   Oral   Take 1 tablet by mouth at bedtime.   28 tablet   0   . levothyroxine (SYNTHROID, LEVOTHROID) 50 MCG tablet      take 1 tablet by mouth once daily   30 tablet   1   . metFORMIN (GLUCOPHAGE) 1000 MG tablet      take 1 tablet by mouth twice a day with food   60 tablet   5   . olmesartan-hydrochlorothiazide (BENICAR HCT) 40-25 MG per tablet   Oral   Take 1 tablet by mouth daily.   28 tablet   0   . omeprazole (PRILOSEC) 20 MG capsule  take 1 capsule by mouth once daily   30 capsule   11   . cholecalciferol (VITAMIN D) 1000 UNITS tablet   Oral   Take 2,000 Units by mouth daily.         . magnesium hydroxide (MILK OF MAGNESIA) 400 MG/5ML suspension   Oral   Take 5 mLs by mouth daily as needed.         . Multiple Vitamins-Minerals (CENTRUM SILVER PO)   Oral   Take 1 tablet by mouth daily.           . saxagliptin HCl (ONGLYZA) 5 MG TABS tablet   Oral   Take 1 tablet (5 mg total) by mouth daily.   30 tablet   5    BP 164/82  Pulse 66  Temp(Src) 97.8 F (36.6 C) (Oral)  Resp 16  SpO2 98% Physical Exam  Nursing note and vitals reviewed. Constitutional: She is oriented to person, place, and time. She appears well-nourished. She appears distressed.  HENT:  Right Ear: Hearing normal.  Left Ear: There is tenderness. Decreased hearing is noted.  Ears:  Mouth/Throat: Oropharynx is clear and moist.  Eyes: Conjunctivae are normal. Pupils are equal, round, and reactive to light.  Neck: Normal range  of motion. Neck supple.  Neurological: She is alert and oriented to person, place, and time.  Skin: Skin is warm and dry.    ED Course  Procedures (including critical care time) Labs Review Labs Reviewed - No data to display Imaging Review No results found.   MDM   1. Cerumen impaction    Sx improved, canal and tm nl after irrig.    Billy Fischer, MD 04/16/13 1302

## 2013-04-16 NOTE — ED Notes (Signed)
Pt c/o left ear pain onset last night Denies drainage, f/v/n/d, cold sxs Reports she is taking left over Amox Alert w/no signs of acute distress.

## 2013-04-22 ENCOUNTER — Telehealth: Payer: Self-pay | Admitting: Family Medicine

## 2013-04-22 DIAGNOSIS — E782 Mixed hyperlipidemia: Secondary | ICD-10-CM

## 2013-04-22 DIAGNOSIS — I10 Essential (primary) hypertension: Secondary | ICD-10-CM

## 2013-04-22 MED ORDER — OLMESARTAN MEDOXOMIL-HCTZ 40-25 MG PO TABS: 1.0000 | ORAL_TABLET | Freq: Every day | ORAL | Status: DC

## 2013-04-22 MED ORDER — EZETIMIBE-SIMVASTATIN 10-40 MG PO TABS: 1.0000 | ORAL_TABLET | Freq: Every day | ORAL | Status: DC

## 2013-04-22 NOTE — Telephone Encounter (Signed)
Patient notified that samples are up front.

## 2013-05-12 ENCOUNTER — Other Ambulatory Visit: Payer: Self-pay | Admitting: Family Medicine

## 2013-06-03 ENCOUNTER — Ambulatory Visit (INDEPENDENT_AMBULATORY_CARE_PROVIDER_SITE_OTHER): Payer: Federal, State, Local not specified - PPO | Admitting: Family Medicine

## 2013-06-03 ENCOUNTER — Encounter: Payer: Self-pay | Admitting: Family Medicine

## 2013-06-03 VITALS — BP 134/78 | HR 60 | Ht 66.0 in | Wt 191.0 lb

## 2013-06-03 DIAGNOSIS — Z79899 Other long term (current) drug therapy: Secondary | ICD-10-CM

## 2013-06-03 DIAGNOSIS — M25519 Pain in unspecified shoulder: Secondary | ICD-10-CM

## 2013-06-03 DIAGNOSIS — E039 Hypothyroidism, unspecified: Secondary | ICD-10-CM

## 2013-06-03 DIAGNOSIS — I10 Essential (primary) hypertension: Secondary | ICD-10-CM

## 2013-06-03 DIAGNOSIS — E119 Type 2 diabetes mellitus without complications: Secondary | ICD-10-CM

## 2013-06-03 DIAGNOSIS — M25512 Pain in left shoulder: Secondary | ICD-10-CM

## 2013-06-03 DIAGNOSIS — E559 Vitamin D deficiency, unspecified: Secondary | ICD-10-CM

## 2013-06-03 LAB — HEPATIC FUNCTION PANEL
ALK PHOS: 48 U/L (ref 39–117)
ALT: 10 U/L (ref 0–35)
AST: 17 U/L (ref 0–37)
Albumin: 4.4 g/dL (ref 3.5–5.2)
Bilirubin, Direct: 0.1 mg/dL (ref 0.0–0.3)
Indirect Bilirubin: 0.2 mg/dL (ref 0.2–1.2)
TOTAL PROTEIN: 6.9 g/dL (ref 6.0–8.3)
Total Bilirubin: 0.3 mg/dL (ref 0.2–1.2)

## 2013-06-03 LAB — POCT GLYCOSYLATED HEMOGLOBIN (HGB A1C): HEMOGLOBIN A1C: 7.1

## 2013-06-03 MED ORDER — AMLODIPINE BESYLATE 10 MG PO TABS: 10.0000 mg | ORAL_TABLET | Freq: Every day | ORAL | Status: DC

## 2013-06-03 MED ORDER — SAXAGLIPTIN HCL 5 MG PO TABS: 5.0000 mg | ORAL_TABLET | Freq: Every day | ORAL | Status: DC

## 2013-06-03 MED ORDER — METFORMIN HCL 1000 MG PO TABS: ORAL_TABLET | ORAL | Status: DC

## 2013-06-03 MED ORDER — LEVOTHYROXINE SODIUM 50 MCG PO TABS: ORAL_TABLET | ORAL | Status: DC

## 2013-06-03 NOTE — Patient Instructions (Signed)
Go to [facility] to get x-ray of your shoulder Use Tylenol Arthritis at bedtime for pain.  If this isn't helpful, use aleve (but take with food). Consider Physical Therapy if ongoing shoulder pain   Your sugars have been running a little higher.  Please watch your diet carefully, try not to eat too much fruit (as well as sugar/sweets/carbs).  If your A1c remains >7 then we will need to adjust your medications.

## 2013-06-03 NOTE — Progress Notes (Signed)
Chief Complaint  Patient presents with  . medication check   Patient presents to follow up on her chronic medical issues.  L wrist and shoulder pain--she tried the wrist brace and Aleve, and it helped.  She only took the aleve once daily because it made her sleepy, took it in the evening.  The pain seems to have gotten a little worse having more pain daily, rather than intermittently.  Mostly bothers her at night.  The pain goes up and down her arm, from shoulder to wrist.  She wears the wrist brace when she is on the computer, but not sleeping at night.  She notices some clicking in the left wrist, but it isn't painful. (when rotating her wrist in a circle).  Diabetes follow-up: Blood sugars at home are running up to 124 in the mornings, 120-130 in the evenings. Denies hypoglycemia. Denies polydipsia and polyuria. Last eye exam was 01/2013. Patient follows a low sugar diet and checks feet regularly without concerns. Compliant with her medications, and denies side effects.  She has been trying to eat more fruit, denies any other changes in diet.  She only eats 2x/day (9:30 am, and 4pm).  She walks, but not going to the track to walk her mile. She thinks she walks about 20-30 mins daily; denies chest pain, shortness of breath.  Hyperlipidemia follow-up: Patient is reportedly following a low-fat, low cholesterol diet. Compliant with medications and denies medication side effects   Hypertension follow-up: Blood pressures are occasionally checked at [facility], usually runs 132/80. Denies dizziness, headaches, chest pain, edema. Denies side effects of medications.   Hypothyroidism: Compliant with taking medication, takes by itself first thing in the morning.  Hair loss is unchanged--no longer losing, but it is taking longer to grow back than she would like. Feeling a little more tired than usual, but no different than per her last visit 6 months ago.  She attributes that to being busy all the time. Denies  changes in bowels (has some chronic constipation), or any skin or mood changes   Vitamin D deficiency--compliant with taking 2000 IU daily .  GERD: Well controlled with daily omeprazole. She can tell if she misses a day (mild recurrent symptoms, heaviness in chest). Denies dysphagia.   Past Medical History  Diagnosis Date  . Hypertension   . Diabetes mellitus   . Hyperlipidemia   . GERD (gastroesophageal reflux disease)   . Premature menopause age 53  . Constipation   . Vitamin D deficiency 2009  . Thyroid disease     hypothyroidism   Past Surgical History  Procedure Laterality Date  . Esophagogastroduodenoscopy  [id number]  . Colonoscopy  [id number]  . Cesarean section      x 2  . Tubal ligation    . Foot surgery Right 01/2013    bone spur   History   Social History  . Marital Status: Widowed    Spouse Name: N/A    Number of Children: N/A  . Years of Education: N/A   Occupational History  . Retired from [employer]  . Smoking status: Never Smoker   . Smokeless tobacco: Never Used  . Alcohol Use: Yes     Comment: socially maybe 1-2 times monthly  . Drug Use: No  . Sexual Activity: Not Currently   Other Topics Concern  . Not on file   Social History Narrative   Lives alone.  Daughter lives in [place] and other daughter lives  in [place], [place]    Outpatient Encounter Prescriptions as of 06/03/2013  Medication Sig  . amLODipine (NORVASC) 10 MG tablet Take 1 tablet (10 mg total) by mouth daily.  Marland Kitchen aspirin 81 MG tablet Take 81 mg by mouth daily.    . cholecalciferol (VITAMIN D) 1000 UNITS tablet Take 2,000 Units by mouth daily.  Marland Kitchen ezetimibe-simvastatin (VYTORIN) 10-40 MG per tablet Take 1 tablet by mouth at bedtime.  Marland Kitchen levothyroxine (SYNTHROID, LEVOTHROID) 50 MCG tablet take 1 tablet by mouth once daily  . magnesium hydroxide (MILK OF MAGNESIA) 400 MG/5ML suspension Take 5 mLs by mouth daily as needed.  . metFORMIN (GLUCOPHAGE) 1000 MG tablet take 1  tablet by mouth twice a day with food  . Multiple Vitamins-Minerals (CENTRUM SILVER PO) Take 1 tablet by mouth daily.    Marland Kitchen olmesartan-hydrochlorothiazide (BENICAR HCT) 40-25 MG per tablet Take 1 tablet by mouth daily.  Marland Kitchen omeprazole (PRILOSEC) 20 MG capsule take 1 capsule by mouth once daily  . saxagliptin HCl (ONGLYZA) 5 MG TABS tablet Take 1 tablet (5 mg total) by mouth daily.   ROS:  Denies fevers, chills, URI symptoms, headache, sore throat, shortness of breath, chest pain, GI complaints.  This morning she felt exceptionally tired, and somewhat "off" re: balance.  "super tired".  She feels better now.  Just had a hard time "getting it together" this morning. She felt better after she ate. She denies any dysphagia or globus sensation.  PHYSICAL EXAM: BP 152/80  Pulse 60  Ht _0  (1.676 m)  Wt 191 lb (86.637 kg)  BMI 30.84 kg/m2 134/78 on repeat by MD, large cuff, right arm Well developed, pleasant female in no distress.  She became more irritable at end of visit (when presenting handicap DMV form to be filled out as I was leaving room at end of visit. HEENT:  PERRL, EOMI, conjunctiva clear.  OP clear Neck: Borderline thyroid enlargement, no nodules palpable (she had u/s and biopsy c/w goiter, benign, in November/December) Heart: regular rate and rhythm Lungs: clear bilaterally Extremities: no edema, 2+ pulses.  She has FROM of shoulders, elbows, wrists. Some crepitus heard at left wrist.  FROM, without effusion, warmth, swelling.  Strength is 5/5 and there is no pain with any strength testing, or with palpation of muscles or elbow (very different from last exam, where she was diffusely tender over forearm, lateral epicondyle, and dorsum of left wrist, all nontender now). Skin: no rashes, bruising  Lab Results  Component Value Date   HGBA1C 7.1 06/03/2013   ASSESSMENT/PLAN:  Type II or unspecified type diabetes mellitus without mention of complication, not stated as uncontrolled -  significant increase in A1c, now over 7. Reviewed diet in detail (cut back on fruit, continue to limit sweets/sugar/carbs).  recheck in 3 mos. adjust meds if >7 - Plan: HgB A1c, saxagliptin HCl (ONGLYZA) 5 MG TABS tablet, metFORMIN (GLUCOPHAGE) 1000 MG tablet  Essential hypertension, benign - controlled - Plan: amLODipine (NORVASC) 10 MG tablet  Unspecified hypothyroidism - euthyroid by history.  mild goiter, confirmed benign biopsy - Plan: levothyroxine (SYNTHROID, LEVOTHROID) 50 MCG tablet  Left shoulder pain - normal exam.  check x-ray.  tylenol arthritis at bedtime (vs aleve prn).  wrist pain improved - Plan: DG Shoulder Left  Encounter for long-term (current) use of other medications - Plan: Hepatic function panel  Unspecified vitamin D deficiency - Plan: Vit D  25 hydroxy (rtn osteoporosis monitoring)  Goiter--stable, asymptomatic  L shoulder pain--check x-ray Use tylenol arthritis  vs aleve at bedtime (take Aleve with food) Consider PT if ongoing pain.  Vit-D, LFT today  CPE/med check 11/2013 3 month f/u DM, nonfasting--just A1c (vs TSH if symptoms)   At end of visit, asked for handicapped placard due to "neurologic condition" for which she has some disability--"I don't walk normally" She doesn't need to stop and rest, or use assistive device.  Reviewed criteria on form with her--just because she has a "neurologic problem" that affects her gait doesn't automatically mean she qualifies for handicapped placard. We reviewed neurologic d/o that WOULD (ie MS, neuropathy with weakness, etc).  I did not feel she met criteria.  She then asked for samples of Vytorin and Benicar-HCT.  Nurse Andria Frames checked availability to accommodate pt.

## 2013-06-04 LAB — VITAMIN D 25 HYDROXY (VIT D DEFICIENCY, FRACTURES): Vit D, 25-Hydroxy: 50 ng/mL (ref 30–89)

## 2013-07-22 ENCOUNTER — Telehealth: Payer: Self-pay

## 2013-07-22 NOTE — Telephone Encounter (Signed)
Called patient and let her know that Dr.Knapp did order an XRAY of the left shoulder, NOT an MRI at her 06/03/13 OV. I did let her know that she can go into [facility] at any time to have this Xray done.

## 2013-07-22 NOTE — Telephone Encounter (Signed)
Pt states you were supposed to be sending paper work to [facility] imaging so she can have a MRI done on her shoulder and she would like to know if this has been done so she can go ahead and have the MRI. Please call pt.

## 2013-08-16 ENCOUNTER — Telehealth: Payer: Self-pay | Admitting: Internal Medicine

## 2013-08-16 DIAGNOSIS — I10 Essential (primary) hypertension: Secondary | ICD-10-CM

## 2013-08-16 DIAGNOSIS — E782 Mixed hyperlipidemia: Secondary | ICD-10-CM

## 2013-08-16 MED ORDER — OLMESARTAN MEDOXOMIL-HCTZ 40-25 MG PO TABS: 1.0000 | ORAL_TABLET | Freq: Every day | ORAL | Status: DC

## 2013-08-16 MED ORDER — EZETIMIBE-SIMVASTATIN 10-40 MG PO TABS: 1.0000 | ORAL_TABLET | Freq: Every day | ORAL | Status: DC

## 2013-08-16 NOTE — Telephone Encounter (Signed)
Pt wants samples of benicar and vytorin.

## 2013-09-16 ENCOUNTER — Encounter: Payer: Self-pay | Admitting: Family Medicine

## 2013-09-16 ENCOUNTER — Ambulatory Visit (INDEPENDENT_AMBULATORY_CARE_PROVIDER_SITE_OTHER): Payer: Federal, State, Local not specified - PPO | Admitting: Family Medicine

## 2013-09-16 VITALS — BP 130/82 | HR 68 | Temp 98.2°F | Ht 66.0 in | Wt 195.0 lb

## 2013-09-16 DIAGNOSIS — J069 Acute upper respiratory infection, unspecified: Secondary | ICD-10-CM

## 2013-09-16 NOTE — Patient Instructions (Signed)
  Drink plenty of fluids. You can continue your current over-the-counter medications, OR change to Coridicin and Mucinex (guaifenesin). The coridicin will help dry the nasal drainage.  The mucinex keeps the mucus thin. You can try the DM version of Mucinex (contains dextromethorphan, which is a cough suppressant) or use a separate delsym syrup, as needed for cough (mainly at bedtime).  Use saline spray in the nose, and consider trying sinus rinses (sinus rinse kit or neti-pot). You may use afrin spray at bedtime--use only for 2-3 days, and not all day long.  If used regularly, you run the risk of rebound swelling, in which case you will feel worse in the long-run.  Please read all the ingredients of the over-the-counter medications to ensure that ingredients aren't being duplicated (which is very common in combination cold medications). Avoid decongestants due to them increasing blood pressure (usually say "sinus" in the labels).  Call next week if your mucus becomes discolored, you develop sinus pain, or with other concerns.

## 2013-09-16 NOTE — Progress Notes (Signed)
Chief Complaint  Patient presents with  . cold    bad cold, cough chest congestion going on since friday    6 days ago she started with sore throat, and then progressed to runny nose, congestion, cough.  She denies headaches or sinus pain.  She can't blow anything from her nose, nose is just stuffy. She is having postnasal drainage. She isn't able to cough up any phlegm.  She had a low grade fever a few days ago.  Slight achiness of her body.  Denies shortness of breath.  She has taken Robitussin, Alka Selzer plus, drinking hot tea with lemon.  She has had some improvement from these measures.  Symptoms aren't worsening, but haven't started to improve yet.  Her main concern is that she can't smell or taste.  She can't breathe through her nose if she lies on her right side at night. She isn't able to breathe through her nose.  Past Medical History  Diagnosis Date  . Hypertension   . Diabetes mellitus   . Hyperlipidemia   . GERD (gastroesophageal reflux disease)   . Premature menopause age 27  . Constipation   . Vitamin D deficiency 2009  . Thyroid disease     hypothyroidism   Past Surgical History  Procedure Laterality Date  . Esophagogastroduodenoscopy  [id number]  . Colonoscopy  [id number]  . Cesarean section      x 2  . Tubal ligation    . Foot surgery Right 01/2013    bone spur   History   Social History  . Marital Status: Widowed    Spouse Name: N/A    Number of Children: N/A  . Years of Education: N/A   Occupational History  . Retired from [employer]  . Smoking status: Never Smoker   . Smokeless tobacco: Never Used  . Alcohol Use: Yes     Comment: socially maybe 1-2 times monthly  . Drug Use: No  . Sexual Activity: Not Currently   Other Topics Concern  . Not on file   Social History Narrative   Lives alone.  Daughter lives in [place] and other daughter lives in [place], [place]   Outpatient Encounter Prescriptions as of 09/16/2013  Medication Sig   . amLODipine (NORVASC) 10 MG tablet Take 1 tablet (10 mg total) by mouth daily.  Marland Kitchen aspirin 81 MG tablet Take 81 mg by mouth daily.    . cholecalciferol (VITAMIN D) 1000 UNITS tablet Take 2,000 Units by mouth daily.  Marland Kitchen ezetimibe-simvastatin (VYTORIN) 10-40 MG per tablet Take 1 tablet by mouth at bedtime.  Marland Kitchen levothyroxine (SYNTHROID, LEVOTHROID) 50 MCG tablet take 1 tablet by mouth once daily  . metFORMIN (GLUCOPHAGE) 1000 MG tablet take 1 tablet by mouth twice a day with food  . olmesartan-hydrochlorothiazide (BENICAR HCT) 40-25 MG per tablet Take 1 tablet by mouth daily.  Marland Kitchen omeprazole (PRILOSEC) 20 MG capsule take 1 capsule by mouth once daily  . saxagliptin HCl (ONGLYZA) 5 MG TABS tablet Take 1 tablet (5 mg total) by mouth daily.  . magnesium hydroxide (MILK OF MAGNESIA) 400 MG/5ML suspension Take 5 mLs by mouth daily as needed.  . Multiple Vitamins-Minerals (CENTRUM SILVER PO) Take 1 tablet by mouth daily.     No Known Allergies  ROS:  No fevers, chills, nausea, vomiting, diarrhea, rash, bleeding, bruising, headaches, shortness of breath, chest pain.  See HPI  PHYSICAL EXAM: BP 130/82  Pulse 68  Temp(Src) 98.2 F (36.8 C) (  Oral)  Ht $R'5\' 6"'Oc$  (1.676 m)  Wt 195 lb (88.451 kg)  BMI 31.49 kg/m2  Pleasant female, with nasal congestion/mouth breathing and frequent sniffling. No coughing HEENT: PERRL, EOMI, conjunctiva is clear.  TM's and EAC's are normal.  Nasal mucosa is moderately edematous, no purulence. Sinuses nontender. OP is clear Neck; no lymphadenopathy, thyromegaly or mass Heart: regular rate and rhythm, no  Murmur Lungs: clear bilaterally Skin: no rash Neuro: alert and oriented. Cranial nerves intact  ASSESSMENT/PLAN:  Acute upper respiratory infections of unspecified site   Drink plenty of fluids. You can continue your current over-the-counter medications, OR change to Coridicin and Mucinex (guaifenesin). The coridicin will help dry the nasal drainage.  The mucinex keeps  the mucus thin. You can try the DM version of Mucinex (contains dextromethorphan, which is a cough suppressant) or use a separate delsym syrup, as needed for cough (mainly at bedtime).  Use saline spray in the nose, and consider trying sinus rinses (sinus rinse kit or neti-pot). You may use afrin spray at bedtime--use only for 2-3 days, and not all day long.  If used regularly, you run the risk of rebound swelling, in which case you will feel worse in the long-run.  Please read all the ingredients of the over-the-counter medications to ensure that ingredients aren't being duplicated (which is very common in combination cold medications). Avoid decongestants due to them increasing blood pressure (usually say "sinus" in the labels).  Call next week if your mucus becomes discolored, you develop sinus pain, or with other concerns.

## 2013-10-19 ENCOUNTER — Telehealth: Payer: Self-pay | Admitting: Family Medicine

## 2013-10-19 DIAGNOSIS — E782 Mixed hyperlipidemia: Secondary | ICD-10-CM

## 2013-10-19 DIAGNOSIS — I10 Essential (primary) hypertension: Secondary | ICD-10-CM

## 2013-10-19 NOTE — Telephone Encounter (Signed)
Pt states shoulder has gotten little worse so she is going to go ahead and get X-ray.  She req'd samples of Vytorin & Benicar.  Please call her if there are samples available that she can have.

## 2013-10-20 MED ORDER — EZETIMIBE-SIMVASTATIN 10-40 MG PO TABS: 1.0000 | ORAL_TABLET | Freq: Every day | ORAL | Status: DC

## 2013-10-20 MED ORDER — OLMESARTAN MEDOXOMIL-HCTZ 40-25 MG PO TABS: 1.0000 | ORAL_TABLET | Freq: Every day | ORAL | Status: DC

## 2013-10-20 NOTE — Telephone Encounter (Signed)
Called patient to let her know samples are ready to pick up.

## 2013-11-03 ENCOUNTER — Other Ambulatory Visit (INDEPENDENT_AMBULATORY_CARE_PROVIDER_SITE_OTHER): Payer: Federal, State, Local not specified - PPO

## 2013-11-03 DIAGNOSIS — Z23 Encounter for immunization: Secondary | ICD-10-CM

## 2013-11-08 ENCOUNTER — Encounter: Payer: Self-pay | Admitting: Family Medicine

## 2013-11-29 ENCOUNTER — Ambulatory Visit (INDEPENDENT_AMBULATORY_CARE_PROVIDER_SITE_OTHER): Payer: Federal, State, Local not specified - PPO | Admitting: Family Medicine

## 2013-11-29 ENCOUNTER — Encounter: Payer: Self-pay | Admitting: Family Medicine

## 2013-11-29 ENCOUNTER — Encounter: Payer: Federal, State, Local not specified - PPO | Admitting: Family Medicine

## 2013-11-29 ENCOUNTER — Other Ambulatory Visit (HOSPITAL_COMMUNITY)
Admission: RE | Admit: 2013-11-29 | Discharge: 2013-11-29 | Disposition: A | Discharge status: Home / Self Care | Payer: Federal, State, Local not specified - PPO | Source: Ambulatory Visit | Attending: Family Medicine | Admitting: Family Medicine

## 2013-11-29 VITALS — BP 146/82 | HR 64 | Ht 66.0 in | Wt 195.0 lb

## 2013-11-29 DIAGNOSIS — I10 Essential (primary) hypertension: Secondary | ICD-10-CM

## 2013-11-29 DIAGNOSIS — Z0000 Encounter for general adult medical examination without abnormal findings: Secondary | ICD-10-CM

## 2013-11-29 DIAGNOSIS — Z5181 Encounter for therapeutic drug level monitoring: Secondary | ICD-10-CM

## 2013-11-29 DIAGNOSIS — E559 Vitamin D deficiency, unspecified: Secondary | ICD-10-CM

## 2013-11-29 DIAGNOSIS — K219 Gastro-esophageal reflux disease without esophagitis: Secondary | ICD-10-CM

## 2013-11-29 DIAGNOSIS — E039 Hypothyroidism, unspecified: Secondary | ICD-10-CM

## 2013-11-29 DIAGNOSIS — Z1151 Encounter for screening for human papillomavirus (HPV): Secondary | ICD-10-CM | POA: Diagnosis present

## 2013-11-29 DIAGNOSIS — Z01419 Encounter for gynecological examination (general) (routine) without abnormal findings: Secondary | ICD-10-CM | POA: Insufficient documentation

## 2013-11-29 DIAGNOSIS — E119 Type 2 diabetes mellitus without complications: Secondary | ICD-10-CM

## 2013-11-29 DIAGNOSIS — E7800 Pure hypercholesterolemia, unspecified: Secondary | ICD-10-CM

## 2013-11-29 LAB — POCT URINALYSIS DIPSTICK
Bilirubin, UA: NEGATIVE
Glucose, UA: NEGATIVE
Ketones, UA: NEGATIVE
Nitrite, UA: NEGATIVE
PH UA: 6
Protein, UA: NEGATIVE
RBC UA: NEGATIVE
SPEC GRAV UA: 1.015
UROBILINOGEN UA: NEGATIVE

## 2013-11-29 LAB — POCT GLYCOSYLATED HEMOGLOBIN (HGB A1C): Hemoglobin A1C: 6.8

## 2013-11-29 MED ORDER — OMEPRAZOLE 20 MG PO CPDR: DELAYED_RELEASE_CAPSULE | ORAL | Status: AC

## 2013-11-29 MED ORDER — METFORMIN HCL 1000 MG PO TABS: ORAL_TABLET | ORAL | Status: DC

## 2013-11-29 MED ORDER — AMLODIPINE BESYLATE 10 MG PO TABS: 10.0000 mg | ORAL_TABLET | Freq: Every day | ORAL | Status: DC

## 2013-11-29 NOTE — Progress Notes (Addendum)
Chief Complaint  Patient presents with  . Annual Exam    fasting annual with pap/med check. UA showed 1+, no symptoms. Does want to let you know that the weakness on her right side has not helped. Was seeing doctor at [facility] and hasn't been in a while.    Ann Weiss is a 63 y.o. female who presents for a complete physical.  She has the following concerns:  She notices "lumps" in her neck, more noticeable on the right.  It is most noticeable in the morning, and it eventually goes away.  If she pushes it, she feels something salty in her mouth. Swelling is not persistent, not tender.  Diabetes follow-up: Blood sugars at home are running 80-120, average per monitor is 100. Denies hypoglycemia. Denies polydipsia and polyuria. Last eye exam was 01/2013. Patient follows a low sugar diet and checks feet regularly without concerns. Compliant with her medications, and denies side effects. She only eats 2x/day (9:30 am, and 4pm), no snacks. Walks about 20-30 mins on the track at the gym at least 3x/week. Not getting any weight-bearing activity. Denies chest pain, shortness of breath. Onglyza won't be covered starting in January. Recalls seeing Januvia as a covered med.  Hyperlipidemia follow-up: Patient is reportedly following a low-fat, low cholesterol diet. Compliant with medications and denies medication side effects   Hypertension follow-up: Blood pressures are only occasionally checked elsewhere usually runs 140/80. Denies dizziness, headaches, chest pain, edema. Denies side effects of medications.  Hypothyroidism: Compliant with taking medication, takes by itself first thing in the morning. She had some hair loss, but it continues to grow back (not as fast as she would like).Energy is good overall. Denies changes in bowels (has some chronic constipation), or any skin or mood changes.  +11# weight gain over the last year, although none since her recent visit.  She has been in school (for [employer])  and is more sedentary. She had ultrasound of her thyroid last year, and had nodule biopsied (R lobe) which was benign.  Vitamin D deficiency--compliant with taking 2000 IU daily. Last checked in May and was normal.  GERD: Well controlled with daily omeprazole. She can tell if she misses a day (mild recurrent symptoms, heaviness in chest). Denies dysphagia.   Immunization History  Administered Date(s) Administered  . Influenza Split 11/21/2011  . Influenza,inj,Quad PF,36+ Mos 11/23/2012, 11/03/2013  . Pneumococcal Polysaccharide-23 03/18/2006  . Tdap 05/19/2007  . Zoster 12/24/2012   Last Pap smear: 05/2010  Last mammogram: 06/2012 Last colonoscopy: 04/2010--poor prep. Was supposed to return for repeat, but she never did (Dr. Earlean Shawl) Last DEXA: 01/2010  Eye exam: 01/2013 Dentist: every 3 months Exercise: walks on the track at gym 20-30 minutes at least 3x/week  Past Medical History  Diagnosis Date  . Hypertension   . Diabetes mellitus   . Hyperlipidemia   . GERD (gastroesophageal reflux disease)   . Premature menopause age 4  . Constipation   . Vitamin D deficiency 2009  . Hypothyroidism   . Goiter     Past Surgical History  Procedure Laterality Date  . Esophagogastroduodenoscopy  [id number]  . Colonoscopy  [id number]  . Cesarean section      x 2  . Tubal ligation    . Foot surgery Right 01/2013    bone spur    History   Social History  . Marital Status: Widowed    Spouse Name: N/A    Number of Children: N/A  . Years of Education: N/A  Occupational History  . Retired from [employer]  . Smoking status: Never Smoker   . Smokeless tobacco: Never Used  . Alcohol Use: Yes     Comment: socially maybe 1-2 times monthly  . Drug Use: No  . Sexual Activity: Not Currently   Other Topics Concern  . Not on file   Social History Narrative   Lives alone.  Daughter lives in [place] and other daughter lives in [place], [place]. 4 grandchildren    Family  History  Problem Relation Age of Onset  . Hypertension Mother   . Kidney disease Mother     renal failure, dialysis  . Hypertension Father   . Stroke Father   . Arthritis Maternal Aunt   . Heart disease Maternal Aunt   . Diabetes Maternal Grandmother   . Cancer Neg Hx   . Breast cancer Neg Hx   . Colon cancer Neg Hx     Outpatient Encounter Prescriptions as of 11/29/2013  Medication Sig  . amLODipine (NORVASC) 10 MG tablet Take 1 tablet (10 mg total) by mouth daily.  Marland Kitchen aspirin 81 MG tablet Take 81 mg by mouth daily.    . cholecalciferol (VITAMIN D) 1000 UNITS tablet Take 2,000 Units by mouth daily.  Marland Kitchen ezetimibe-simvastatin (VYTORIN) 10-40 MG per tablet Take 1 tablet by mouth at bedtime.  Marland Kitchen levothyroxine (SYNTHROID, LEVOTHROID) 50 MCG tablet take 1 tablet by mouth once daily  . metFORMIN (GLUCOPHAGE) 1000 MG tablet take 1 tablet by mouth twice a day with food  . Multiple Vitamins-Minerals (CENTRUM SILVER PO) Take 1 tablet by mouth daily.    Marland Kitchen olmesartan-hydrochlorothiazide (BENICAR HCT) 40-25 MG per tablet Take 1 tablet by mouth daily.  Marland Kitchen omeprazole (PRILOSEC) 20 MG capsule take 1 capsule by mouth once daily  . saxagliptin HCl (ONGLYZA) 5 MG TABS tablet Take 1 tablet (5 mg total) by mouth daily.  . magnesium hydroxide (MILK OF MAGNESIA) 400 MG/5ML suspension Take 5 mLs by mouth daily as needed.    No Known Allergies  Review of Systems  The patient denies anorexia, fever, headaches, vision changes, decreased hearing, ear pain, sore throat, breast concerns, chest pain, palpitations, dizziness, syncope, dyspnea on exertion, cough, swelling, nausea, vomiting, diarrhea, constipation (some chronic/infrequent BM's but no straining), no abdominal pain, melena, hematochezia, indigestion/heartburn, hematuria, incontinence, dysuria, vaginal bleeding, discharge, odor or itch genital lesions, numbness, tingling, tremor, suspicious skin lesions, depression, anxiety, abnormal bleeding/bruising, or  enlarged lymph nodes.  +itching around c-section scar occasionally. 11# weight gain since last year L wrist pain--much improved.  Wears the brace when she is on the computer L shoulder pain--hasn't completely resolved, but is less frequent than in the past (she intends to get x-ray that was previously ordered) She continues to report weakness on her right side (arm and leg), previously evaluated by neuro, no treatment needed. Swelling in neck/under jaw in the mornings, as per HPI.   PHYSICAL EXAM:  BP 150/90 mmHg  Pulse 64  Ht _0  (1.676 m)  Wt 195 lb (88.451 kg)  BMI 31.49 kg/m2 146/82 RA on repeat by MD  General Appearance:  Alert, cooperative, no distress, appears stated age   Head:  Normocephalic, without obvious abnormality, atraumatic   Eyes:  PERRL, conjunctiva/corneas clear, EOM's intact, fundi  benign   Ears:  Normal TM's and external ear canals   Nose:  Nares normal, mucosa normal, no drainage or sinus tenderness   Throat:  Lips, mucosa, and tongue normal;  teeth and gums normal   Neck:  Supple, no lymphadenopathy; thyroid: enlarged, nodular.  No discrete/dominant mass noted; she has very minimal enlargement of submandibular salivary gland (where she states is usually more swollen in the mornings); nontender, no mass; no carotid bruit or JVD   Back:  Spine nontender, no curvature, ROM normal, no CVA tenderness. nontender at SI joints.   Lungs:  Clear to auscultation bilaterally without wheezes, rales or ronchi; respirations unlabored   Chest Wall:  No tenderness or deformity   Heart:  Regular rate and rhythm, S1 and S2 normal, no murmur, rub  or gallop   Breast Exam:  No tenderness, masses, or nipple discharge or inversion. No axillary lymphadenopathy   Abdomen:  Soft, non-tender, nondistended, normoactive bowel sounds,  no masses, no hepatosplenomegaly   Genitalia:  Normal external genitalia without lesions; mildly atrophic. BUS and vagina  normal; no cervical motion tenderness.cervix is without lesions  There is some friability to the vaginal tissue and cervix upon exam, related to atrophy. Uterus and adnexa not enlarged, nontender, no masses. Pap performed   Rectal:  Normal tone, no masses or tenderness; guaiac negative stool.   Extremities:  No clubbing, cyanosis or edema. Onychyomycotic, thickened right great toenail.  Some discoloration of left 5th toenail. Normal monofilament exam  Pulses:  2+ and symmetric all extremities   Skin:  Skin color, texture, turgor normal, no rashes or lesions.  Lymph nodes:  Cervical, supraclavicular, and axillary nodes normal   Neurologic:  CNII-XII intact, normal strength, sensation and gait; reflexes 2+ and symmetric throughout    Psych: Normal mood, affect, hygiene and grooming.    Lab Results  Component Value Date   HGBA1C 6.8 11/29/2013   (down from 7.1 in May) Urine dip: notable for 1+ leuks  ASSESSMENT/PLAN:  Annual physical exam - Plan: POCT Urinalysis Dipstick, Cytology - PAP [place]  Diabetes mellitus without complication - improved/at goal - Plan: HgB A1c, Comprehensive metabolic panel, Microalbumin / creatinine urine ratio, HM Diabetes Foot Exam  Essential hypertension, benign - above goal.  goals reviewed.  Weight loss, daily exercise, low sodium diet.  Monitor elsewhere.  Adjust meds if consistently >140 - Plan: Comprehensive metabolic panel, amLODipine (NORVASC) 10 MG tablet  Pure hypercholesterolemia - Plan: Lipid panel, Comprehensive metabolic panel  Hypothyroidism, unspecified hypothyroidism type - Plan: TSH  Vitamin D deficiency  Gastroesophageal reflux disease, esophagitis presence not specified - Plan: omeprazole (PRILOSEC) 20 MG capsule  Medication monitoring encounter - Plan: Lipid panel, Comprehensive metabolic panel, CBC with Differential, TSH  Type 2 diabetes mellitus without complication - Plan: metFORMIN  (GLUCOPHAGE) 1000 MG tablet   Suspect submandibular salivary gland swelling per her history. Encouraged to drink plenty of fluids; can try sugar-free lemon drops.  DM--declines change to Januvia now.  Will call in January for prescription, when needed.  Discussed monthly self breast exams and yearly mammograms; at least 30 minutes of aerobic activity at least 5 days/week; proper sunscreen use reviewed; healthy diet, including goals of calcium and vitamin D intake and alcohol recommendations (less than or equal to 1 drink/day) reviewed; regular seatbelt use; changing batteries in smoke detectors. Immunization recommendations discussed, UTD. Colonoscopy recommendations reviewed--she is past due (last had poor prep), reminded to schedule  c-met, lipid, TSH, urine microalb, CBC Pap

## 2013-11-29 NOTE — Patient Instructions (Addendum)
HEALTH MAINTENANCE RECOMMENDATIONS:  It is recommended that you get at least 30 minutes of aerobic exercise at least 5 days/week (for weight loss, you may need as much as 60-90 minutes). This can be any activity that gets your heart rate up. This can be divided in 10-15 minute intervals if needed, but try and build up your endurance at least once a week.  Weight bearing exercise is also recommended twice weekly.  Eat a healthy diet with lots of vegetables, fruits and fiber.  "Colorful" foods have a lot of vitamins (ie green vegetables, tomatoes, red peppers, etc).  Limit sweet tea, regular sodas and alcoholic beverages, all of which has a lot of calories and sugar.  Up to 1 alcoholic drink daily may be beneficial for women (unless trying to lose weight, watch sugars).  Drink a lot of water.  Calcium recommendations are 1200-1500 mg daily (1500 mg for postmenopausal women or women without ovaries), and vitamin D 1000 IU daily.  This should be obtained from diet and/or supplements (vitamins), and calcium should not be taken all at once, but in divided doses.  Monthly self breast exams and yearly mammograms for women over the age of 60 is recommended.  Sunscreen of at least SPF 30 should be used on all sun-exposed parts of the skin when outside between the hours of 10 am and 4 pm (not just when at beach or pool, but even with exercise, golf, tennis, and yard work!)  Use a sunscreen that says "broad spectrum" so it covers both UVA and UVB rays, and make sure to reapply every 1-2 hours.  Remember to change the batteries in your smoke detectors when changing your clock times in the spring and fall.  Use your seat belt every time you are in a car, and please drive safely and not be distracted with cell phones and texting while driving.  Please schedule your mammogram, colonoscopy (and yearly diabetic eye exam for January) Drink plenty of fluids.  If swelling in neck isn't going down, try sucking on  sugar-free lemon drops.  Return for evaluation if painful, fever or increasing size.  Your blood pressure is above goal (goal is <130-135/80-85. Limit sodium in your diet; get daily exercise, and lose weight. Periodically monitor your blood pressure, write down, and bring your list to your doctor visits  Low-Sodium Eating Plan Sodium raises blood pressure and causes water to be held in the body. Getting less sodium from food will help lower your blood pressure, reduce any swelling, and protect your heart, liver, and kidneys. We get sodium by adding salt (sodium chloride) to food. Most of our sodium comes from canned, boxed, and frozen foods. Restaurant foods, fast foods, and pizza are also very high in sodium. Even if you take medicine to lower your blood pressure or to reduce fluid in your body, getting less sodium from your food is important. WHAT IS MY PLAN? Most people should limit their sodium intake to 2,300 mg a day. Your health care provider recommends that you limit your sodium intake to __________ a day.  WHAT DO I NEED TO KNOW ABOUT THIS EATING PLAN? For the low-sodium eating plan, you will follow these general guidelines:  Choose foods with a % Daily Value for sodium of less than 5% (as listed on the food label).   Use salt-free seasonings or herbs instead of table salt or sea salt.   Check with your health care provider or pharmacist before using salt substitutes.   Eat  fresh foods.  Eat more vegetables and fruits.  Limit canned vegetables. If you do use them, rinse them well to decrease the sodium.   Limit cheese to 1 oz (28 g) per day.   Eat lower-sodium products, often labeled as "lower sodium" or "no salt added."  Avoid foods that contain monosodium glutamate (MSG). MSG is sometimes added to [place] food and some canned foods.  Check food labels (Nutrition Facts labels) on foods to learn how much sodium is in one serving.  Eat more home-cooked food and less  restaurant, buffet, and fast food.  When eating at a restaurant, ask that your food be prepared with less salt or none, if possible.  HOW DO I READ FOOD LABELS FOR SODIUM INFORMATION? The Nutrition Facts label lists the amount of sodium in one serving of the food. If you eat more than one serving, you must multiply the listed amount of sodium by the number of servings. Food labels may also identify foods as:  Sodium free--Less than 5 mg in a serving.  Very low sodium--35 mg or less in a serving.  Low sodium--140 mg or less in a serving.  Light in sodium--50% less sodium in a serving. For example, if a food that usually has 300 mg of sodium is changed to become light in sodium, it will have 150 mg of sodium.  Reduced sodium--25% less sodium in a serving. For example, if a food that usually has 400 mg of sodium is changed to reduced sodium, it will have 300 mg of sodium. WHAT FOODS CAN I EAT? Grains Low-sodium cereals, including oats, puffed wheat and rice, and shredded wheat cereals. Low-sodium crackers. Unsalted rice and pasta. Lower-sodium bread.  Vegetables Frozen or fresh vegetables. Low-sodium or reduced-sodium canned vegetables. Low-sodium or reduced-sodium tomato sauce and paste. Low-sodium or reduced-sodium tomato and vegetable juices.  Fruits Fresh, frozen, and canned fruit. Fruit juice.  Meat and Other Protein Products Low-sodium canned tuna and salmon. Fresh or frozen meat, poultry, seafood, and fish. Lamb. Unsalted nuts. Dried beans, peas, and lentils without added salt. Unsalted canned beans. Homemade soups without salt. Eggs.  Dairy Milk. Soy milk. Ricotta cheese. Low-sodium or reduced-sodium cheeses. Yogurt.  Condiments Fresh and dried herbs and spices. Salt-free seasonings. Onion and garlic powders. Low-sodium varieties of mustard and ketchup. Lemon juice.  Fats and Oils Reduced-sodium salad dressings. Unsalted butter.  Other Unsalted popcorn and pretzels.    The items listed above may not be a complete list of recommended foods or beverages. Contact your dietitian for more options. WHAT FOODS ARE NOT RECOMMENDED? Grains Instant hot cereals. Bread stuffing, pancake, and biscuit mixes. Croutons. Seasoned rice or pasta mixes. Noodle soup cups. Boxed or frozen macaroni and cheese. Self-rising flour. Regular salted crackers. Vegetables Regular canned vegetables. Regular canned tomato sauce and paste. Regular tomato and vegetable juices. Frozen vegetables in sauces. Salted french fries. Olives. Angie Fava. Relishes. Sauerkraut. Salsa. Meat and Other Protein Products Salted, canned, smoked, spiced, or pickled meats, seafood, or fish. Bacon, ham, sausage, hot dogs, corned beef, chipped beef, and packaged luncheon meats. Salt pork. Jerky. Pickled herring. Anchovies, regular canned tuna, and sardines. Salted nuts. Dairy Processed cheese and cheese spreads. Cheese curds. Blue cheese and cottage cheese. Buttermilk.  Condiments Onion and garlic salt, seasoned salt, table salt, and sea salt. Canned and packaged gravies. Worcestershire sauce. Tartar sauce. Barbecue sauce. Teriyaki sauce. Soy sauce, including reduced sodium. Steak sauce. Fish sauce. Oyster sauce. Cocktail sauce. Horseradish. Regular ketchup and mustard. Meat flavorings and tenderizers.  Bouillon cubes. Hot sauce. Tabasco sauce. Marinades. Taco seasonings. Relishes. Fats and Oils Regular salad dressings. Salted butter. Margarine. Ghee. Bacon fat.  Other Potato and tortilla chips. Corn chips and puffs. Salted popcorn and pretzels. Canned or dried soups. Pizza. Frozen entrees and pot pies.  The items listed above may not be a complete list of foods and beverages to avoid. Contact your dietitian for more information. Document Released: 06/15/2001 Document Revised: 12/29/2012 Document Reviewed: 10/28/2012 [facility] Patient Information 2015 [place], [place]. This information is not intended to replace  advice given to you by your health care provider. Make sure you discuss any questions you have with your health care provider.

## 2013-11-30 LAB — CBC WITH DIFFERENTIAL/PLATELET
BASOS PCT: 1 % (ref 0–1)
Basophils Absolute: 0.1 10*3/uL (ref 0.0–0.1)
EOS PCT: 8 % — AB (ref 0–5)
Eosinophils Absolute: 0.5 10*3/uL (ref 0.0–0.7)
HCT: 41.7 % (ref 36.0–46.0)
Hemoglobin: 13.6 g/dL (ref 12.0–15.0)
Lymphocytes Relative: 29 % (ref 12–46)
Lymphs Abs: 1.8 10*3/uL (ref 0.7–4.0)
MCH: 29.2 pg (ref 26.0–34.0)
MCHC: 32.6 g/dL (ref 30.0–36.0)
MCV: 89.7 fL (ref 78.0–100.0)
MPV: 12.1 fL (ref 9.4–12.4)
Monocytes Absolute: 0.4 10*3/uL (ref 0.1–1.0)
Monocytes Relative: 6 % (ref 3–12)
Neutro Abs: 3.5 10*3/uL (ref 1.7–7.7)
Neutrophils Relative %: 56 % (ref 43–77)
PLATELETS: 191 10*3/uL (ref 150–400)
RBC: 4.65 MIL/uL (ref 3.87–5.11)
RDW: 13.8 % (ref 11.5–15.5)
WBC: 6.3 10*3/uL (ref 4.0–10.5)

## 2013-11-30 LAB — LIPID PANEL
CHOL/HDL RATIO: 2.3 ratio
Cholesterol: 148 mg/dL (ref 0–200)
HDL: 64 mg/dL (ref >39)
LDL CALC: 74 mg/dL (ref 0–99)
Triglycerides: 51 mg/dL (ref <150)
VLDL: 10 mg/dL (ref 0–40)

## 2013-11-30 LAB — MICROALBUMIN / CREATININE URINE RATIO
Creatinine, Urine: 48.4 mg/dL
MICROALB/CREAT RATIO: 8.3 mg/g (ref 0.0–30.0)
Microalb, Ur: 0.4 mg/dL (ref <2.0)

## 2013-11-30 LAB — COMPREHENSIVE METABOLIC PANEL
ALK PHOS: 54 U/L (ref 39–117)
ALT: <8 U/L (ref 0–35)
AST: 14 U/L (ref 0–37)
Albumin: 4.3 g/dL (ref 3.5–5.2)
BILIRUBIN TOTAL: 0.4 mg/dL (ref 0.2–1.2)
BUN: 18 mg/dL (ref 6–23)
CO2: 30 mEq/L (ref 19–32)
Calcium: 9.4 mg/dL (ref 8.4–10.5)
Chloride: 102 mEq/L (ref 96–112)
Creat: 0.75 mg/dL (ref 0.50–1.10)
Glucose, Bld: 110 mg/dL — ABNORMAL HIGH (ref 70–99)
POTASSIUM: 3.9 meq/L (ref 3.5–5.3)
SODIUM: 140 meq/L (ref 135–145)
TOTAL PROTEIN: 7.1 g/dL (ref 6.0–8.3)

## 2013-11-30 LAB — TSH: TSH: 3.314 u[IU]/mL (ref 0.350–4.500)

## 2013-11-30 MED ORDER — LEVOTHYROXINE SODIUM 50 MCG PO TABS: ORAL_TABLET | ORAL | Status: DC

## 2013-11-30 NOTE — Addendum Note (Signed)
Addended by: Rita Ohara on: 11/30/2013 09:16 AM   Modules accepted: Orders

## 2013-12-01 LAB — CYTOLOGY - PAP

## 2013-12-02 ENCOUNTER — Encounter: Payer: Self-pay | Admitting: Family Medicine

## 2013-12-06 ENCOUNTER — Encounter: Payer: Self-pay | Admitting: Family Medicine

## 2013-12-21 ENCOUNTER — Other Ambulatory Visit: Payer: Self-pay

## 2013-12-21 DIAGNOSIS — Z1231 Encounter for screening mammogram for malignant neoplasm of breast: Secondary | ICD-10-CM

## 2013-12-27 ENCOUNTER — Other Ambulatory Visit: Payer: Self-pay | Admitting: Family Medicine

## 2013-12-27 ENCOUNTER — Telehealth: Payer: Self-pay | Admitting: Family Medicine

## 2013-12-27 DIAGNOSIS — E782 Mixed hyperlipidemia: Secondary | ICD-10-CM

## 2013-12-27 DIAGNOSIS — I10 Essential (primary) hypertension: Secondary | ICD-10-CM

## 2013-12-27 MED ORDER — EZETIMIBE-SIMVASTATIN 10-40 MG PO TABS: 1.0000 | ORAL_TABLET | Freq: Every day | ORAL | Status: DC

## 2013-12-27 MED ORDER — OLMESARTAN MEDOXOMIL-HCTZ 40-25 MG PO TABS: 1.0000 | ORAL_TABLET | Freq: Every day | ORAL | Status: DC

## 2013-12-27 NOTE — Telephone Encounter (Signed)
Pt requested samples of Vytorin & Benicar

## 2013-12-27 NOTE — Telephone Encounter (Signed)
Pt notified that samples are up front for pt

## 2013-12-27 NOTE — Telephone Encounter (Signed)
Ok for samples if we have available 

## 2014-01-06 ENCOUNTER — Ambulatory Visit: Payer: Federal, State, Local not specified - PPO

## 2014-01-14 ENCOUNTER — Ambulatory Visit
Admission: RE | Admit: 2014-01-14 | Discharge: 2014-01-14 | Disposition: A | Discharge status: Home / Self Care | Payer: Federal, State, Local not specified - PPO | Source: Ambulatory Visit

## 2014-01-14 DIAGNOSIS — Z1231 Encounter for screening mammogram for malignant neoplasm of breast: Secondary | ICD-10-CM

## 2014-01-14 IMAGING — MG MM SCREEN MAMMOGRAM BILATERAL
5 series · 5 of 5 positions shown · non-contrast
Comparison: Previous exam(s).

CLINICAL DATA: Screening.

EXAM:
DIGITAL SCREENING BILATERAL MAMMOGRAM WITH CAD

[R CC (1 of 2)]
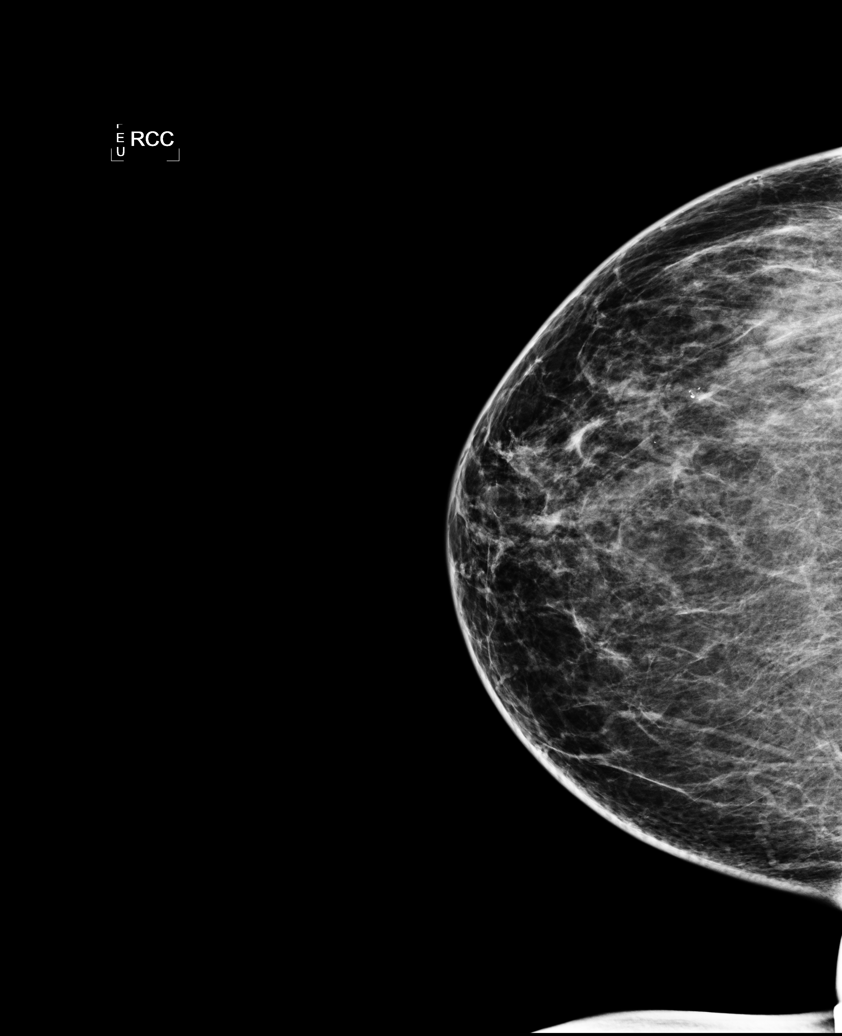

[L CC]
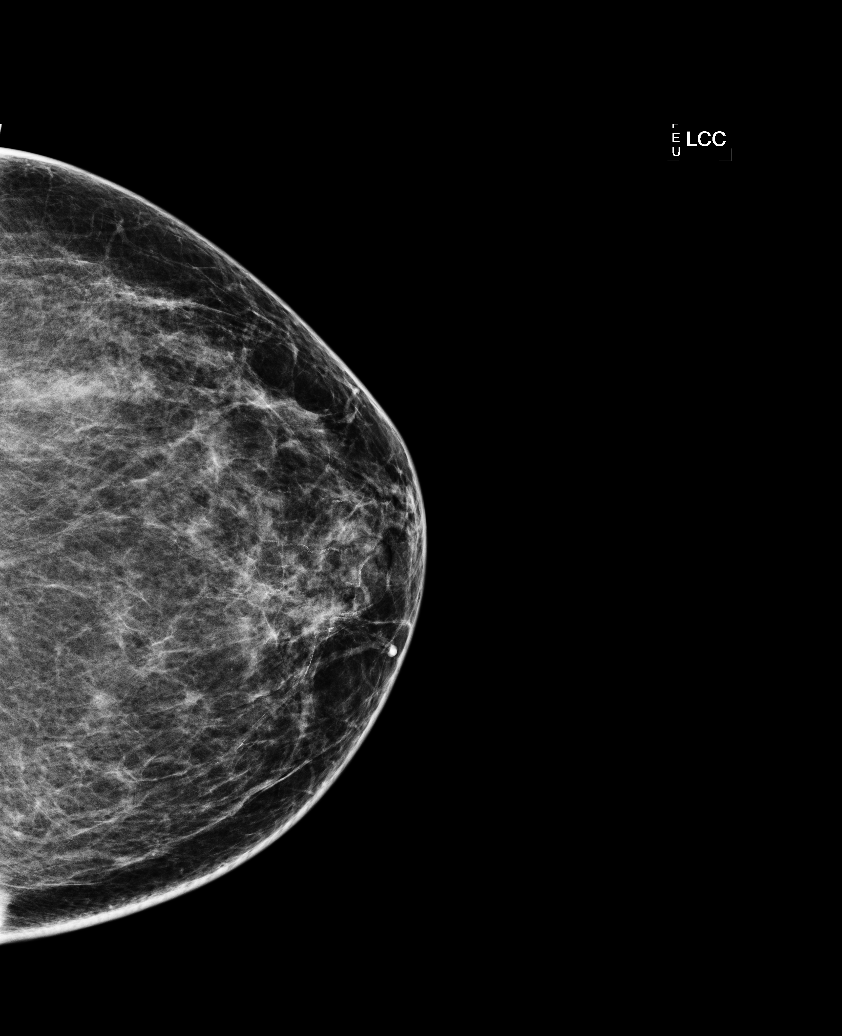

[L MLO]
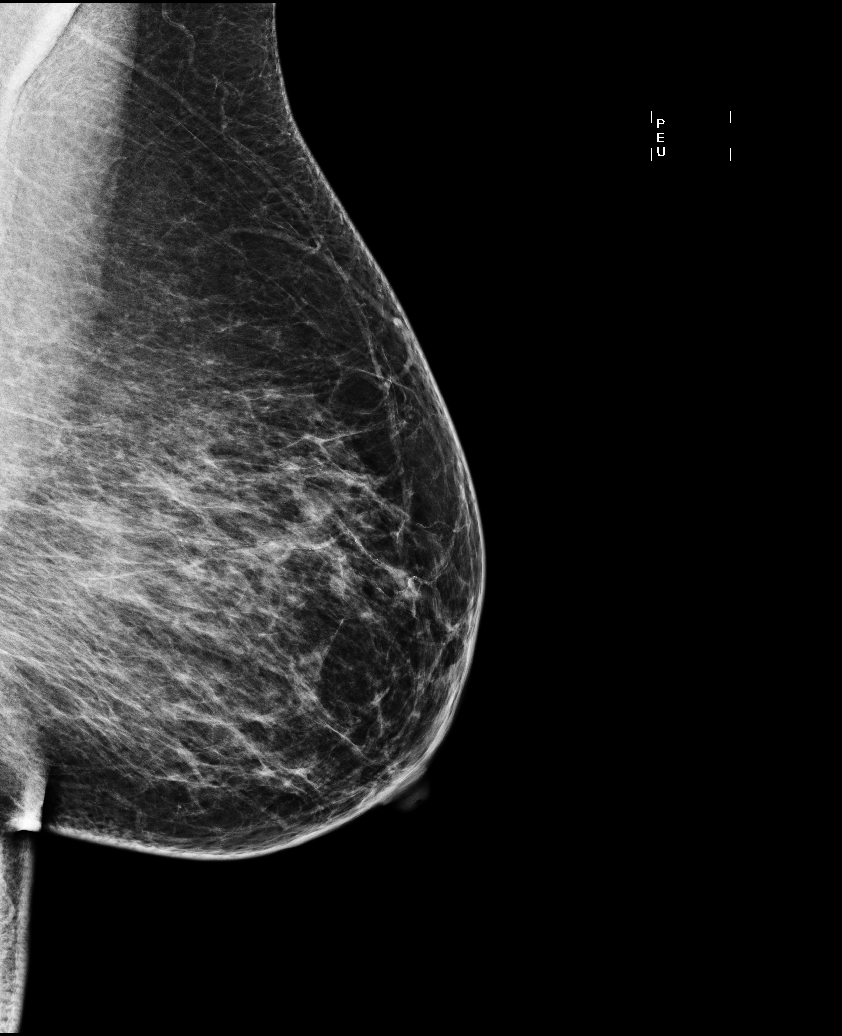

[R MLO]
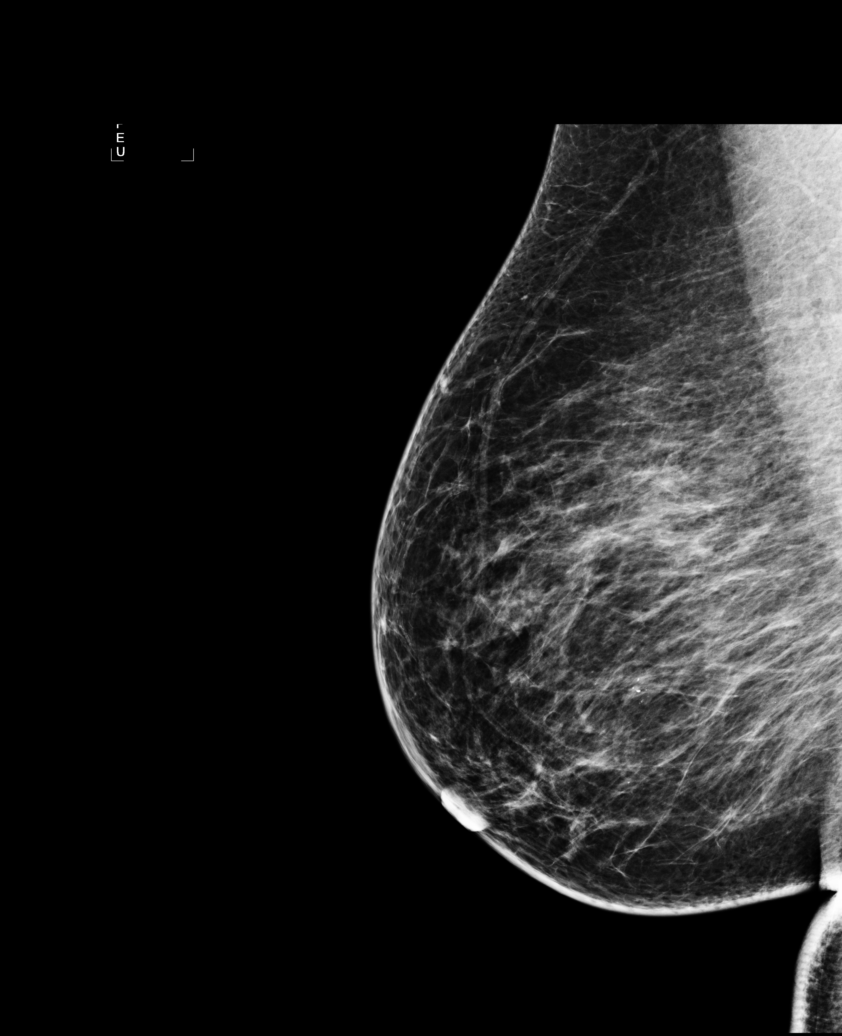

[R CC (2 of 2)]
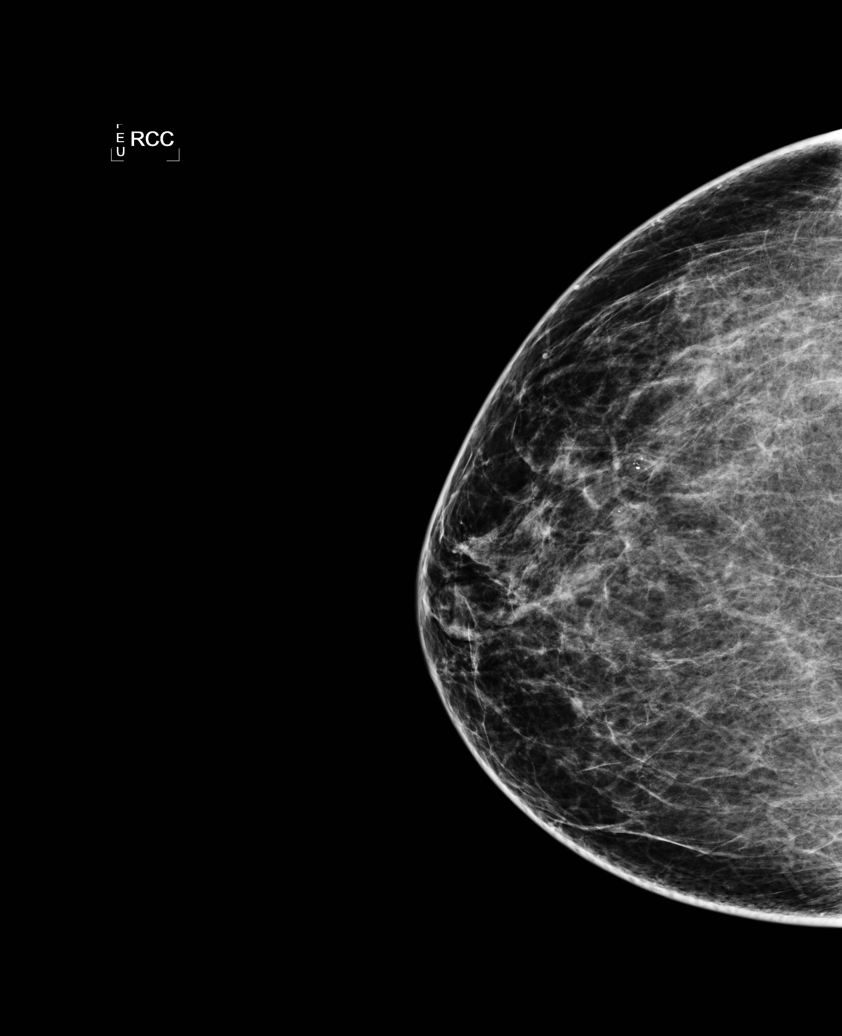

[5 of 5 positions shown; findings below may reference images not displayed]

ACR Breast Density Category b: There are scattered areas of
fibroglandular density.
FINDINGS: There are no findings suspicious for malignancy. Images were
processed with CAD.
IMPRESSION: No mammographic evidence of malignancy. A result letter of this
screening mammogram will be mailed directly to the patient.

RECOMMENDATION:
Screening mammogram in one year. (Code:AS-G-LCT)

BI-RADS CATEGORY  1: Negative.

## 2014-02-09 ENCOUNTER — Ambulatory Visit (INDEPENDENT_AMBULATORY_CARE_PROVIDER_SITE_OTHER): Payer: No Typology Code available for payment source | Admitting: Family Medicine

## 2014-02-09 ENCOUNTER — Encounter: Payer: Self-pay | Admitting: Family Medicine

## 2014-02-09 VITALS — BP 130/78 | HR 64 | Ht 66.0 in | Wt 201.0 lb

## 2014-02-09 DIAGNOSIS — M5412 Radiculopathy, cervical region: Secondary | ICD-10-CM

## 2014-02-09 MED ORDER — NAPROXEN 500 MG PO TABS: 500.0000 mg | ORAL_TABLET | Freq: Two times a day (BID) | ORAL | Status: DC

## 2014-02-09 NOTE — Progress Notes (Signed)
Chief Complaint  Patient presents with  . Numbness    left arm tingling and her fingers on her left hand feel kind of numb. Has been going on for a few months-has worsened over the last 3-4 days. Feels like a little bug running up and down her arm, unusual.    She has had left shoulder pain, and she admits she never went to get the x-ray for it.   Now she is getting a tingling sensation that started in her hands, and goes all the way up the arm. This started about a week or two ago.  It seems to be getting a little worse--started like a "bug running up and down the arm" and over the last week the tingling migrated down all the way into her fingers. She had similar complaints at a visit back in 10/2011.  She is on the computer every day, in school (for [employer]).  No change in activity. Denies neck pain. Shoulder pain doesn't bother her as much as it used to, and usually just at night when laying down--less frequent now.   At first she stated ALL fingers were involved, which is true, but notices it more in 3rd through 5th fingers. Numbness goes into upper arm, but not all the way to the shoulder.  Denies any weakness. Right foot will fall asleep sometimes if sitting in the same position for a long time--short-lived. Resolved by getting up and moving around.   Sugars remain good, ranging 80-100.  PMH, PSH, SH reviewed Outpatient Encounter Prescriptions as of 02/09/2014  Medication Sig  . amLODipine (NORVASC) 10 MG tablet Take 1 tablet (10 mg total) by mouth daily.  Marland Kitchen aspirin 81 MG tablet Take 81 mg by mouth daily.    Marland Kitchen BAYER CONTOUR TEST test strip TEST BLOOD SUGARS 3 TIMES DAILY  . cholecalciferol (VITAMIN D) 1000 UNITS tablet Take 2,000 Units by mouth daily.  Marland Kitchen ezetimibe-simvastatin (VYTORIN) 10-40 MG per tablet Take 1 tablet by mouth at bedtime.  Marland Kitchen levothyroxine (SYNTHROID, LEVOTHROID) 50 MCG tablet take 1 tablet by mouth once daily  . metFORMIN (GLUCOPHAGE) 1000 MG tablet take 1 tablet by  mouth twice a day with food  . Multiple Vitamins-Minerals (CENTRUM SILVER PO) Take 1 tablet by mouth daily.    Marland Kitchen olmesartan-hydrochlorothiazide (BENICAR HCT) 40-25 MG per tablet Take 1 tablet by mouth daily.  Marland Kitchen omeprazole (PRILOSEC) 20 MG capsule take 1 capsule by mouth once daily  . saxagliptin HCl (ONGLYZA) 5 MG TABS tablet Take 1 tablet (5 mg total) by mouth daily.  . magnesium hydroxide (MILK OF MAGNESIA) 400 MG/5ML suspension Take 5 mLs by mouth daily as needed.   No Known Allergies  Denies fevers, chills, headaches, dizziness, chest pain. No neck pain.  Denies nausea, vomiting, diarrhea, bleeding, bruising, rashes. Denies edema  BP 130/78 mmHg  Pulse 64  Ht 5\' 6"  (1.676 m)  Wt 201 lb (91.173 kg)  BMI 32.46 kg/m2  Well developed, pleasant female in no distress Neck: no c-spine tenderness or muscle spasm. No lymphadenopathy, or mass Upper extremities: 2+ pulses. Normal sensation.  Normal strength.  DTR's brisk, symmetric. Negative Phalen and Tinel.  Holding arms in phalen position she was aware of discomfort/tingling along the top part of her arm. No change during test. Symptoms in hand were constant (began prior to test, no change during test), mostly in 4th/5th fingers. Ulnar nerve nontender at left elbow. Heart: regular rate and rhythm Lungs: clear bilaterally  ASSESSMENT/PLAN:  Paresthesias L arm most  consistent with ulnar neuropathy based on location of symptoms in her hand.  Symptoms in upper arm is more consistent with C5, so cannot r/o cervical radiculopathy. Trial of NSAIDs. NSAID precautions were reviewed with the patient.  Patient should take medication with food, discontinue if develops GI side effects, not to take other OTC NSAIDs at the same time, and not to use longer than recommended.  If ongoing symptoms, may need EMG/NCV to further evaluate etiology (she recalls having one about 6 years ago, recalling it was negative for carpal tunnel, but no other details).

## 2014-02-09 NOTE — Patient Instructions (Signed)
Take your prescribed anti-inflammatory medication with food; discontinue or cut back the dose if you develop stomach pain/discomfort/side effects.  Do not take other over-the-counter pain medications such as ibuprofen, advil, motrin, aleve, naproxen at the same time.  Do not use longer than recommended.  It is okay to use acetaminophen (tylenol) along with this medication.  Return if symptoms persist, worsen, if there is associated weakness, or other problems.

## 2014-02-14 ENCOUNTER — Ambulatory Visit: Payer: Federal, State, Local not specified - PPO | Admitting: Family Medicine

## 2014-03-01 ENCOUNTER — Telehealth: Payer: Self-pay | Admitting: Family Medicine

## 2014-03-01 DIAGNOSIS — E119 Type 2 diabetes mellitus without complications: Secondary | ICD-10-CM

## 2014-03-01 MED ORDER — SITAGLIPTIN PHOSPHATE 100 MG PO TABS: 100.0000 mg | ORAL_TABLET | Freq: Every day | ORAL | Status: DC

## 2014-03-01 NOTE — Telephone Encounter (Signed)
Pt requested refill on Januvia and would like to pick this up today because she is out of meds. She thought this was refilled at her last visit. I dont see that she was on Januvia however I do see that she is on Onglyza.Ann Weiss

## 2014-03-01 NOTE — Telephone Encounter (Signed)
Per her last CPE, it was stated that her insurance wasn't going to cover Onglyza starting in January; pt recalled seeing that Januvia was covered.  She declined prescription at November appt, stating she would call when it was needed.  I went prescription for Januvia to her pharmacy.  She is due for a 6 month med check the end of May--it doesn't appear that she is scheduled.  Please schedule her for fasting med check. Thanks.

## 2014-03-01 NOTE — Telephone Encounter (Signed)
Called pt to inform that med has been sent to her pharmacy and also to schedule med check appt in May as indicated by Dr Tomi Bamberger. Left message for pt to call back to schedule the appt

## 2014-03-17 ENCOUNTER — Encounter: Payer: Federal, State, Local not specified - PPO | Admitting: Family Medicine

## 2014-04-19 ENCOUNTER — Telehealth: Payer: Self-pay | Admitting: Family Medicine

## 2014-04-19 NOTE — Telephone Encounter (Signed)
Pt called and requested samples of vytorin and benicar. Please call [phone] when ready.

## 2014-04-20 ENCOUNTER — Other Ambulatory Visit: Payer: Self-pay | Admitting: *Deleted

## 2014-04-20 DIAGNOSIS — I10 Essential (primary) hypertension: Secondary | ICD-10-CM

## 2014-04-20 DIAGNOSIS — E782 Mixed hyperlipidemia: Secondary | ICD-10-CM

## 2014-04-20 MED ORDER — EZETIMIBE-SIMVASTATIN 10-40 MG PO TABS: 1.0000 | ORAL_TABLET | Freq: Every day | ORAL | Status: DC

## 2014-04-20 MED ORDER — OLMESARTAN MEDOXOMIL-HCTZ 40-25 MG PO TABS: 1.0000 | ORAL_TABLET | Freq: Every day | ORAL | Status: DC

## 2014-05-25 ENCOUNTER — Encounter: Payer: No Typology Code available for payment source | Admitting: Family Medicine

## 2014-06-01 ENCOUNTER — Encounter: Payer: No Typology Code available for payment source | Admitting: Family Medicine

## 2014-06-20 ENCOUNTER — Encounter: Payer: Self-pay | Admitting: Family Medicine

## 2014-06-27 ENCOUNTER — Encounter: Payer: Self-pay | Admitting: Family Medicine

## 2014-06-27 ENCOUNTER — Ambulatory Visit (INDEPENDENT_AMBULATORY_CARE_PROVIDER_SITE_OTHER): Payer: No Typology Code available for payment source | Admitting: Family Medicine

## 2014-06-27 VITALS — BP 140/72 | HR 60 | Ht 66.0 in | Wt 204.0 lb

## 2014-06-27 DIAGNOSIS — R8290 Unspecified abnormal findings in urine: Secondary | ICD-10-CM | POA: Diagnosis not present

## 2014-06-27 DIAGNOSIS — E782 Mixed hyperlipidemia: Secondary | ICD-10-CM | POA: Diagnosis not present

## 2014-06-27 DIAGNOSIS — E119 Type 2 diabetes mellitus without complications: Secondary | ICD-10-CM | POA: Diagnosis not present

## 2014-06-27 DIAGNOSIS — E7800 Pure hypercholesterolemia, unspecified: Secondary | ICD-10-CM

## 2014-06-27 DIAGNOSIS — I10 Essential (primary) hypertension: Secondary | ICD-10-CM | POA: Diagnosis not present

## 2014-06-27 DIAGNOSIS — E039 Hypothyroidism, unspecified: Secondary | ICD-10-CM | POA: Diagnosis not present

## 2014-06-27 DIAGNOSIS — K219 Gastro-esophageal reflux disease without esophagitis: Secondary | ICD-10-CM

## 2014-06-27 LAB — POCT URINALYSIS DIPSTICK
BILIRUBIN UA: NEGATIVE
Blood, UA: NEGATIVE
GLUCOSE UA: NEGATIVE
Ketones, UA: NEGATIVE
NITRITE UA: NEGATIVE
Protein, UA: NEGATIVE
Spec Grav, UA: 1.025
UROBILINOGEN UA: NEGATIVE
pH, UA: 6

## 2014-06-27 LAB — LIPID PANEL
CHOL/HDL RATIO: 2.3 ratio
Cholesterol: 144 mg/dL (ref 0–200)
HDL: 63 mg/dL (ref >=46)
LDL CALC: 63 mg/dL (ref 0–99)
Triglycerides: 88 mg/dL (ref <150)
VLDL: 18 mg/dL (ref 0–40)

## 2014-06-27 LAB — HEPATIC FUNCTION PANEL
ALBUMIN: 3.8 g/dL (ref 3.5–5.2)
ALT: 8 U/L (ref 0–35)
AST: 15 U/L (ref 0–37)
Alkaline Phosphatase: 47 U/L (ref 39–117)
BILIRUBIN TOTAL: 0.4 mg/dL (ref 0.2–1.2)
Bilirubin, Direct: 0.1 mg/dL (ref 0.0–0.3)
Indirect Bilirubin: 0.3 mg/dL (ref 0.2–1.2)
Total Protein: 6.3 g/dL (ref 6.0–8.3)

## 2014-06-27 LAB — POCT GLYCOSYLATED HEMOGLOBIN (HGB A1C): HEMOGLOBIN A1C: 6.7

## 2014-06-27 LAB — GLUCOSE, RANDOM: GLUCOSE: 164 mg/dL — AB (ref 70–99)

## 2014-06-27 MED ORDER — EZETIMIBE-SIMVASTATIN 10-40 MG PO TABS: 1.0000 | ORAL_TABLET | Freq: Every day | ORAL | Status: DC

## 2014-06-27 MED ORDER — AMLODIPINE BESYLATE 10 MG PO TABS: 10.0000 mg | ORAL_TABLET | Freq: Every day | ORAL | Status: AC

## 2014-06-27 MED ORDER — METFORMIN HCL 1000 MG PO TABS: ORAL_TABLET | ORAL | Status: DC

## 2014-06-27 NOTE — Progress Notes (Signed)
Chief Complaint  Patient presents with  . Diabetes    fasting med check. Would like to change Januvia to due cost ($33 per month).  . Unusual odor of urine    started last week, symptoms just an odor.    She noticed an odor to her urine over the last 3-4 days.  She admits to not drinking enough water.  Denies urgency/frequency or dysuria, abdominal or flank pain.  Diabetes follow-up: She stopped taking Januvia about 3-4 weeks ago due to cost. Blood sugars at home are running 80-120.  Since stopping the Januvia, it has remained good, gone up to a high of 125 fasting.  After dinner usually runs 125-130.  Denies polydipsia and polyuria. Last eye exam was 01/2014. Patient follows a low sugar diet and checks feet regularly without concerns. She hasn't been exercising regularly (not sedentary, but no regular aerobic exercise--she used to walk about 20-30 mins on the track at the gym at least 3x/week). Not getting any weight-bearing activity. Denies chest pain, shortness of breath. Switched from [place] in January to [place] due to formulary change. Still finds it not affordable.  She does not have a copay/savings card to help with the cost.  Hyperlipidemia follow-up: Patient is reportedly following a low-fat, low cholesterol diet. Compliant with medications and denies medication side effects   Hypertension follow-up: Blood pressures are occasionally checks elsewhere usually runs 130-140/80. Denies dizziness, headaches, chest pain, edema. Denies side effects of medications.  Hypothyroidism: Compliant with taking medication, takes by itself first thing in the morning. She had some hair loss, but it continues to grow back (not as fast as she would like).Energy is good overall. Denies changes in bowels (has some chronic constipation), or any skin or mood changes.She has been a little tired, but attributes that to recently moving.  She had ultrasound of her thyroid 2014, and had nodule biopsied (R lobe)  which was benign. She denies any nodules, masses or concerns.  Vitamin D deficiency--compliant with taking 2000 IU daily.  GERD: Well controlled with daily omeprazole. She denies any symptoms if she misses a pill (previously did).  Denies dysphagia.   PMH, PSH, SH and FH reviewed/updated  Outpatient Encounter Prescriptions as of 06/27/2014  Medication Sig Note  . amLODipine (NORVASC) 10 MG tablet Take 1 tablet (10 mg total) by mouth daily.   Marland Kitchen aspirin 81 MG tablet Take 81 mg by mouth daily.     Marland Kitchen BAYER CONTOUR TEST test strip TEST BLOOD SUGARS 3 TIMES DAILY   . cholecalciferol (VITAMIN D) 1000 UNITS tablet Take 2,000 Units by mouth daily.   Marland Kitchen ezetimibe-simvastatin (VYTORIN) 10-40 MG per tablet Take 1 tablet by mouth at bedtime.   Marland Kitchen levothyroxine (SYNTHROID, LEVOTHROID) 50 MCG tablet take 1 tablet by mouth once daily   . metFORMIN (GLUCOPHAGE) 1000 MG tablet take 1 tablet by mouth twice a day with food   . Multiple Vitamins-Minerals (CENTRUM SILVER PO) Take 1 tablet by mouth daily.     Marland Kitchen olmesartan-hydrochlorothiazide (BENICAR HCT) 40-25 MG per tablet Take 1 tablet by mouth daily.   Marland Kitchen omeprazole (PRILOSEC) 20 MG capsule take 1 capsule by mouth once daily   . [DISCONTINUED] ezetimibe-simvastatin (VYTORIN) 10-40 MG per tablet Take 1 tablet by mouth at bedtime.   . magnesium hydroxide (MILK OF MAGNESIA) 400 MG/5ML suspension Take 5 mLs by mouth daily as needed.   . naproxen (NAPROSYN) 500 MG tablet Take 1 tablet (500 mg total) by mouth 2 (two) times daily with a  meal. (Patient not taking: Reported on 06/27/2014)   . sitaGLIPtin (JANUVIA) 100 MG tablet Take 1 tablet (100 mg total) by mouth daily. (Patient not taking: Reported on 06/27/2014) 06/27/2014: She has been out of this for almost a month   No facility-administered encounter medications on file as of 06/27/2014.   No Known Allergies  ROS:  No fever, chills, headaches, dizziness, chest pain, palpitations, shortness of breath, GI or GU  complaints other than odor to urine as noted in HPI. No bleeding, bruising, rash, mood changes or other concerns.  See HPI.  PHYSICAL EXAM: BP 140/72 mmHg  Pulse 60  Ht 5' 6" (1.676 m)  Wt 204 lb (92.534 kg)  BMI 32.94 kg/m2  Well developed, pleasant female in no distress. HEENT: PERRL, EOMI, conjunctiva clear. OP clear Neck: Borderline thyroid enlargement, no nodules palpable Heart: regular rate and rhythm without murmur Lungs: clear bilaterally Abdomen: soft, nontender, no organomegaly or mass Extremities: no edema, 2+ pulses.  Normal diabetic foot exam. Slight bruising at the base of R 3rd toe.  No swelling, warmth, just purplish discoloration (poss trauma, she can't recall).  Nontender. Skin: no rashes or other bruising noted Psych: normal mood, affect, hygiene and grooming Neuro: alert and oriented. Cranial nerves intact. Normal gait.  Lab Results  Component Value Date   HGBA1C 6.7 06/27/2014   Urine:  1+ leuks, SG 1.025  ASSESSMENT/PLAN:  Diabetes mellitus without complication - controlled; given copay card to help with [place] cost - Plan: HgB A1c, Glucose, random  Mixed hyperlipidemia - Plan: ezetimibe-simvastatin (VYTORIN) 10-40 MG per tablet, Lipid panel, Hepatic function panel  Abnormal urine odor - doubt UTI, check cx given leuks. increase fluid intake. - Plan: POCT Urinalysis Dipstick, Urine culture  Essential hypertension, benign - borderline.  daily exercise, low sodium diet, weight loss recommended - Plan: amLODipine (NORVASC) 10 MG tablet  Pure hypercholesterolemia  Controlled type 2 diabetes mellitus without complication  Hypothyroidism, unspecified hypothyroidism type  Gastroesophageal reflux disease, esophagitis presence not specified  Abnormal urinalysis - Plan: Urine culture  Essential hypertension, benign - Plan: amLODipine (NORVASC) 10 MG tablet  Type 2 diabetes mellitus without complication - Plan: metFORMIN (GLUCOPHAGE) 1000 MG  tablet  Restart regular exercise; add in weight-bearing exercise at least 2x/week. Consider a trial of taking omeprazole every other day--if you have recurrent symptoms of reflux/heartburn, then go back to taking it daily.  Glucose, LFT, lipid, urine culture  (full c-met, TSH and urine microalbumin, along with A1c and lipids again, due at physical)   F/u 6 months, sooner prn

## 2014-06-27 NOTE — Patient Instructions (Signed)
Restart regular exercise; add in weight-bearing exercise at least 2x/week. Consider a trial of taking omeprazole every other day--if you have recurrent symptoms of reflux/heartburn, then go back to taking it daily.  Use the copay savings card to help reduce the cost of your Januvia.  If that doesn't make it affordable, we can do a trial OFF of it, by rechecking your A1c after being off the medication in 3 months to see if it is still needed. I prefer that you stay on it, if possible.

## 2014-06-28 ENCOUNTER — Encounter: Payer: Self-pay | Admitting: Family Medicine

## 2014-06-28 LAB — URINE CULTURE

## 2014-07-20 ENCOUNTER — Telehealth: Payer: Self-pay | Admitting: Family Medicine

## 2014-07-20 ENCOUNTER — Other Ambulatory Visit: Payer: Self-pay | Admitting: *Deleted

## 2014-07-20 DIAGNOSIS — E119 Type 2 diabetes mellitus without complications: Secondary | ICD-10-CM

## 2014-07-20 DIAGNOSIS — I10 Essential (primary) hypertension: Secondary | ICD-10-CM

## 2014-07-20 MED ORDER — ALOGLIPTIN BENZOATE 25 MG PO TABS: 1.0000 | ORAL_TABLET | Freq: Every day | ORAL | Status: DC

## 2014-07-20 MED ORDER — OLMESARTAN MEDOXOMIL-HCTZ 40-25 MG PO TABS: 1.0000 | ORAL_TABLET | Freq: Every day | ORAL | Status: DC

## 2014-07-20 NOTE — Telephone Encounter (Signed)
Called patient and she said she was unable to schedule her follow up for December just yet but would call back when she could. Called in Benicar HCT x 5 [facility] to [facility] [place] [facility]. Patient states that the Laurey Morale is the preferred agent on her insurance-given a [employer] [facility] (up front and she will pick up). Please advise me of dosage and strength on Nesina and I will call in, thanks.

## 2014-07-20 NOTE — Telephone Encounter (Signed)
done

## 2014-07-20 NOTE — Telephone Encounter (Signed)
She is on Benicar HCT, not Benicar. Okay to fill x 5 months.  She is due for 6 month med check in December, and doesn't have it scheduled yet.  According to last visit, we gave her copay card for Januvia.  I wasn't aware of any change to [place].  This is in the same class as Januvia, so if this is a preferred agent over Januvia, we can switch (did she try the copay cards with the Januvia?).  She should also get a [place]es[place] copay card if we are switching.    Please schedule f/u.

## 2014-07-20 NOTE — Telephone Encounter (Signed)
Pt requesting a script for Benicar be sent in to her pharmacy as well as a script for Laurey Morale (pt says she is supposed to get this med but it is not listed on her med list yet). [facility]

## 2014-08-03 ENCOUNTER — Encounter (HOSPITAL_COMMUNITY): Payer: Self-pay | Admitting: Emergency Medicine

## 2014-08-03 ENCOUNTER — Other Ambulatory Visit (HOSPITAL_COMMUNITY)
Admission: RE | Admit: 2014-08-03 | Discharge: 2014-08-03 | Disposition: A | Discharge status: Home / Self Care | Payer: No Typology Code available for payment source | Source: Ambulatory Visit | Attending: Emergency Medicine | Admitting: Emergency Medicine

## 2014-08-03 ENCOUNTER — Emergency Department (INDEPENDENT_AMBULATORY_CARE_PROVIDER_SITE_OTHER)
Admission: EM | Admit: 2014-08-03 | Discharge: 2014-08-03 | Disposition: A | Discharge status: Home / Self Care | Payer: No Typology Code available for payment source | Source: Home / Self Care | Attending: Emergency Medicine | Admitting: Emergency Medicine

## 2014-08-03 DIAGNOSIS — Z113 Encounter for screening for infections with a predominantly sexual mode of transmission: Secondary | ICD-10-CM | POA: Diagnosis not present

## 2014-08-03 DIAGNOSIS — S39848A Other specified injuries of external genitals, initial encounter: Secondary | ICD-10-CM | POA: Diagnosis not present

## 2014-08-03 DIAGNOSIS — S3993XA Unspecified injury of pelvis, initial encounter: Secondary | ICD-10-CM

## 2014-08-03 DIAGNOSIS — N760 Acute vaginitis: Secondary | ICD-10-CM | POA: Insufficient documentation

## 2014-08-03 DIAGNOSIS — N390 Urinary tract infection, site not specified: Secondary | ICD-10-CM | POA: Diagnosis not present

## 2014-08-03 LAB — POCT URINALYSIS DIP (DEVICE)
Bilirubin Urine: NEGATIVE
GLUCOSE, UA: NEGATIVE mg/dL
Ketones, ur: NEGATIVE mg/dL
Nitrite: POSITIVE — AB
PROTEIN: NEGATIVE mg/dL
Specific Gravity, Urine: >=1.03 (ref 1.005–1.030)
Urobilinogen, UA: 0.2 mg/dL (ref 0.0–1.0)
pH: 5.5 (ref 5.0–8.0)

## 2014-08-03 MED ORDER — CEPHALEXIN 500 MG PO CAPS: 500.0000 mg | ORAL_CAPSULE | Freq: Four times a day (QID) | ORAL | Status: DC

## 2014-08-03 NOTE — ED Notes (Signed)
C/o bladder infection  States she has pain when urinating Is feeling fatigue

## 2014-08-03 NOTE — ED Provider Notes (Signed)
CSN: [id number]     Arrival date & time 08/03/14  1756 History   First MD Initiated Contact with Patient 08/03/14 1809     Chief Complaint  Patient presents with  . Cystitis   (Consider location/radiation/quality/duration/timing/severity/associated sxs/prior Treatment) HPI  She is a 64 year old woman here for evaluation of dysuria. She states that on Sunday morning she had sex for the first time in 3 years. They did not use condoms or any lubricant. She reports quite a bit of bleeding all day on Sunday. It stopped Monday morning. She denies any vaginal discharge. She does report dysuria. No abdominal pain or flank pain. No fevers or chills. She does report some fatigue.  Past Medical History  Diagnosis Date  . Hypertension   . Diabetes mellitus   . Hyperlipidemia   . GERD (gastroesophageal reflux disease)   . Premature menopause age 28  . Constipation   . Vitamin D deficiency 2009  . Hypothyroidism   . Goiter    Past Surgical History  Procedure Laterality Date  . Esophagogastroduodenoscopy  [id number]  . Colonoscopy  [id number]  . Cesarean section      x 2  . Tubal ligation    . Foot surgery Right 01/2013    bone spur   Family History  Problem Relation Age of Onset  . Hypertension Mother   . Kidney disease Mother     renal failure, dialysis  . Hypertension Father   . Stroke Father   . Arthritis Maternal Aunt   . Heart disease Maternal Aunt   . Diabetes Maternal Grandmother   . Cancer Neg Hx   . Breast cancer Neg Hx   . Colon cancer Neg Hx    History  Substance Use Topics  . Smoking status: Never Smoker   . Smokeless tobacco: Never Used  . Alcohol Use: Yes     Comment: socially maybe 1-2 times monthly   OB History    Gravida Para Term Preterm AB TAB SAB Ectopic Multiple Living   2 2        2      Review of Systems As in history of present illness Allergies  Review of patient's allergies indicates no known allergies.  Home Medications   Prior to Admission  medications   Medication Sig Start Date End Date Taking? Authorizing Provider  Alogliptin Benzoate 25 MG TABS Take 1 tablet by mouth daily. 07/20/14   Rita Ohara, MD  amLODipine (NORVASC) 10 MG tablet Take 1 tablet (10 mg total) by mouth daily. 06/27/14   Rita Ohara, MD  aspirin 81 MG tablet Take 81 mg by mouth daily.      Historical Provider, MD  BAYER CONTOUR TEST test strip TEST BLOOD SUGARS 3 TIMES DAILY 12/27/13   Rita Ohara, MD  cephALEXin (KEFLEX) 500 MG capsule Take 1 capsule (500 mg total) by mouth 4 (four) times daily. 08/03/14   Melony Overly, MD  cholecalciferol (VITAMIN D) 1000 UNITS tablet Take 2,000 Units by mouth daily.    Historical Provider, MD  ezetimibe-simvastatin (VYTORIN) 10-40 MG per tablet Take 1 tablet by mouth at bedtime. 06/27/14   Rita Ohara, MD  levothyroxine (SYNTHROID, LEVOTHROID) 50 MCG tablet take 1 tablet by mouth once daily 11/30/13   Rita Ohara, MD  magnesium hydroxide (MILK OF MAGNESIA) 400 MG/5ML suspension Take 5 mLs by mouth daily as needed.    Historical Provider, MD  metFORMIN (GLUCOPHAGE) 1000 MG tablet take 1 tablet by mouth twice a day with food 06/27/14  Rita Ohara, MD  Multiple Vitamins-Minerals (CENTRUM SILVER PO) Take 1 tablet by mouth daily.      Historical Provider, MD  naproxen (NAPROSYN) 500 MG tablet Take 1 tablet (500 mg total) by mouth 2 (two) times daily with a meal. Patient not taking: Reported on 06/27/2014 02/09/14   Rita Ohara, MD  olmesartan-hydrochlorothiazide (BENICAR HCT) 40-25 MG per tablet Take 1 tablet by mouth daily. 07/20/14   Rita Ohara, MD  omeprazole (PRILOSEC) 20 MG capsule take 1 capsule by mouth once daily 11/29/13   Rita Ohara, MD   BP 154/70 mmHg  Pulse 79  Temp(Src) 98.5 F (36.9 C) (Oral)  Resp 16  SpO2 100% Physical Exam  Constitutional: She is oriented to person, place, and time. She appears well-developed and well-nourished. No distress.  Cardiovascular: Normal rate.   Pulmonary/Chest: Effort normal.  Genitourinary: There  is no rash on the right labia. There is no rash on the left labia. Cervix exhibits no motion tenderness and no discharge. There is bleeding in the vagina. There are signs of injury in the vagina. No vaginal discharge found.  She has a mucosal tear just to the left of the cervix  Neurological: She is alert and oriented to person, place, and time.    ED Course  Procedures (including critical care time) Labs Review Labs Reviewed  POCT URINALYSIS DIP (DEVICE) - Abnormal; Notable for the following:    Hgb urine dipstick TRACE (*)    Nitrite POSITIVE (*)    Leukocytes, UA SMALL (*)    All other components within normal limits  CERVICOVAGINAL ANCILLARY ONLY    Imaging Review No results found.   MDM   1. UTI (lower urinary tract infection)   2. Vaginal trauma, initial encounter    Vaginal swabs collected. Will treat UTI with Keflex for 5 days. Recommended no sex for the next week. When she resumes sexual activity, discussed using a generous amount of water based lubricant. If she continues to have bleeding after sex, she should follow-up with her PCP to discuss vaginal estrogen.    Melony Overly, MD 08/03/14 [phone]

## 2014-08-03 NOTE — Discharge Instructions (Signed)
You have a urinary tract infection. Take Keflex one pill 4 times a day for the next 5 days.  You have a tear in your vagina. This is due to having sex when you haven't had sex for a while. No sex for the next week to allow it to heal. When you do have sex, use a generous amount of water based lubricant such as [place].  If you continue to have bleeding after sex, please see your primary care doctor.

## 2014-08-04 LAB — CERVICOVAGINAL ANCILLARY ONLY
Chlamydia: NEGATIVE
Neisseria Gonorrhea: NEGATIVE

## 2014-08-04 NOTE — ED Notes (Signed)
Final report GC negative, chlamydia negative. Still waiting for wet prep report

## 2014-08-05 LAB — CERVICOVAGINAL ANCILLARY ONLY: Wet Prep (BD Affirm): NEGATIVE

## 2014-08-05 NOTE — ED Notes (Signed)
Wet prep test negative, GC, chlamydia negative.

## 2014-08-25 ENCOUNTER — Telehealth: Payer: Self-pay

## 2014-08-25 NOTE — Telephone Encounter (Signed)
Received medical record request. Mailed records.

## 2014-09-06 ENCOUNTER — Other Ambulatory Visit (HOSPITAL_COMMUNITY)
Admission: RE | Admit: 2014-09-06 | Discharge: 2014-09-06 | Disposition: A | Discharge status: Home / Self Care | Payer: No Typology Code available for payment source | Source: Ambulatory Visit | Attending: Obstetrics & Gynecology | Admitting: Obstetrics & Gynecology

## 2014-09-06 ENCOUNTER — Other Ambulatory Visit: Payer: Self-pay | Admitting: Obstetrics & Gynecology

## 2014-09-06 DIAGNOSIS — Z01411 Encounter for gynecological examination (general) (routine) with abnormal findings: Secondary | ICD-10-CM | POA: Insufficient documentation

## 2014-09-06 DIAGNOSIS — Z1151 Encounter for screening for human papillomavirus (HPV): Secondary | ICD-10-CM | POA: Diagnosis not present

## 2014-09-07 LAB — CYTOLOGY - PAP

## 2014-10-07 ENCOUNTER — Other Ambulatory Visit: Payer: Self-pay | Admitting: Obstetrics & Gynecology

## 2014-11-25 ENCOUNTER — Other Ambulatory Visit: Payer: Self-pay | Admitting: Family Medicine

## 2014-12-24 ENCOUNTER — Other Ambulatory Visit: Payer: Self-pay | Admitting: Family Medicine

## 2015-06-02 ENCOUNTER — Other Ambulatory Visit: Payer: Self-pay | Admitting: Family Medicine

## 2015-08-07 ENCOUNTER — Other Ambulatory Visit: Payer: Self-pay | Admitting: Family Medicine

## 2015-08-07 DIAGNOSIS — E119 Type 2 diabetes mellitus without complications: Secondary | ICD-10-CM

## 2015-09-15 ENCOUNTER — Other Ambulatory Visit (HOSPITAL_COMMUNITY)
Admission: RE | Admit: 2015-09-15 | Discharge: 2015-09-15 | Disposition: A | Discharge status: Home / Self Care | Payer: No Typology Code available for payment source | Source: Ambulatory Visit | Attending: Obstetrics and Gynecology | Admitting: Obstetrics and Gynecology

## 2015-09-15 ENCOUNTER — Other Ambulatory Visit: Payer: Self-pay | Admitting: Obstetrics & Gynecology

## 2015-09-15 DIAGNOSIS — Z01419 Encounter for gynecological examination (general) (routine) without abnormal findings: Secondary | ICD-10-CM | POA: Diagnosis not present

## 2015-09-18 LAB — CYTOLOGY - PAP

## 2015-09-20 ENCOUNTER — Other Ambulatory Visit: Payer: Self-pay | Admitting: Obstetrics & Gynecology

## 2015-09-20 DIAGNOSIS — Z1231 Encounter for screening mammogram for malignant neoplasm of breast: Secondary | ICD-10-CM

## 2015-09-26 ENCOUNTER — Ambulatory Visit
Admission: RE | Admit: 2015-09-26 | Discharge: 2015-09-26 | Disposition: A | Discharge status: Home / Self Care | Payer: No Typology Code available for payment source | Source: Ambulatory Visit | Attending: Obstetrics & Gynecology | Admitting: Obstetrics & Gynecology

## 2015-09-26 DIAGNOSIS — Z1231 Encounter for screening mammogram for malignant neoplasm of breast: Secondary | ICD-10-CM

## 2015-09-26 IMAGING — MG DIGITAL SCREENING BILATERAL MAMMOGRAM WITH CAD
5 series · 5 of 5 positions shown · non-contrast
Comparison: Previous exam(s).

CLINICAL DATA: Screening.

EXAM:
DIGITAL SCREENING BILATERAL MAMMOGRAM WITH CAD

[R CC]
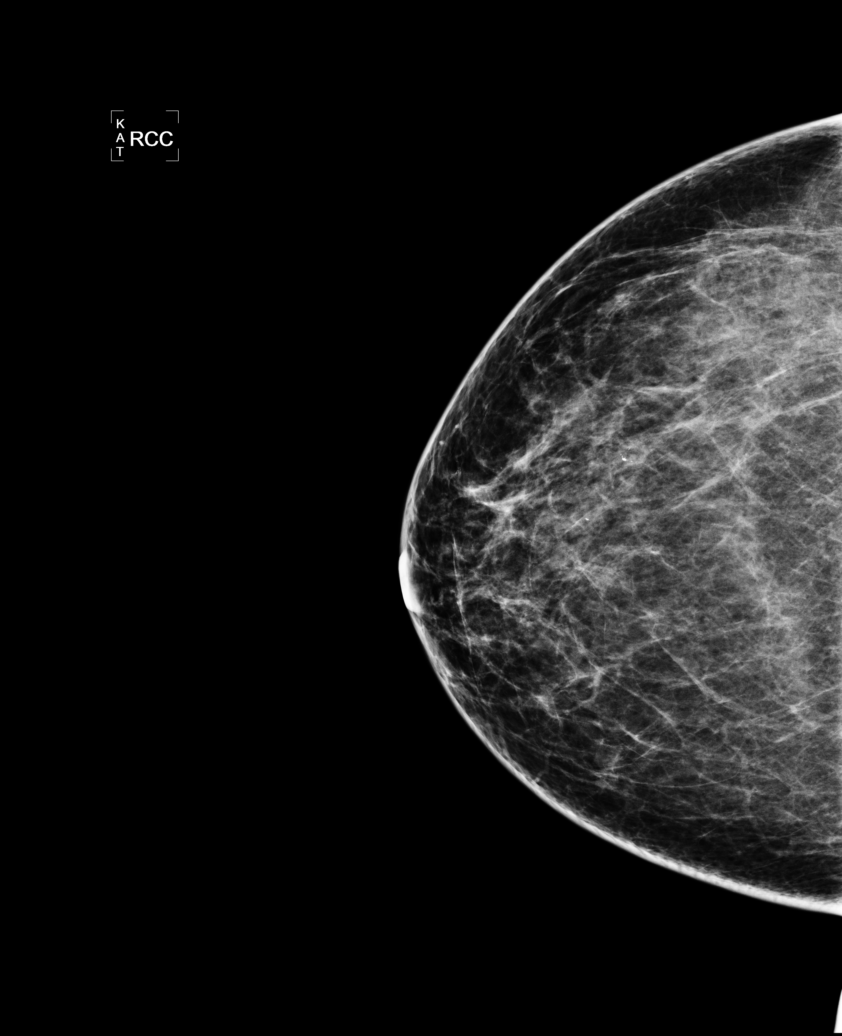

[L CC]
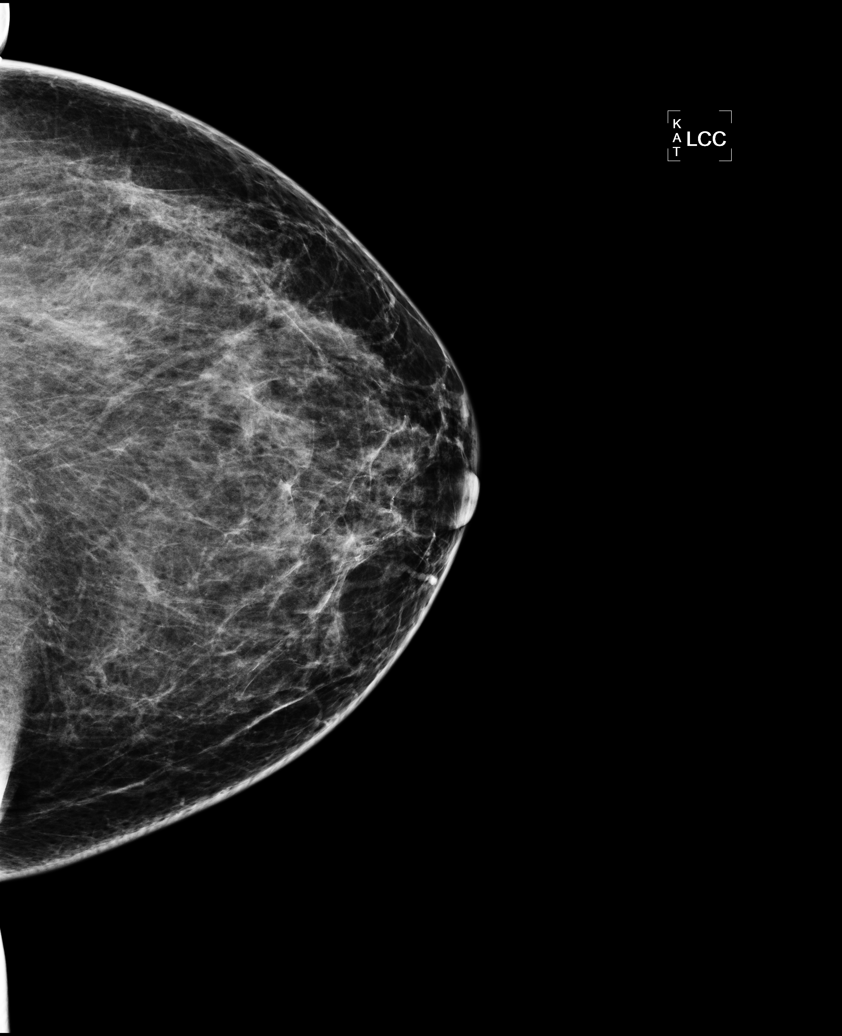

[L MLO]
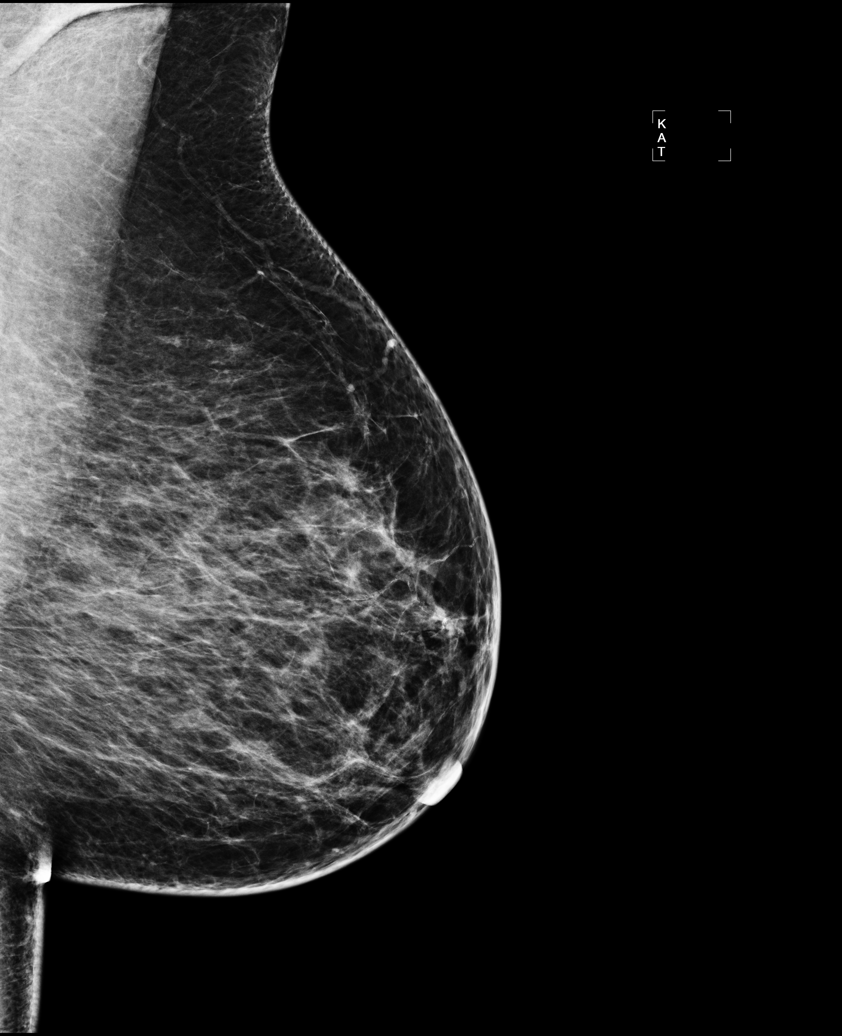

[R MLO (1 of 2)]
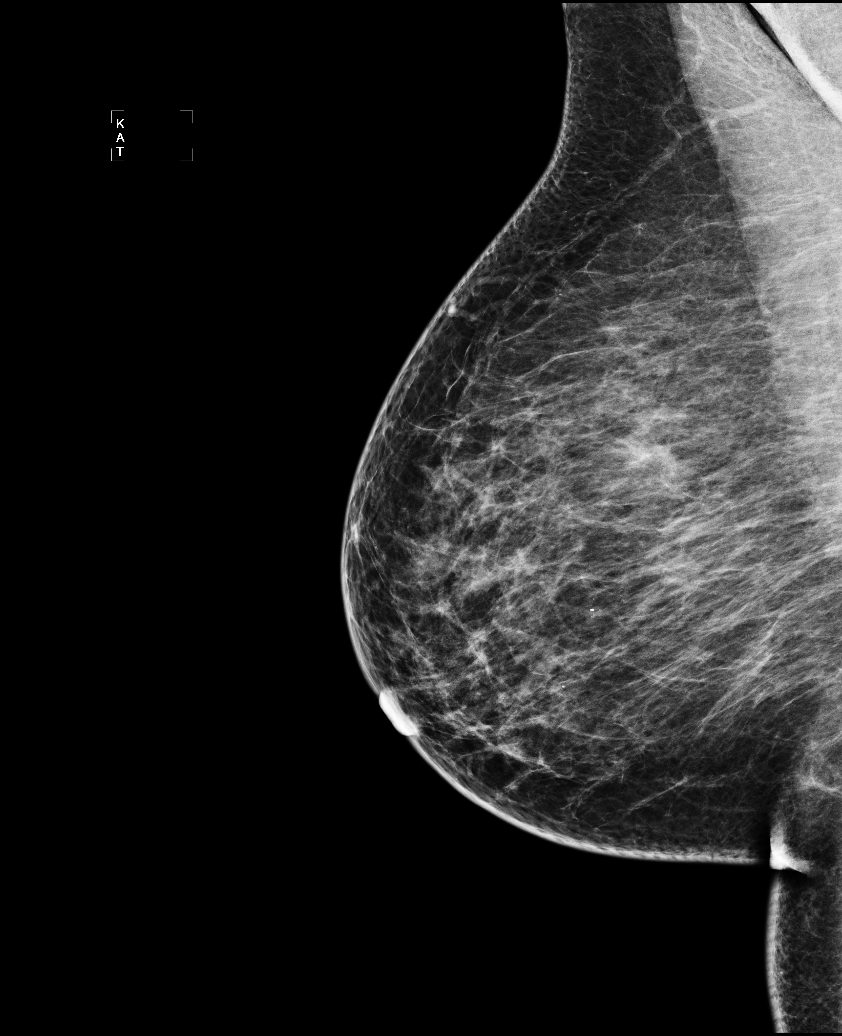

[R MLO (2 of 2)]
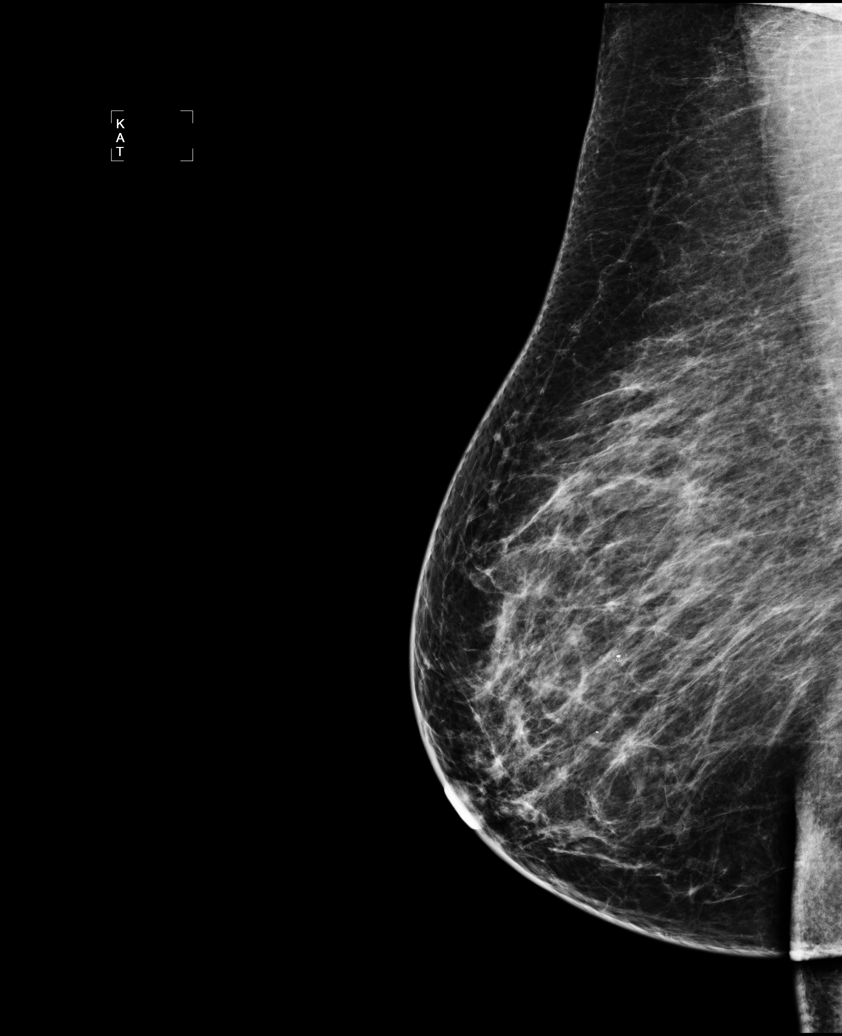

[5 of 5 positions shown; findings below may reference images not displayed]

ACR Breast Density Category b: There are scattered areas of
fibroglandular density.
FINDINGS: There are no findings suspicious for malignancy. Images were
processed with CAD.
IMPRESSION: No mammographic evidence of malignancy. A result letter of this
screening mammogram will be mailed directly to the patient.

RECOMMENDATION:
Screening mammogram in one year. (Code:AS-G-LCT)

BI-RADS CATEGORY  1: Negative.

## 2016-01-05 ENCOUNTER — Emergency Department (HOSPITAL_COMMUNITY): Payer: No Typology Code available for payment source

## 2016-01-05 ENCOUNTER — Encounter (HOSPITAL_COMMUNITY): Payer: Self-pay

## 2016-01-05 ENCOUNTER — Emergency Department (HOSPITAL_COMMUNITY)
Admission: EM | Admit: 2016-01-05 | Discharge: 2016-01-05 | Disposition: A | Discharge status: Home / Self Care | Payer: No Typology Code available for payment source | Attending: Emergency Medicine | Admitting: Emergency Medicine

## 2016-01-05 DIAGNOSIS — M25512 Pain in left shoulder: Secondary | ICD-10-CM | POA: Diagnosis present

## 2016-01-05 DIAGNOSIS — Z7982 Long term (current) use of aspirin: Secondary | ICD-10-CM | POA: Diagnosis not present

## 2016-01-05 DIAGNOSIS — E039 Hypothyroidism, unspecified: Secondary | ICD-10-CM | POA: Insufficient documentation

## 2016-01-05 DIAGNOSIS — Z79899 Other long term (current) drug therapy: Secondary | ICD-10-CM | POA: Diagnosis not present

## 2016-01-05 DIAGNOSIS — E119 Type 2 diabetes mellitus without complications: Secondary | ICD-10-CM | POA: Diagnosis not present

## 2016-01-05 DIAGNOSIS — M5412 Radiculopathy, cervical region: Secondary | ICD-10-CM | POA: Diagnosis not present

## 2016-01-05 DIAGNOSIS — I10 Essential (primary) hypertension: Secondary | ICD-10-CM | POA: Diagnosis not present

## 2016-01-05 LAB — CBC
HCT: 41.3 % (ref 36.0–46.0)
Hemoglobin: 13.7 g/dL (ref 12.0–15.0)
MCH: 30.2 pg (ref 26.0–34.0)
MCHC: 33.2 g/dL (ref 30.0–36.0)
MCV: 91 fL (ref 78.0–100.0)
PLATELETS: 234 10*3/uL (ref 150–400)
RBC: 4.54 MIL/uL (ref 3.87–5.11)
RDW: 13.3 % (ref 11.5–15.5)
WBC: 16.4 10*3/uL — AB (ref 4.0–10.5)

## 2016-01-05 LAB — BASIC METABOLIC PANEL
Anion gap: 9 (ref 5–15)
BUN: 13 mg/dL (ref 6–20)
CALCIUM: 9.7 mg/dL (ref 8.9–10.3)
CO2: 25 mmol/L (ref 22–32)
CREATININE: 0.95 mg/dL (ref 0.44–1.00)
Chloride: 103 mmol/L (ref 101–111)
Glucose, Bld: 253 mg/dL — ABNORMAL HIGH (ref 65–99)
Potassium: 4 mmol/L (ref 3.5–5.1)
SODIUM: 137 mmol/L (ref 135–145)

## 2016-01-05 LAB — I-STAT TROPONIN, ED: TROPONIN I, POC: 0 ng/mL (ref 0.00–0.08)

## 2016-01-05 IMAGING — DX DG CHEST 2V
2 series · 2 of 2 positions shown · non-contrast
Comparison: 02/13/2009

CLINICAL DATA: Left-sided chest pain and shoulder pain for 1 week.

EXAM:
CHEST  2 VIEW

[w chest pa]
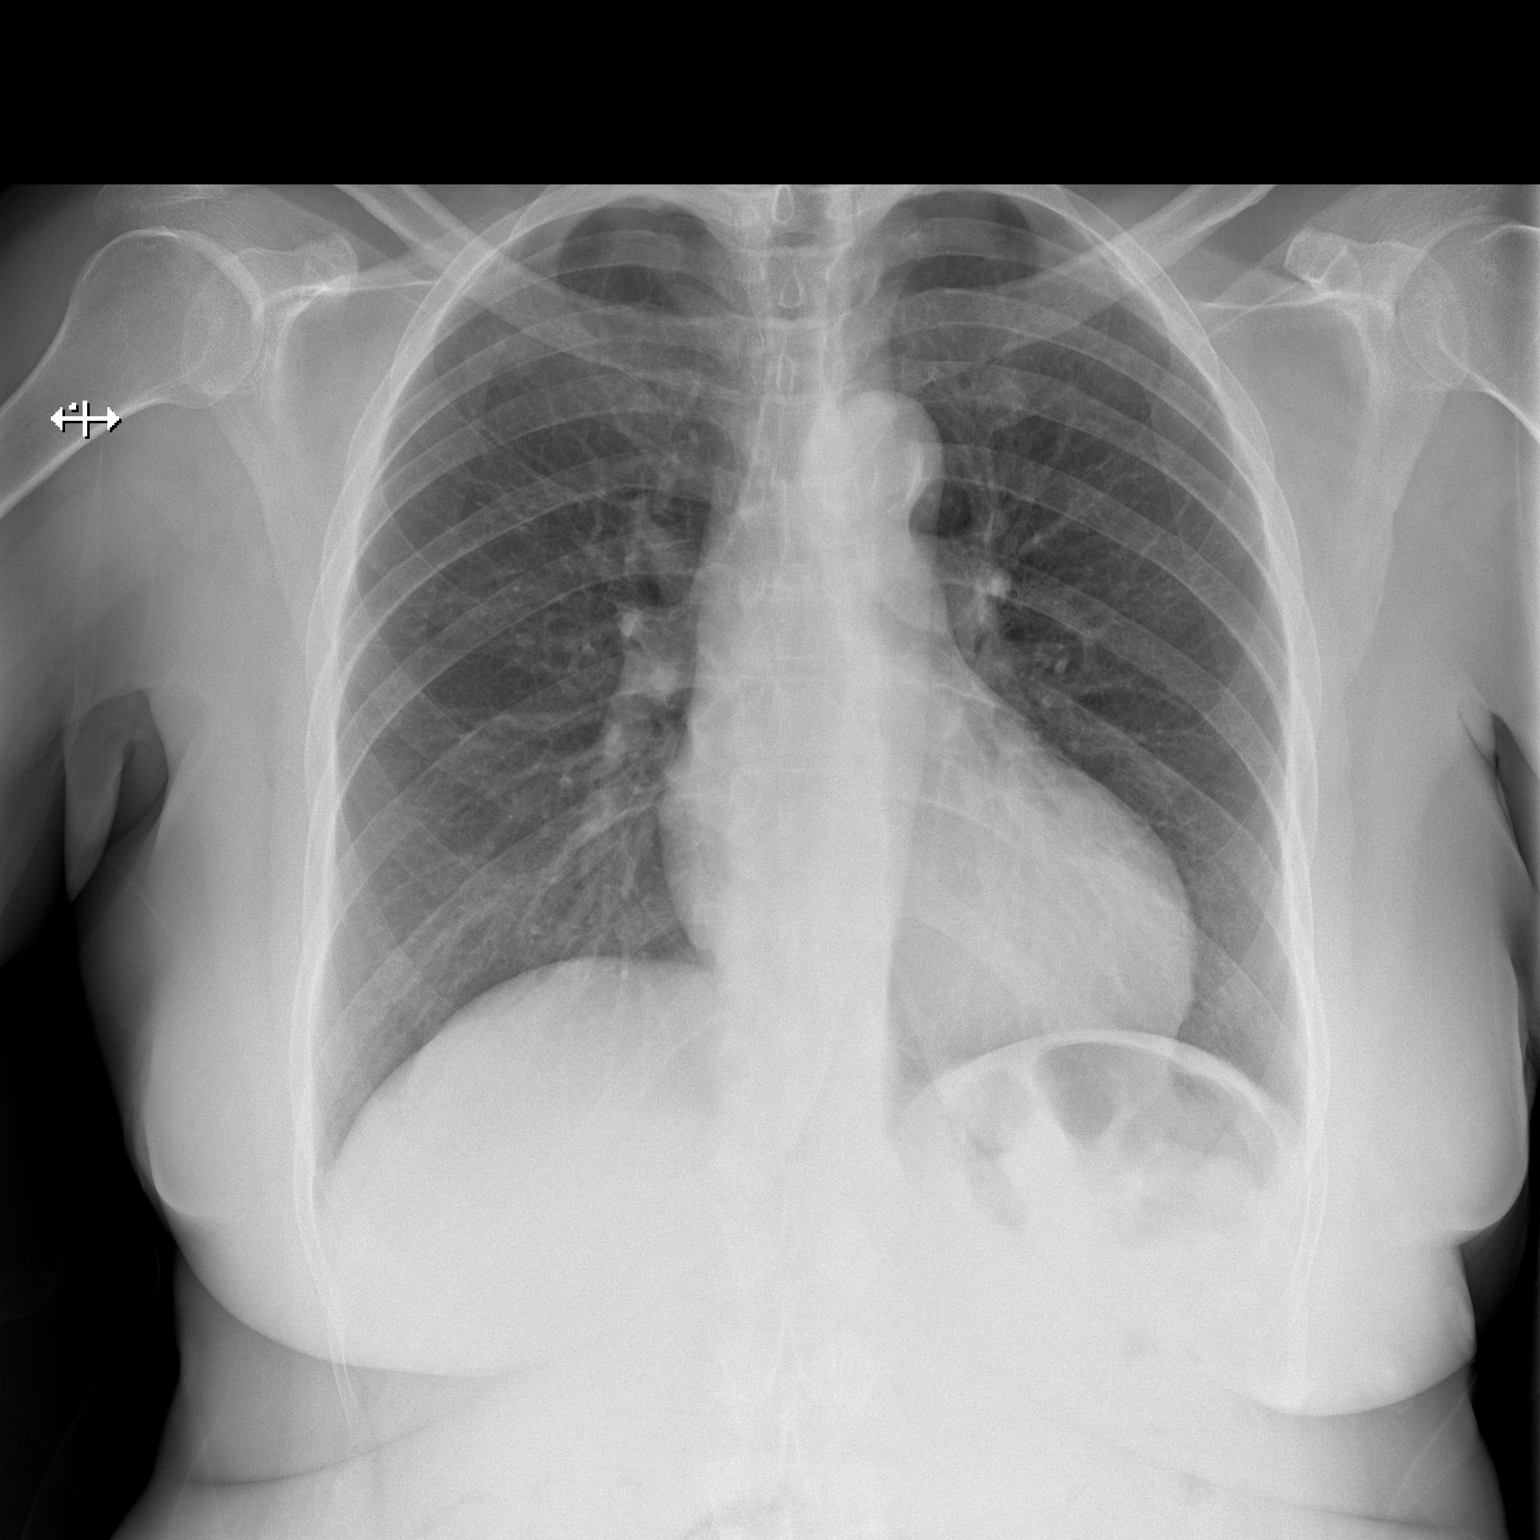

[w chest lat]
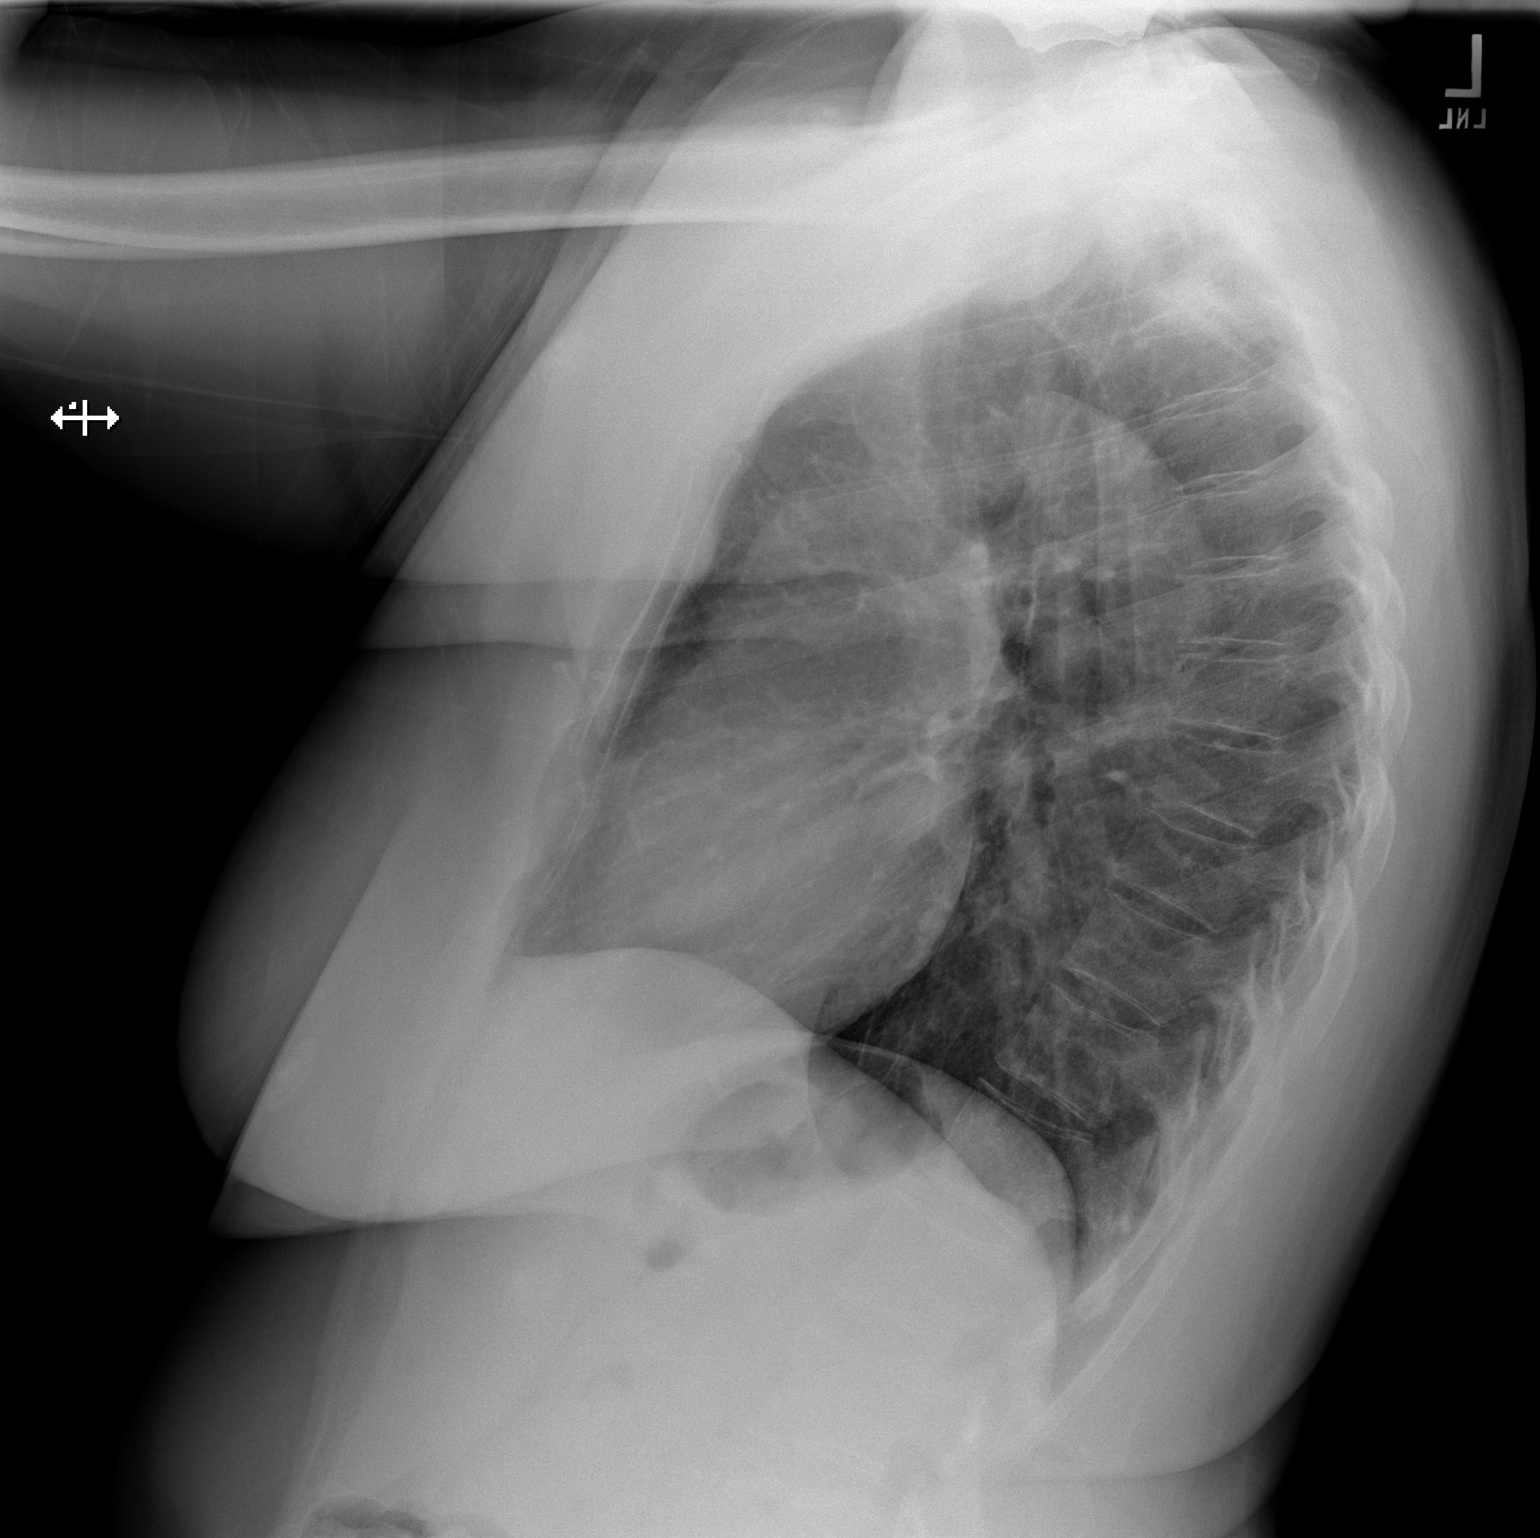

[2 of 2 positions shown; findings below may reference images not displayed]

FINDINGS: The cardiac silhouette, mediastinal and hilar contours are within
normal limits and stable. There is tortuosity and calcification of
the thoracic aorta. The lungs are clear. No pleural effusion. The
bony thorax is intact. Artifact noted from EKG leads.
IMPRESSION: No acute cardiopulmonary findings.

## 2016-01-05 MED ORDER — OXYCODONE-ACETAMINOPHEN 5-325 MG PO TABS: 1.0000 | ORAL_TABLET | ORAL | 0 refills | Status: DC | PRN

## 2016-01-05 NOTE — ED Provider Notes (Signed)
[facility] DEPT Provider Note   CSN: [id number] Arrival date & time: 01/05/16  1101     History   Chief Complaint Chief Complaint  Patient presents with  . Shoulder Pain    HPI Ann Weiss is a 65 y.o. female.  HPI Patient has had left posterior thoracic shoulder pain and arm pain for the past week. There was no specific injury. She reports she was in her home and it started suddenly. There was no associated injuries. She tried heat and cold on it with some mild relief. The following day she went on a car ride with family members to visit in [place]. She did not do the driving. She reports that because the pain persisted, she went to an emergency department in [place]. She reports they did a lot of tests including CT scanning. She does not have her records with her. She reports after her diagnostic evaluation, she was told that she had a abnormality at C5 that was causing her pain. She was instructed to follow-up with her primary doctor when she returned home. She was given a muscle relaxer tramadol to take for pain. It was somewhat helpful but the pain was still persistent. She did subsequently follow up with someone in her primary care office and had her discontinue the tramadol. She got started on a prednisone taper 2 days ago. She is taking Flexeril intermittently. She reports is particularly bad at night when she is trying to sleep when at that time sometimes a whole arm becomes very achy. She states she had a lot of trouble sleeping last night because of it. She has not developed any additional symptoms. She is not having any associated weakness or numbness. No associated chest pain fever or cough. No shortness of breath or lower extremity pain or calf swelling. No history of DVT. Past Medical History:  Diagnosis Date  . Constipation   . Diabetes mellitus   . GERD (gastroesophageal reflux disease)   . Goiter   . Hyperlipidemia   . Hypertension   . Hypothyroidism   .  Premature menopause age 6  . Vitamin D deficiency 2009    Patient Active Problem List   Diagnosis Date Noted  . GERD (gastroesophageal reflux disease) 11/23/2012  . Major depression in remission ([place]) 11/23/2012  . Vitamin D deficiency 04/24/2011  . Essential hypertension, benign 05/30/2010  . Pure hypercholesterolemia 05/30/2010  . Controlled type 2 diabetes mellitus without complication ([place]) [id number]  . Hypothyroidism 05/30/2010    Past Surgical History:  Procedure Laterality Date  . CESAREAN SECTION     x 2  . COLONOSCOPY  [id number]  . ESOPHAGOGASTRODUODENOSCOPY  [id number]  . FOOT SURGERY Right 01/2013   bone spur  . TUBAL LIGATION      OB History    Gravida Para Term Preterm AB Living   2 2       2    SAB TAB Ectopic Multiple Live Births                   Home Medications    Prior to Admission medications   Medication Sig Start Date End Date Taking? Authorizing Provider  Alogliptin Benzoate 25 MG TABS Take 1 tablet by mouth daily. 07/20/14   Rita Ohara, MD  amLODipine (NORVASC) 10 MG tablet Take 1 tablet (10 mg total) by mouth daily. 06/27/14   Rita Ohara, MD  aspirin 81 MG tablet Take 81 mg by mouth daily.      Historical Provider, MD  BAYER CONTOUR TEST test strip TEST BLOOD SUGARS 3 TIMES DAILY 12/27/13   Rita Ohara, MD  cephALEXin (KEFLEX) 500 MG capsule Take 1 capsule (500 mg total) by mouth 4 (four) times daily. 08/03/14   Melony Overly, MD  cholecalciferol (VITAMIN D) 1000 UNITS tablet Take 2,000 Units by mouth daily.    Historical Provider, MD  ezetimibe-simvastatin (VYTORIN) 10-40 MG per tablet Take 1 tablet by mouth at bedtime. 06/27/14   Rita Ohara, MD  levothyroxine (SYNTHROID, LEVOTHROID) 50 MCG tablet TAKE 1 TABLET BY MOUTH ONCE DAILY 12/26/14   Rita Ohara, MD  magnesium hydroxide (MILK OF MAGNESIA) 400 MG/5ML suspension Take 5 mLs by mouth daily as needed.    Historical Provider, MD  metFORMIN (GLUCOPHAGE) 1000 MG tablet take 1 tablet by mouth twice a day with  food 06/27/14   Rita Ohara, MD  Multiple Vitamins-Minerals (CENTRUM SILVER PO) Take 1 tablet by mouth daily.      Historical Provider, MD  naproxen (NAPROSYN) 500 MG tablet Take 1 tablet (500 mg total) by mouth 2 (two) times daily with a meal. Patient not taking: Reported on 06/27/2014 02/09/14   Rita Ohara, MD  olmesartan-hydrochlorothiazide (BENICAR HCT) 40-25 MG per tablet Take 1 tablet by mouth daily. 07/20/14   Rita Ohara, MD  omeprazole (PRILOSEC) 20 MG capsule take 1 capsule by mouth once daily 11/29/13   Rita Ohara, MD  oxyCODONE-acetaminophen (PERCOCET) 5-325 MG tablet Take 1-2 tablets by mouth every 4 (four) hours as needed. 01/05/16   Charlesetta Shanks, MD    Family History Family History  Problem Relation Age of Onset  . Hypertension Mother   . Kidney disease Mother     renal failure, dialysis  . Hypertension Father   . Stroke Father   . Arthritis Maternal Aunt   . Heart disease Maternal Aunt   . Diabetes Maternal Grandmother   . Cancer Neg Hx   . Breast cancer Neg Hx   . Colon cancer Neg Hx     Social History Social History  Substance Use Topics  . Smoking status: Never Smoker  . Smokeless tobacco: Never Used  . Alcohol use Yes     Comment: socially maybe 1-2 times monthly     Allergies   Patient has no known allergies.   Review of Systems Review of Systems 10 Systems reviewed and are negative for acute change except as noted in the HPI.   Physical Exam Updated Vital Signs BP 179/89 (BP Location: Left Arm)   Pulse 92   Temp 98 F (36.7 C) (Oral)   Resp 18   Ht 5\' 6"  (1.676 m)   Wt 204 lb (92.5 kg)   SpO2 96%   BMI 32.93 kg/m   Physical Exam  Constitutional: She appears well-developed and well-nourished. No distress.  HENT:  Head: Normocephalic and atraumatic.  Eyes: Conjunctivae and EOM are normal.  Neck: Neck supple. No tracheal deviation present. No thyromegaly present.  Cardiovascular: Normal rate and regular rhythm.   No murmur  heard. Pulmonary/Chest: Effort normal and breath sounds normal. No respiratory distress.  Abdominal: Soft. There is no tenderness.  Musculoskeletal: She exhibits no edema.  Reproducible discomfort to palpation at the medial top portion of the scapula. Slight tenderness in the trapezius and paraspinous muscle bodies. Normal soft tissue examination of the left upper extremity. Normal range of motion and intact strength testing at all joints.  Lymphadenopathy:    She has no cervical adenopathy.  Neurological: She is alert. No cranial nerve  deficit. She exhibits normal muscle tone. Coordination normal.  Skin: Skin is warm and dry.  Psychiatric: She has a normal mood and affect.  Nursing note and vitals reviewed.    ED Treatments / Results  Labs (all labs ordered are listed, but only abnormal results are displayed) Labs Reviewed  BASIC METABOLIC PANEL - Abnormal; Notable for the following:       Result Value   Glucose, Bld 253 (*)    All other components within normal limits  CBC - Abnormal; Notable for the following:    WBC 16.4 (*)    All other components within normal limits  I-STAT TROPOININ, ED    EKG  EKG Interpretation  Date/Time:  Friday January 05 2016 11:27:53 EST Ventricular Rate:  84 PR Interval:  152 QRS Duration: 98 QT Interval:  356 QTC Calculation: 420 R Axis:   -77 Text Interpretation:  Normal sinus rhythm Left axis deviation Abnormal ECG no STEMII. no sig change from old Confirmed by Johnney Killian, MD, Jeannie Done [phone]) on 01/05/2016 12:42:22 PM       Radiology Dg Chest 2 View  Result Date: 01/05/2016 CLINICAL DATA:  Left-sided chest pain and shoulder pain for 1 week. EXAM: CHEST  2 VIEW COMPARISON:  02/13/2009 FINDINGS: The cardiac silhouette, mediastinal and hilar contours are within normal limits and stable. There is tortuosity and calcification of the thoracic aorta. The lungs are clear. No pleural effusion. The bony thorax is intact. Artifact noted from EKG  leads. IMPRESSION: No acute cardiopulmonary findings. Electronically Signed   By: Marijo Sanes M.D.   On: 01/05/2016 11:57    Procedures Procedures (including critical care time)  Medications Ordered in ED Medications - No data to display   Initial Impression / Assessment and Plan / ED Course  I have reviewed the triage vital signs and the nursing notes.  Pertinent labs & imaging results that were available during my care of the patient were reviewed by me and considered in my medical decision making (see chart for details).  Clinical Course     Final Clinical Impressions(s) / ED Diagnoses   Final diagnoses:  Cervical radiculopathy  History and physical exam are consistent with a cervical radiculopathy. Symptoms have been present for approximately one week without other additional symptoms developing. Patient describes extensive workup while in [place]. Unfortunately those records are not immediately available however she does report having a diagnosis of C5 "something". This is consistent with today's findings. Patient does not have pain control medication aside from the prednisone which she started 2 days ago and Flexeril. Will add Percocet when necessary particularly for nighttime pain. Patient has follow-up with her PCP at the beginning of the week to determine referral possibly to orthopedics.  New Prescriptions New Prescriptions   OXYCODONE-ACETAMINOPHEN (PERCOCET) 5-325 MG TABLET    Take 1-2 tablets by mouth every 4 (four) hours as needed.     Charlesetta Shanks, MD 01/05/16 [phone]

## 2016-01-05 NOTE — ED Triage Notes (Signed)
Pt reports left shoulder pain, under her shoulder blade X1 week. She reports the pain radiates and causes "tingling" into her left arm. Pt denies cardiac hx.

## 2016-01-05 NOTE — ED Notes (Signed)
Pt A&OX4, ambulatory at d/c with steady gait, NAD 

## 2016-01-05 NOTE — ED Notes (Signed)
Pt taken to xray prior to being brought back to room. Pt currently at xray.

## 2016-01-05 NOTE — ED Notes (Signed)
Pt placed in gown, connected to cardiac monitor, continuous pulse ox and BO cuff.

## 2016-01-15 ENCOUNTER — Other Ambulatory Visit: Payer: Self-pay | Admitting: Family Medicine

## 2016-01-15 DIAGNOSIS — E119 Type 2 diabetes mellitus without complications: Secondary | ICD-10-CM

## 2016-01-15 NOTE — Telephone Encounter (Signed)
rx declined needs appt.

## 2016-10-04 ENCOUNTER — Other Ambulatory Visit: Payer: Self-pay | Admitting: Internal Medicine

## 2016-10-04 DIAGNOSIS — Z1231 Encounter for screening mammogram for malignant neoplasm of breast: Secondary | ICD-10-CM

## 2016-10-15 ENCOUNTER — Ambulatory Visit
Admission: RE | Admit: 2016-10-15 | Discharge: 2016-10-15 | Disposition: A | Discharge status: Home / Self Care | Payer: No Typology Code available for payment source | Source: Ambulatory Visit | Attending: Internal Medicine | Admitting: Internal Medicine

## 2016-10-15 DIAGNOSIS — Z1231 Encounter for screening mammogram for malignant neoplasm of breast: Secondary | ICD-10-CM

## 2017-11-07 ENCOUNTER — Other Ambulatory Visit: Payer: Self-pay | Admitting: Internal Medicine

## 2017-11-07 ENCOUNTER — Ambulatory Visit
Admission: RE | Admit: 2017-11-07 | Discharge: 2017-11-07 | Disposition: A | Discharge status: Home / Self Care | Payer: No Typology Code available for payment source | Source: Ambulatory Visit | Attending: Internal Medicine | Admitting: Internal Medicine

## 2017-11-07 DIAGNOSIS — R0789 Other chest pain: Secondary | ICD-10-CM

## 2017-11-07 IMAGING — CR DG RIBS W/ CHEST 3+V*R*
3 series · 3 of 3 positions shown · non-contrast
Comparison: 01/05/2016 chest radiograph.

CLINICAL DATA: Right chest wall pain after fall today

EXAM:
RIGHT RIBS AND CHEST - 3+ VIEW

[w chest pa]
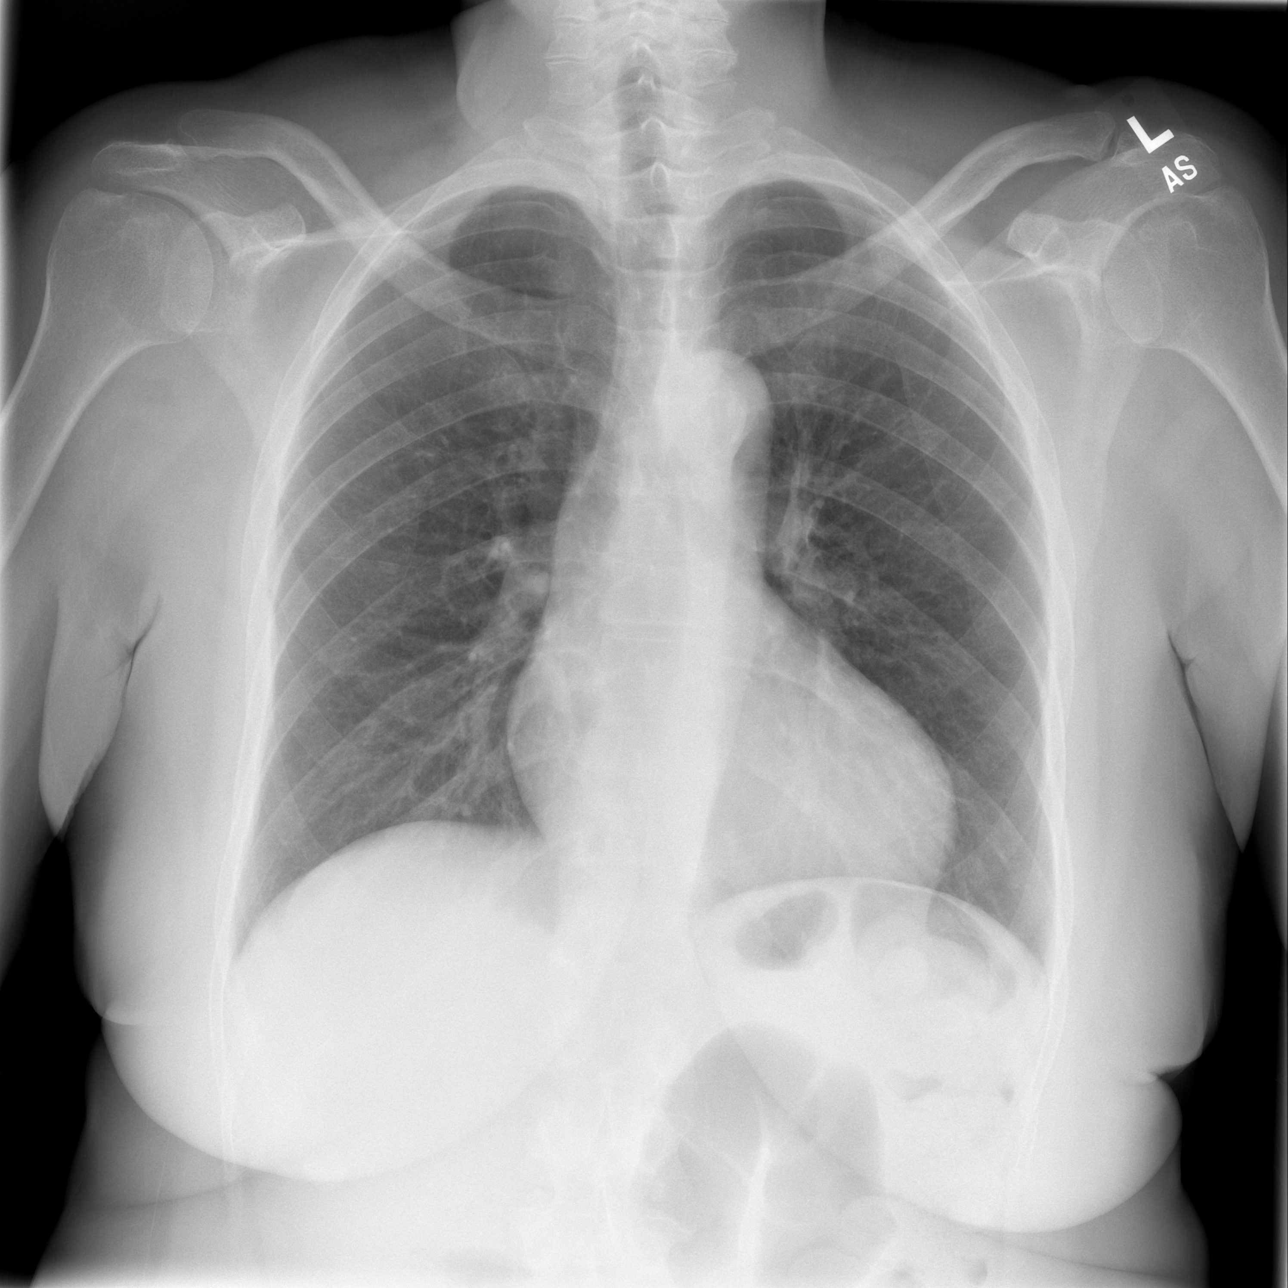

[w ribs ap/pa lower right *]
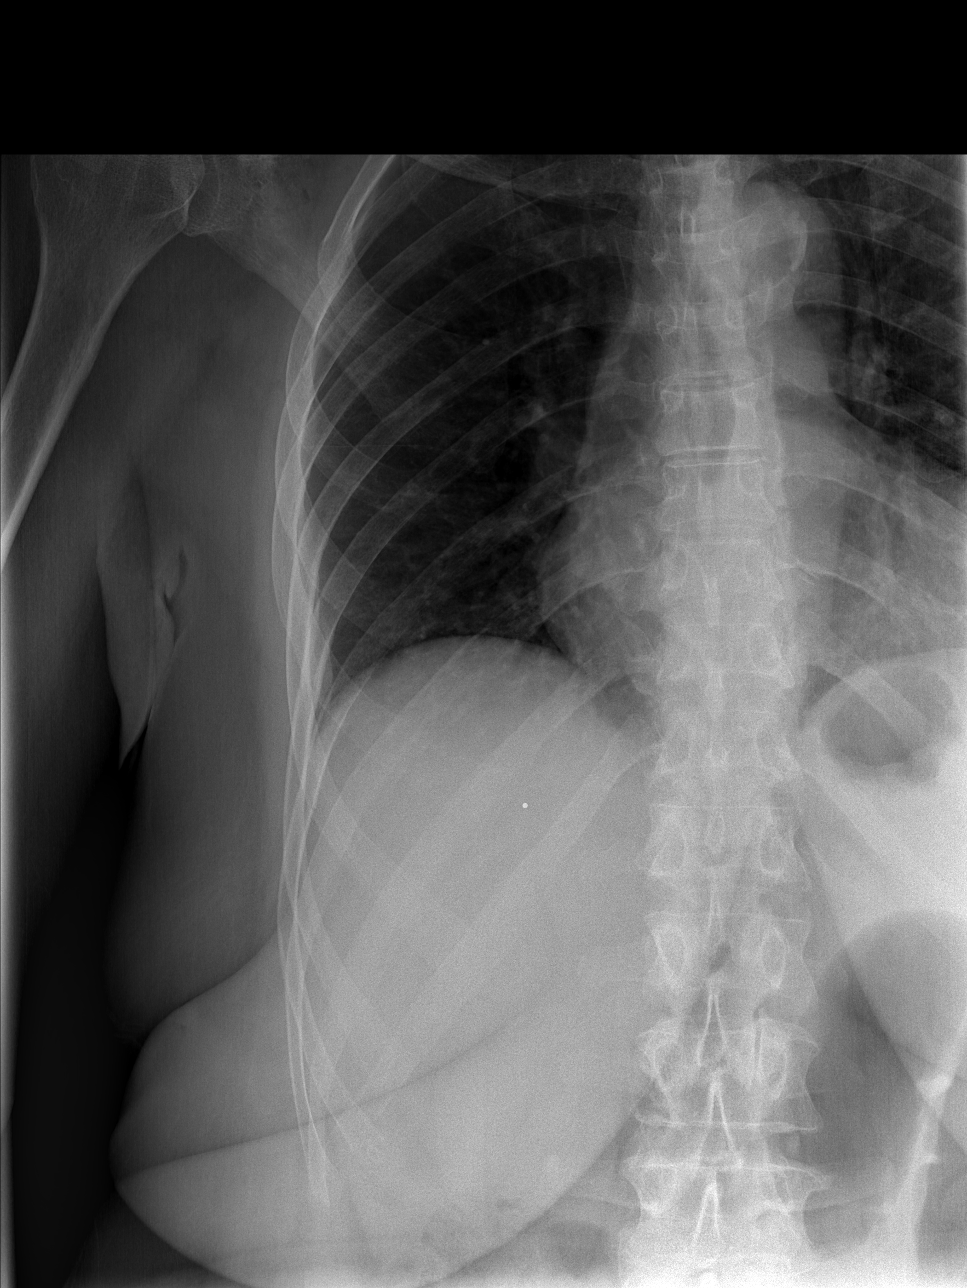

[w ribs oblique right *]
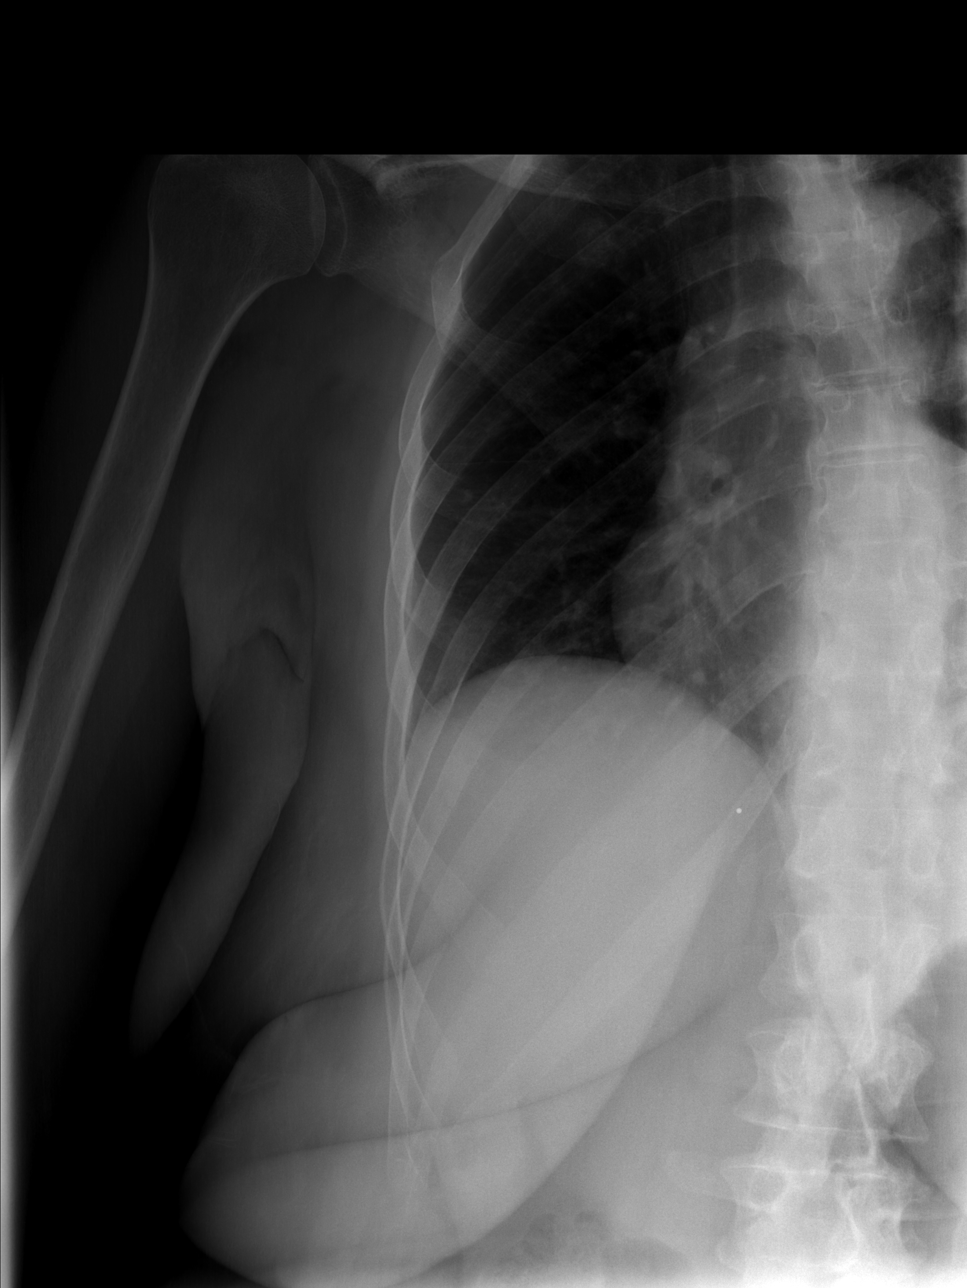

[3 of 3 positions shown; findings below may reference images not displayed]

FINDINGS: Stable cardiomediastinal silhouette with top-normal heart size. No
pneumothorax. No pleural effusion. Lungs appear clear, with no acute
consolidative airspace disease and no pulmonary edema. The area of
symptomatic concern as indicated by the patient in the lower right
chest wall was denoted with a metallic skin BB by the technologist.
Nondisplaced acute lateral right eighth rib fracture. No additional
rib fracture. No suspicious focal osseous lesions.
IMPRESSION: Nondisplaced acute lateral right eighth rib fracture. No
pneumothorax. No active cardiopulmonary disease.

These results will be called to the ordering clinician or
representative by the [HOSPITAL] at the imaging location.

## 2017-11-12 ENCOUNTER — Emergency Department: Payer: No Typology Code available for payment source

## 2017-11-12 ENCOUNTER — Encounter: Payer: Self-pay | Admitting: Emergency Medicine

## 2017-11-12 ENCOUNTER — Other Ambulatory Visit: Payer: Self-pay

## 2017-11-12 ENCOUNTER — Observation Stay
Admission: EM | Admit: 2017-11-12 | Discharge: 2017-11-13 | Disposition: A | Discharge status: Home / Self Care | Payer: No Typology Code available for payment source | Attending: Internal Medicine | Admitting: Internal Medicine

## 2017-11-12 ENCOUNTER — Observation Stay: Payer: No Typology Code available for payment source

## 2017-11-12 DIAGNOSIS — Z79899 Other long term (current) drug therapy: Secondary | ICD-10-CM | POA: Diagnosis not present

## 2017-11-12 DIAGNOSIS — K219 Gastro-esophageal reflux disease without esophagitis: Secondary | ICD-10-CM | POA: Diagnosis not present

## 2017-11-12 DIAGNOSIS — E559 Vitamin D deficiency, unspecified: Secondary | ICD-10-CM | POA: Insufficient documentation

## 2017-11-12 DIAGNOSIS — E119 Type 2 diabetes mellitus without complications: Secondary | ICD-10-CM | POA: Insufficient documentation

## 2017-11-12 DIAGNOSIS — R531 Weakness: Secondary | ICD-10-CM | POA: Insufficient documentation

## 2017-11-12 DIAGNOSIS — I639 Cerebral infarction, unspecified: Secondary | ICD-10-CM | POA: Diagnosis not present

## 2017-11-12 DIAGNOSIS — R29898 Other symptoms and signs involving the musculoskeletal system: Secondary | ICD-10-CM

## 2017-11-12 DIAGNOSIS — Z7984 Long term (current) use of oral hypoglycemic drugs: Secondary | ICD-10-CM | POA: Diagnosis not present

## 2017-11-12 DIAGNOSIS — R29700 NIHSS score 0: Secondary | ICD-10-CM | POA: Insufficient documentation

## 2017-11-12 DIAGNOSIS — Z23 Encounter for immunization: Secondary | ICD-10-CM | POA: Insufficient documentation

## 2017-11-12 DIAGNOSIS — E039 Hypothyroidism, unspecified: Secondary | ICD-10-CM | POA: Diagnosis not present

## 2017-11-12 DIAGNOSIS — Z79890 Hormone replacement therapy (postmenopausal): Secondary | ICD-10-CM | POA: Insufficient documentation

## 2017-11-12 DIAGNOSIS — I10 Essential (primary) hypertension: Secondary | ICD-10-CM | POA: Insufficient documentation

## 2017-11-12 DIAGNOSIS — Z7982 Long term (current) use of aspirin: Secondary | ICD-10-CM | POA: Insufficient documentation

## 2017-11-12 DIAGNOSIS — E785 Hyperlipidemia, unspecified: Secondary | ICD-10-CM | POA: Diagnosis not present

## 2017-11-12 DIAGNOSIS — I63522 Cerebral infarction due to unspecified occlusion or stenosis of left anterior cerebral artery: Secondary | ICD-10-CM | POA: Diagnosis present

## 2017-11-12 DIAGNOSIS — Z79891 Long term (current) use of opiate analgesic: Secondary | ICD-10-CM | POA: Insufficient documentation

## 2017-11-12 LAB — CBC WITH DIFFERENTIAL/PLATELET
Abs Immature Granulocytes: 0.03 10*3/uL (ref 0.00–0.07)
BASOS ABS: 0.1 10*3/uL (ref 0.0–0.1)
Basophils Relative: 1 %
EOS PCT: 7 %
Eosinophils Absolute: 0.5 10*3/uL (ref 0.0–0.5)
HCT: 39.6 % (ref 36.0–46.0)
Hemoglobin: 12.9 g/dL (ref 12.0–15.0)
IMMATURE GRANULOCYTES: 0 %
LYMPHS PCT: 31 %
Lymphs Abs: 2.3 10*3/uL (ref 0.7–4.0)
MCH: 30.1 pg (ref 26.0–34.0)
MCHC: 32.6 g/dL (ref 30.0–36.0)
MCV: 92.5 fL (ref 80.0–100.0)
Monocytes Absolute: 0.5 10*3/uL (ref 0.1–1.0)
Monocytes Relative: 7 %
NEUTROS PCT: 54 %
NRBC: 0 % (ref 0.0–0.2)
Neutro Abs: 4 10*3/uL (ref 1.7–7.7)
Platelets: 194 10*3/uL (ref 150–400)
RBC: 4.28 MIL/uL (ref 3.87–5.11)
RDW: 13.1 % (ref 11.5–15.5)
WBC: 7.4 10*3/uL (ref 4.0–10.5)

## 2017-11-12 LAB — URINALYSIS, COMPLETE (UACMP) WITH MICROSCOPIC
Bacteria, UA: NONE SEEN
Bilirubin Urine: NEGATIVE
GLUCOSE, UA: 150 mg/dL — AB
HGB URINE DIPSTICK: NEGATIVE
Ketones, ur: NEGATIVE mg/dL
LEUKOCYTES UA: NEGATIVE
NITRITE: NEGATIVE
PH: 6 (ref 5.0–8.0)
Protein, ur: NEGATIVE mg/dL
SPECIFIC GRAVITY, URINE: 1.014 (ref 1.005–1.030)

## 2017-11-12 LAB — BASIC METABOLIC PANEL
ANION GAP: 8 (ref 5–15)
BUN: 13 mg/dL (ref 8–23)
CO2: 29 mmol/L (ref 22–32)
CREATININE: 1.04 mg/dL — AB (ref 0.44–1.00)
Calcium: 9.5 mg/dL (ref 8.9–10.3)
Chloride: 105 mmol/L (ref 98–111)
GFR calc non Af Amer: 55 mL/min — ABNORMAL LOW (ref >60)
Glucose, Bld: 213 mg/dL — ABNORMAL HIGH (ref 70–99)
Potassium: 4.1 mmol/L (ref 3.5–5.1)
SODIUM: 142 mmol/L (ref 135–145)

## 2017-11-12 LAB — CREATININE, SERUM
Creatinine, Ser: 0.73 mg/dL (ref 0.44–1.00)
GFR calc Af Amer: >60 mL/min (ref >60)

## 2017-11-12 LAB — CBC
HEMATOCRIT: 42.2 % (ref 36.0–46.0)
Hemoglobin: 13.6 g/dL (ref 12.0–15.0)
MCH: 30.2 pg (ref 26.0–34.0)
MCHC: 32.2 g/dL (ref 30.0–36.0)
MCV: 93.8 fL (ref 80.0–100.0)
Platelets: 207 10*3/uL (ref 150–400)
RBC: 4.5 MIL/uL (ref 3.87–5.11)
RDW: 13 % (ref 11.5–15.5)
WBC: 9.2 10*3/uL (ref 4.0–10.5)
nRBC: 0 % (ref 0.0–0.2)

## 2017-11-12 IMAGING — MR MR HEAD W/O CM
11 of 12 series · 45 of 48 positions shown · non-contrast
Comparison: None.

Addendum:
CLINICAL DATA: Right lower extremity weakness, abnormal cerebellar
testing, focal neuro deficit, greater than 6 hours.

EXAM:
MRI HEAD WITHOUT CONTRAST
TECHNIQUE: Multiplanar, multiecho pulse sequences of the brain and surrounding
structures were obtained without intravenous contrast.

[Series 2: T1 · sagittal · 5.0mm · 0.94mm/px · 2 of 23 slices shown (1 of 2)]
[im 1/23]
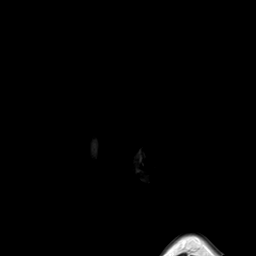
[im 23/23]
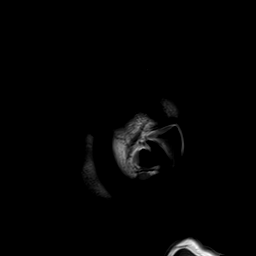

[Series 3: ax dwi_tracew · axial · 3.0mm · 0.83mm/px · z∈[-68,+94]mm · 6 of 55 slices shown]
[im 1/55]
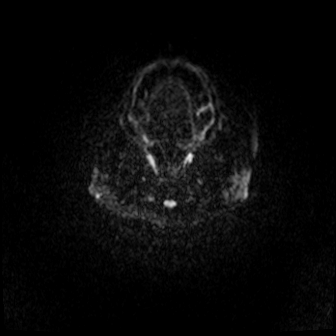
[im 11/55]
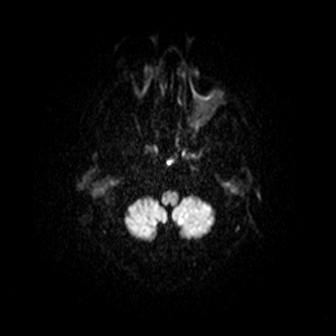
[im 22/55]
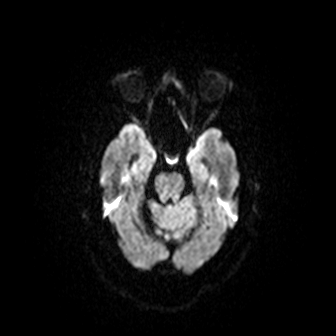
[im 33/55]
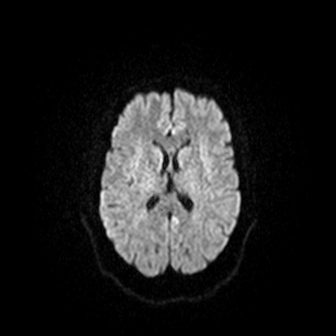
[im 44/55]
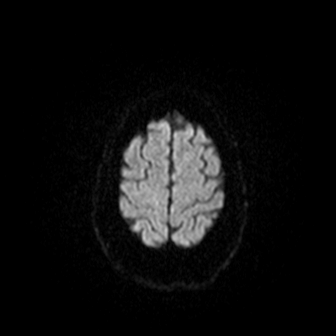
[im 55/55]
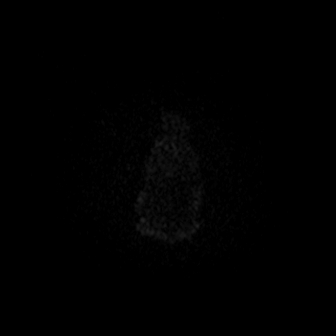

[Series 4: ax dwi_adc · axial · 3.0mm · 0.83mm/px · z∈[-68,+94]mm · 6 of 55 slices shown]
[im 1/55]
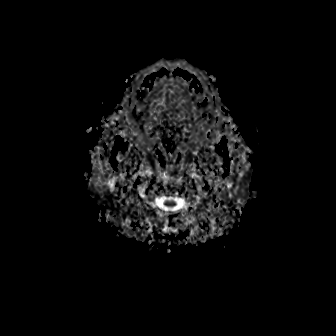
[im 11/55]
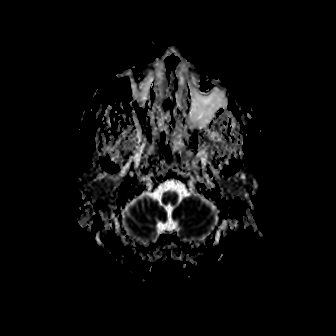
[im 22/55]
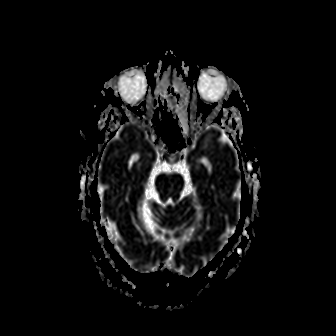
[im 33/55]
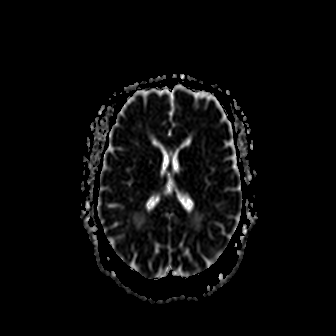
[im 44/55]
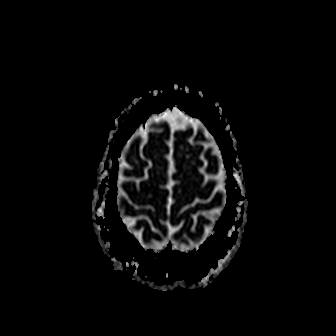
[im 55/55]
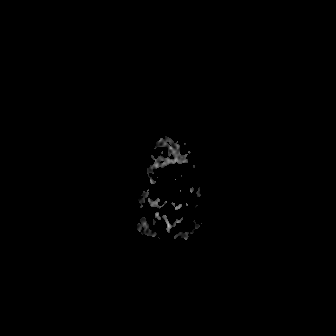

[Series 5: cor dwi_tracew · coronal · 5.0mm · 0.68mm/px · 4 of 41 slices shown]
[im 1/41]
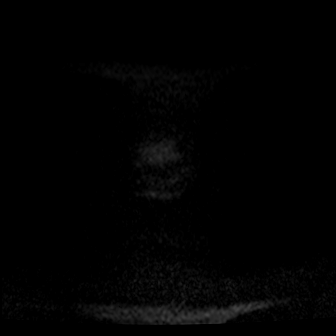
[im 14/41]
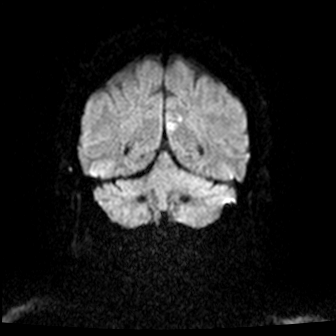
[im 27/41]
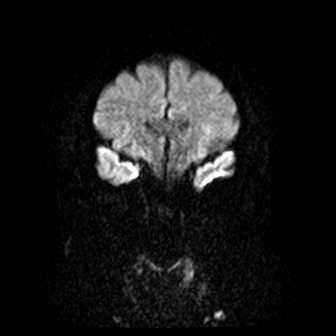
[im 41/41]
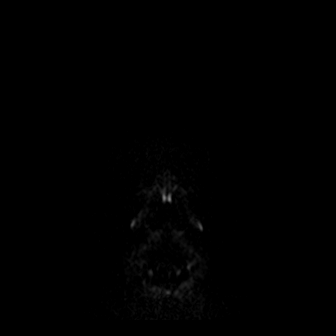

[Series 6: cor dwi_adc · coronal · 5.0mm · 0.68mm/px · 4 of 41 slices shown]
[im 1/41]
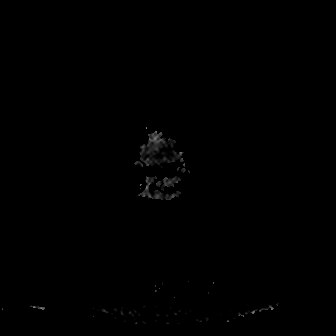
[im 14/41]
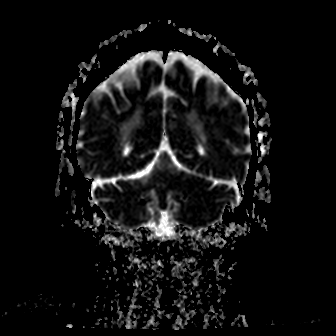
[im 27/41]
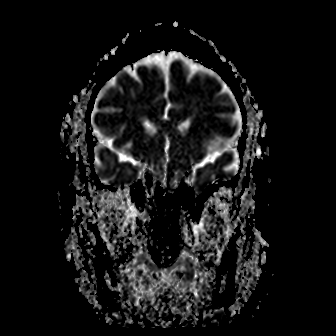
[im 41/41]
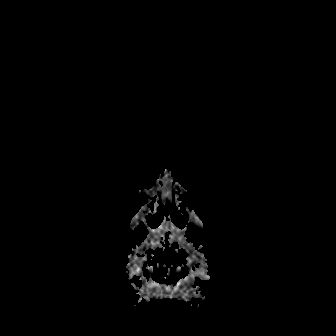

[Series 7: swi_images · axial · 3.0mm · 0.90mm/px · z∈[-77,+100]mm · 6 of 60 slices shown]
[im 1/60]
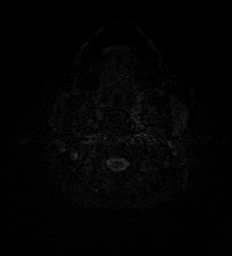
[im 12/60]
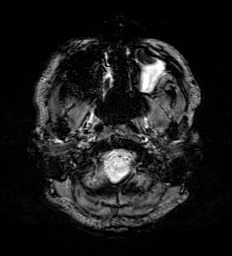
[im 24/60]
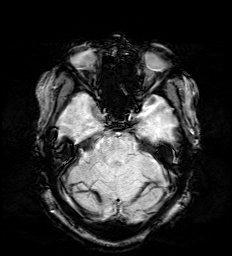
[im 36/60]
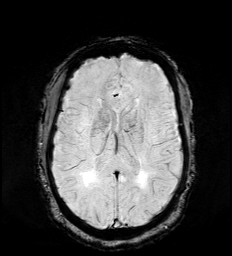
[im 48/60]
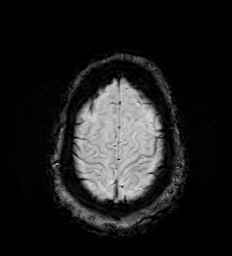
[im 60/60]
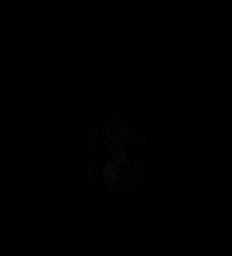

[Series 8: mip_images(sw) · axial · 24.0mm · 0.90mm/px · z∈[-66,+90]mm · 5 of 53 slices shown]
[im 1/53]
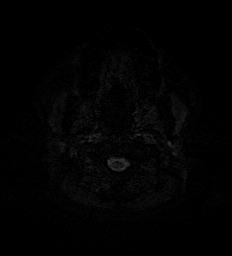
[im 14/53]
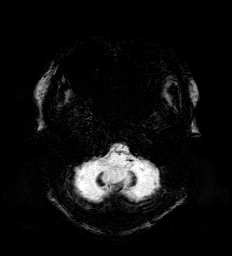
[im 27/53]
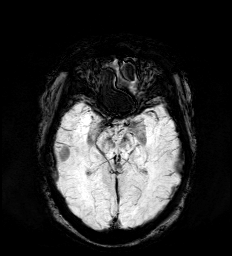
[im 40/53]
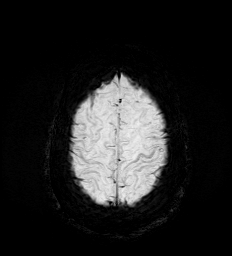
[im 53/53]
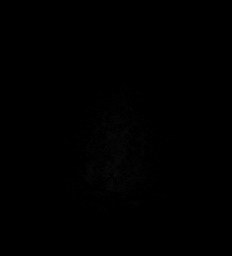

[Series 9: FLAIR · axial · 5.0mm · 1.20mm/px · z∈[-66,+90]mm · 3 of 27 slices shown]
[im 1/27]
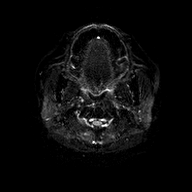
[im 14/27]
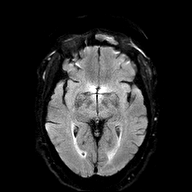
[im 27/27]
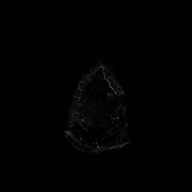

[Series 11: T2 · axial · 5.0mm · 0.45mm/px · z∈[-66,+90]mm · 3 of 27 slices shown (1 of 2)]
[im 1/27]
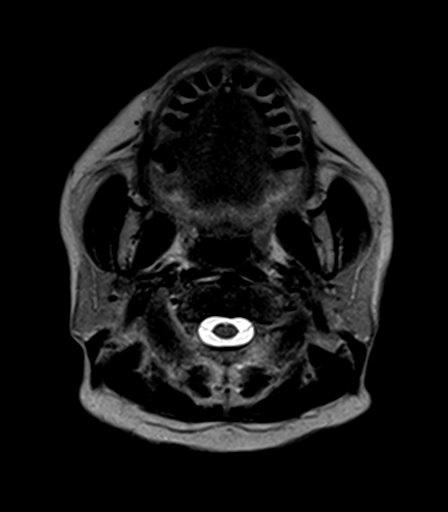
[im 14/27]
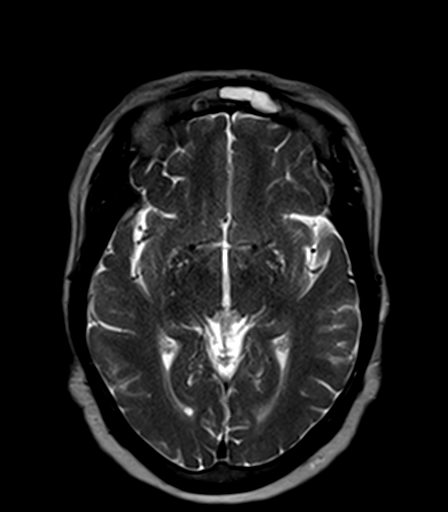
[im 27/27]
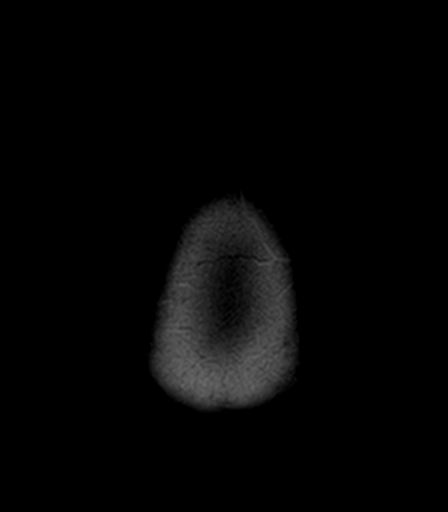

[Series 12: T1 · axial · 5.0mm · 0.90mm/px · z∈[-66,+90]mm · 3 of 27 slices shown (2 of 2)]
[im 1/27]
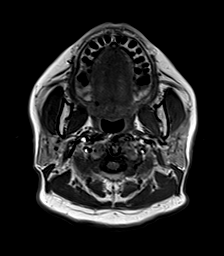
[im 14/27]
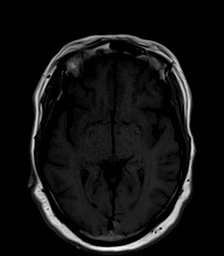
[im 27/27]
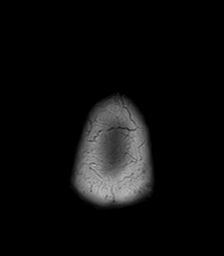

[Series 13: T2 · coronal · 5.0mm · 0.45mm/px · 3 of 31 slices shown (2 of 2)]
[im 1/31]
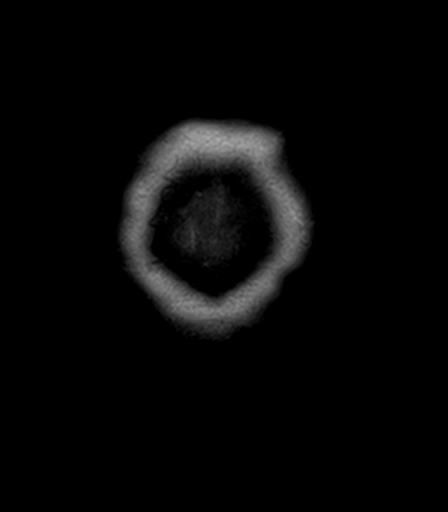
[im 16/31]
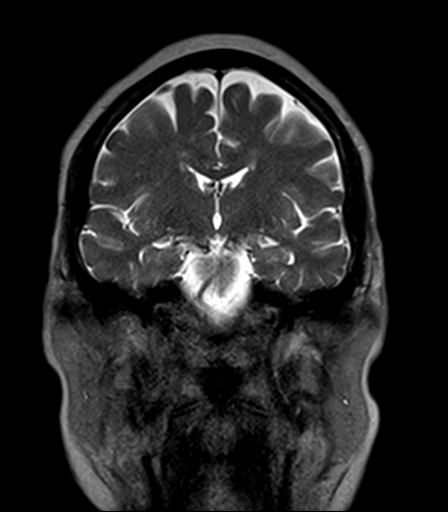
[im 31/31]
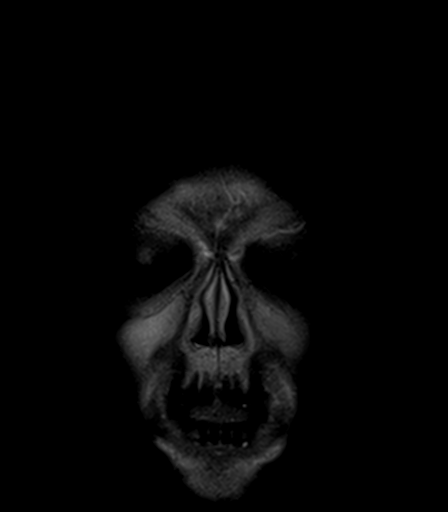

[45 of 48 positions shown; findings below may reference images not displayed]

FINDINGS: Brain: Subtle diffusion signal changes are present in the
anteromedial frontal lobes bilaterally. There is also abnormal
signal on the diffusion trace images in the posteromedial left
frontal lobe, distal ACA territory. This mostly represents T2 shine
through without significant restricted diffusion on the ADC maps.

Additional periventricular white matter changes bilaterally are
moderately advanced for age. Ventricles are of normal size. Basal
ganglia are within normal limits. The brainstem and cerebellum are
normal.

Vascular: Flow is present in the major intracranial arteries.

Skull and upper cervical spine: Skull base is within normal limits.
Craniocervical junction is normal.

Sinuses/Orbits: The left maxillary sinus is near completely
opacified. Mucosal thickening is present in the right maxillary
sinus inferiorly. Nasal polyps are evident without nasal
obstruction. Scattered ethmoid opacification is worse on the left.
The left frontal sinus is opacified. The sphenoid sinuses are clear.
Mastoid air cells are clear. Globes and orbits are within normal
limits.
IMPRESSION: 1. Diffusion signal abnormality in the distal left ACA territory.
Abnormal signal in the posteromedial left frontal lobe is consistent
with a subacute to chronic infarct. This corresponds to right lower
extremity weakness.
2. Subtle anteromedial diffusion signal abnormality bilaterally may
also reflect some degree of ischemia.
3. Diffuse white matter disease is moderately advanced for age. This
likely reflects the sequela of chronic microvascular ischemia.

ADDENDUM:
The multilocular cystic lesion in the left posterior pericallosal
brain is stable in comparison with the prior

MRIs of the head dated 09/28/2010, 05/05/2007, 11/23/2004, and
07/26/2003.

  By: Ernestina Ghuman M.D.

*** End of Addendum ***

## 2017-11-12 IMAGING — MR MR MRA HEAD W/O CM
1 series · 20 of 48 positions shown · non-contrast
Comparison: 11/12/2017 MRI of the head.

CLINICAL DATA: 67 y/o  F; stroke for follow-up.

EXAM:
MRA HEAD WITHOUT CONTRAST
TECHNIQUE: Angiographic images of the Circle of Willis were obtained using MRA
technique without intravenous contrast.

[Series 5: TOF · axial · 0.5mm · 0.41mm/px · z∈[-29,+67]mm · 20 of 205 slices shown]
[im 1/205]
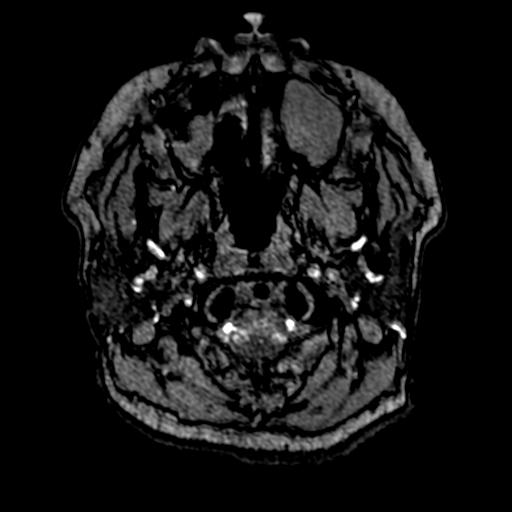
[im 5/205]
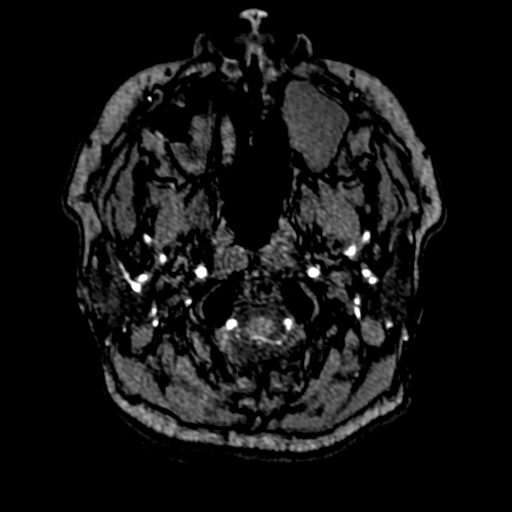
[im 9/205]
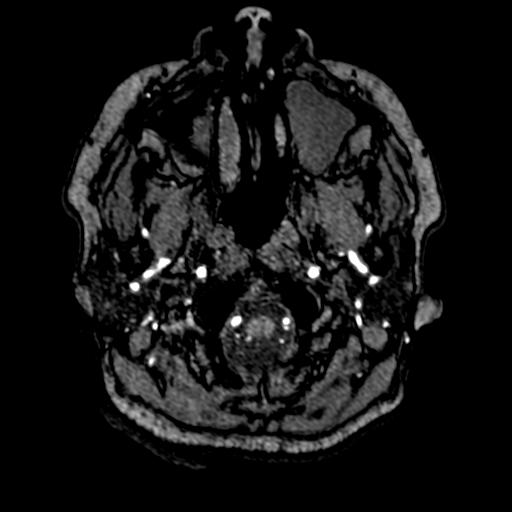
[im 14/205]
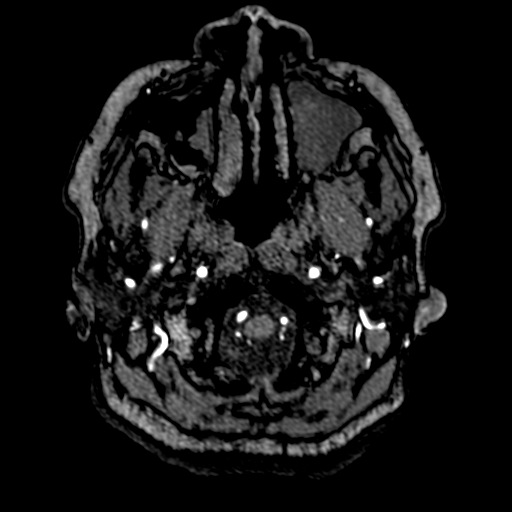
[im 18/205]
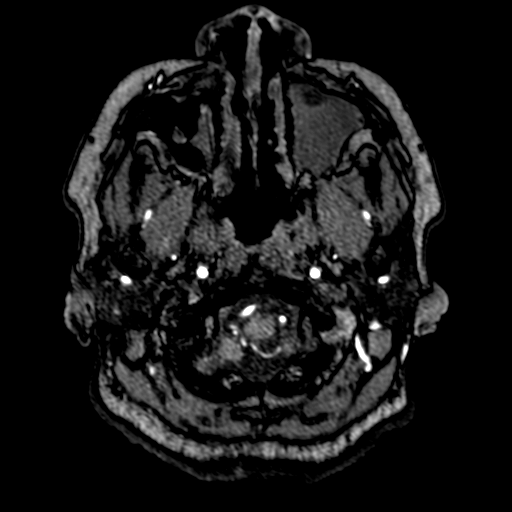
[im 22/205]
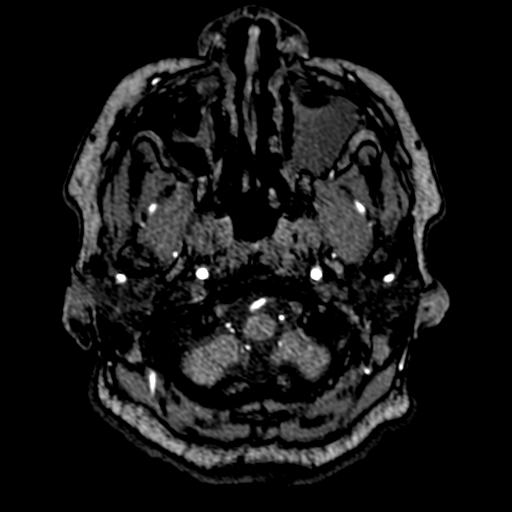
[im 27/205]
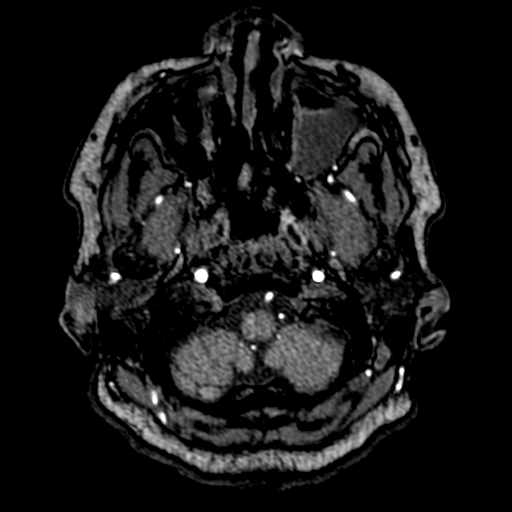
[im 31/205]
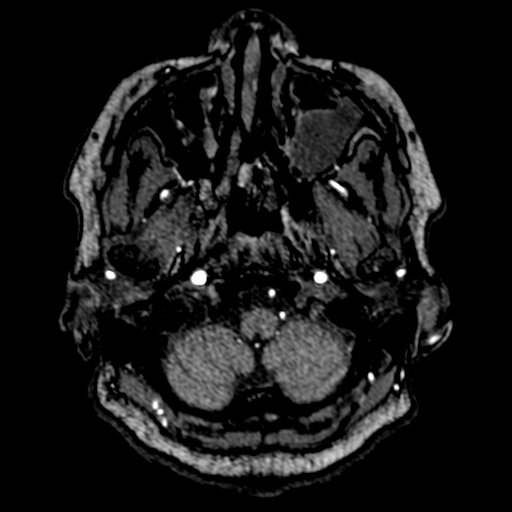
[im 35/205]
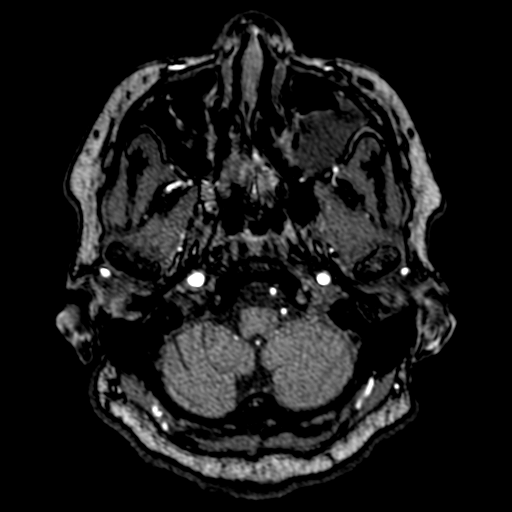
[im 40/205]
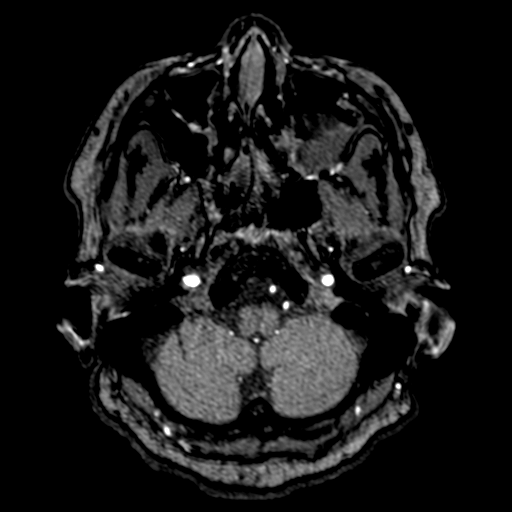
[im 44/205]
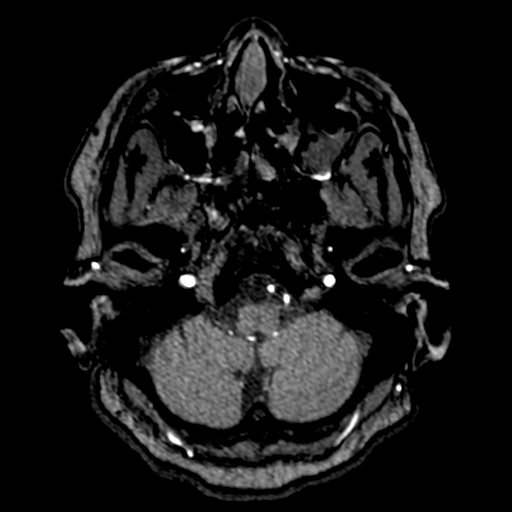
[im 48/205]
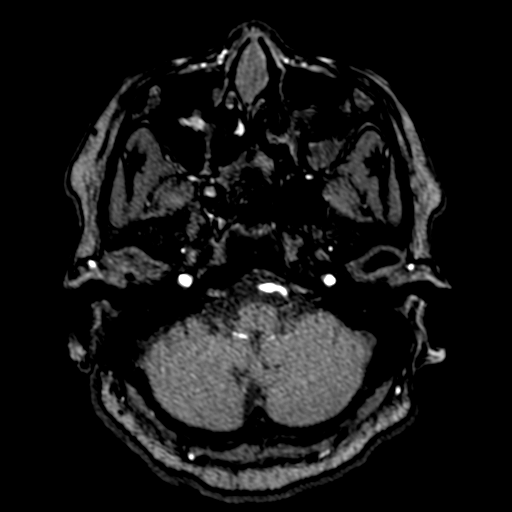
[im 66/205]
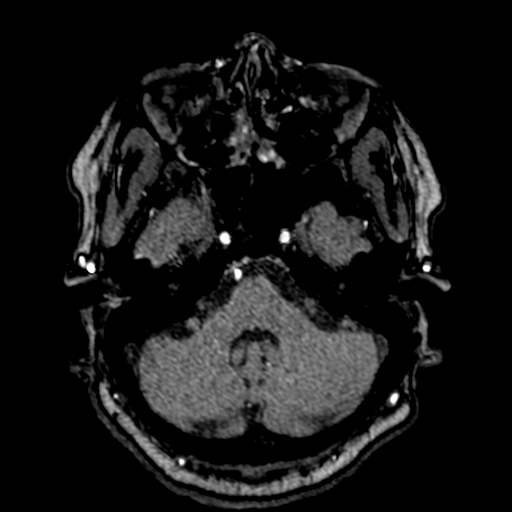
[im 92/205]
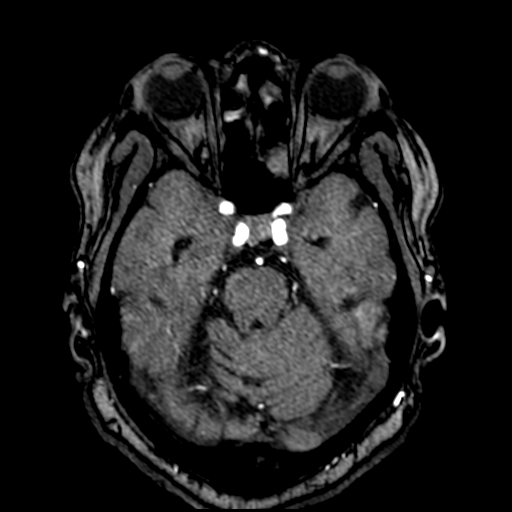
[im 105/205]
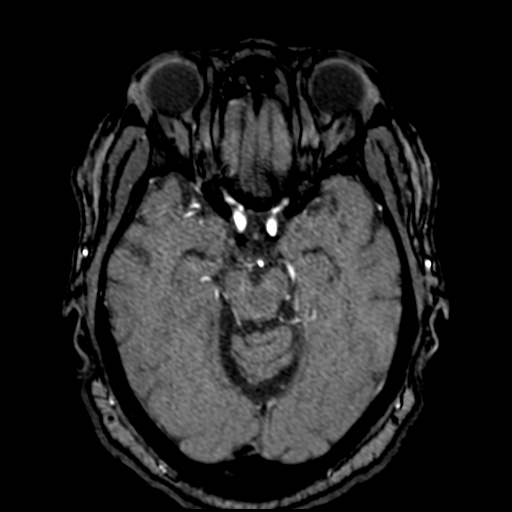
[im 118/205]
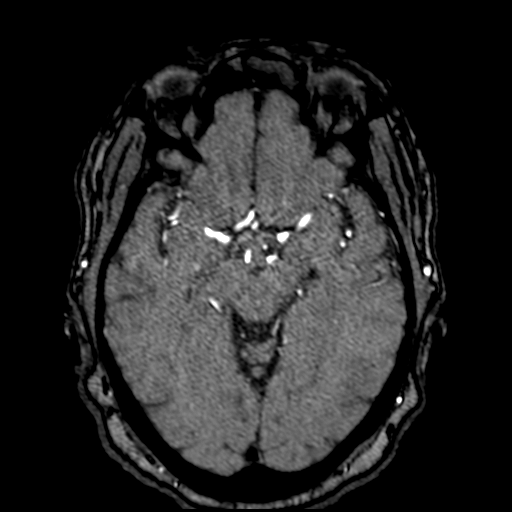
[im 144/205]
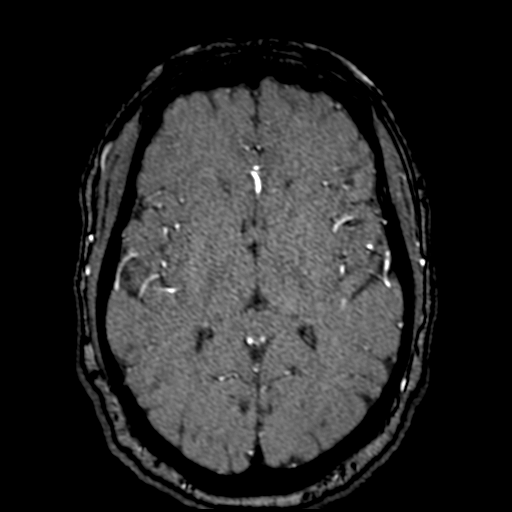
[im 170/205]
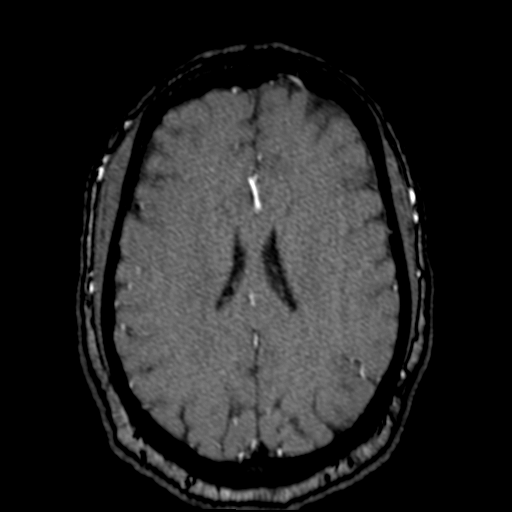
[im 174/205]
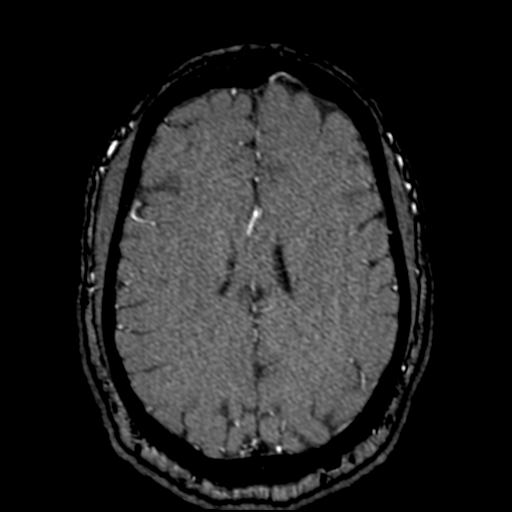
[im 196/205]
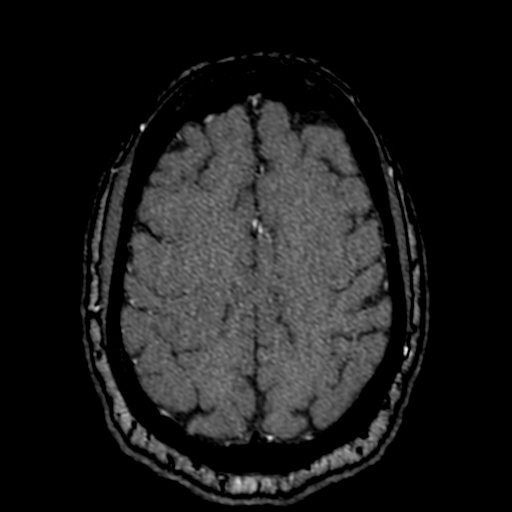

[20 of 48 positions shown; findings below may reference images not displayed]

FINDINGS: Internal carotid arteries:  Patent.

Anterior cerebral arteries:  Patent.

Middle cerebral arteries: Patent.

Anterior communicating artery: Patent.

Posterior communicating arteries:  Patent.  Fetal right PCA.

Posterior cerebral arteries:  Patent.

Basilar artery:  Patent.

Vertebral arteries:  Patent.

No evidence of significant stenosis, large vessel occlusion, or
aneurysm.
IMPRESSION: No large vessel occlusion, aneurysm, or significant stenosis is
identified.

## 2017-11-12 IMAGING — CT CT HEAD W/O CM
3 series · 16 of 46 positions shown, 19 images · non-contrast
Comparison: MR brain 09/28/2010

CLINICAL DATA: Neuro deficit, subacute, RIGHT leg weakness, recent
falls, history hypertension, diabetes mellitus

EXAM:
CT HEAD WITHOUT CONTRAST
TECHNIQUE: Contiguous axial images were obtained from the base of the skull
through the vertex without intravenous contrast. Sagittal and
coronal MPR images reconstructed from axial data set.

[Series 3: head wo · axial · 0.40mm/px · z∈[-163,-43]mm · 10 of 29 slices shown, 13 images]
[im 3/29  brain]
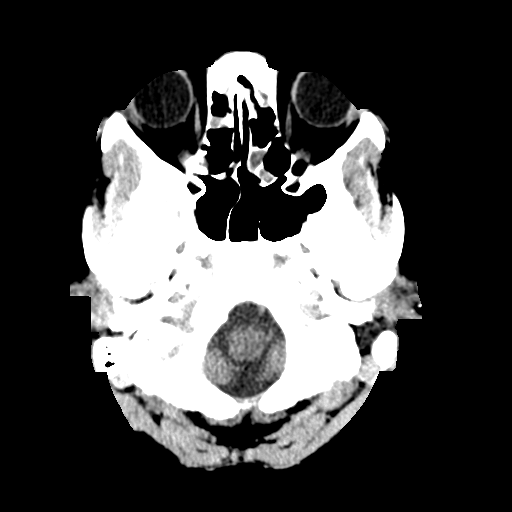
[im 3/29  bone]
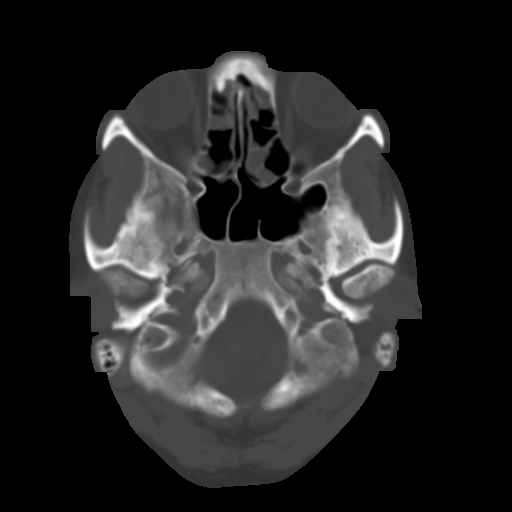
[im 6/29  brain]
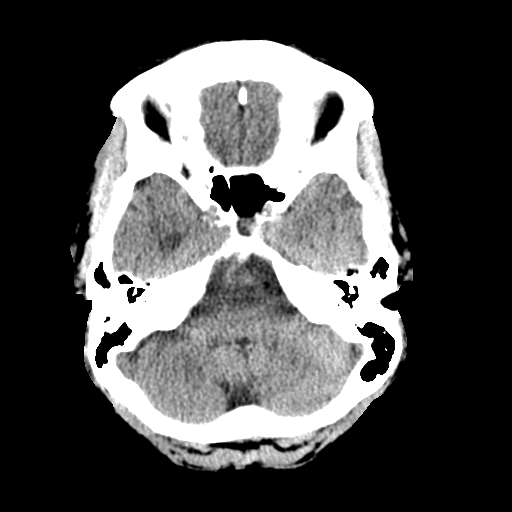
[im 8/29  brain]
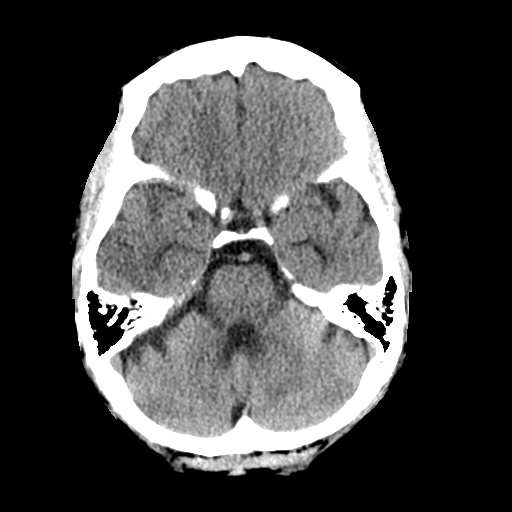
[im 11/29  brain]
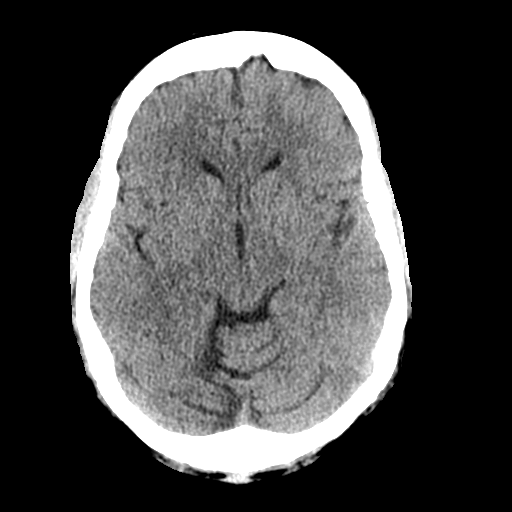
[im 14/29  brain]
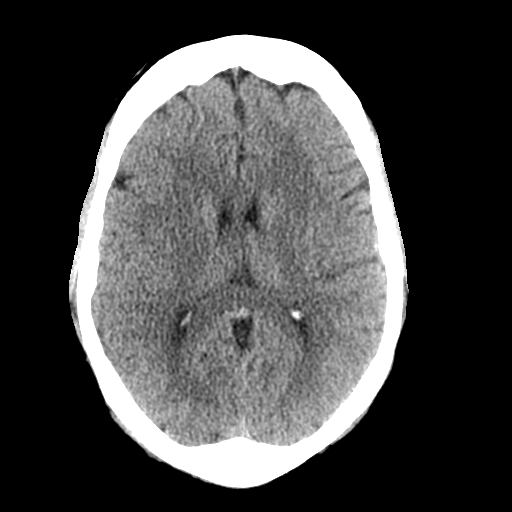
[im 14/29  bone]
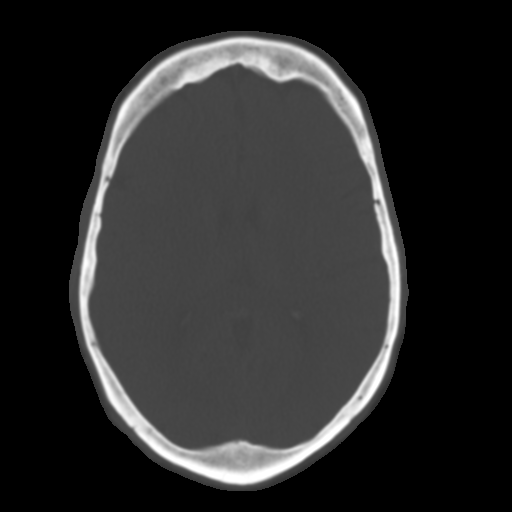
[im 16/29  brain]
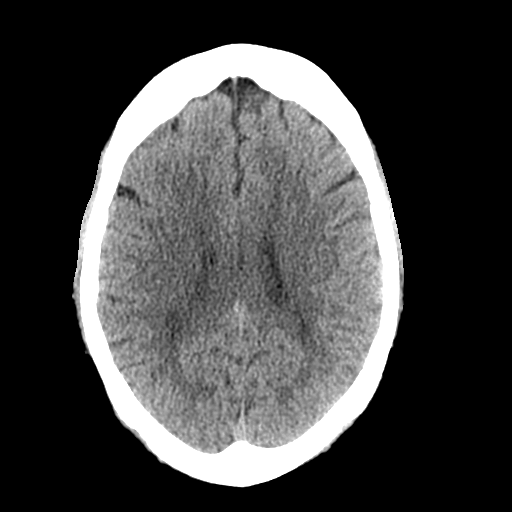
[im 19/29  brain]
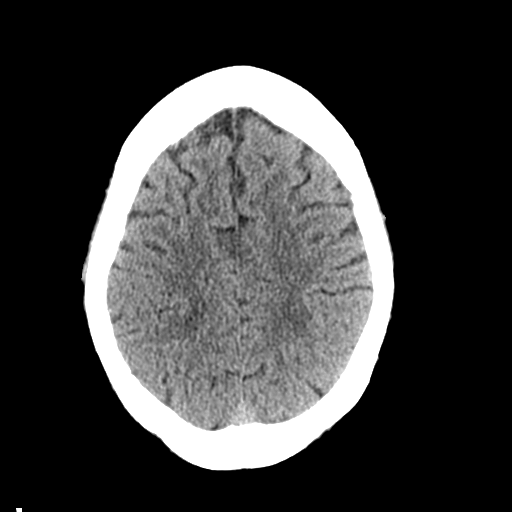
[im 22/29  brain]
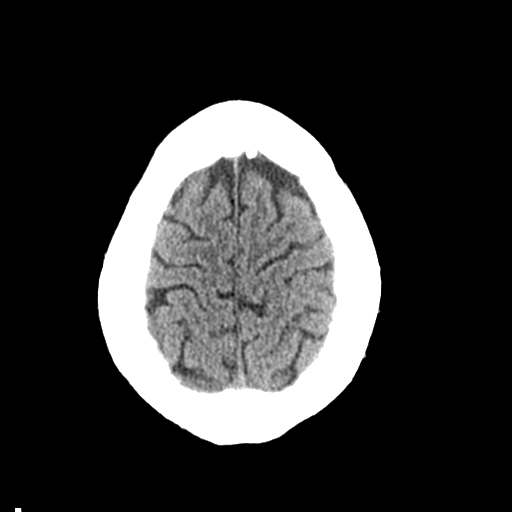
[im 24/29  brain]
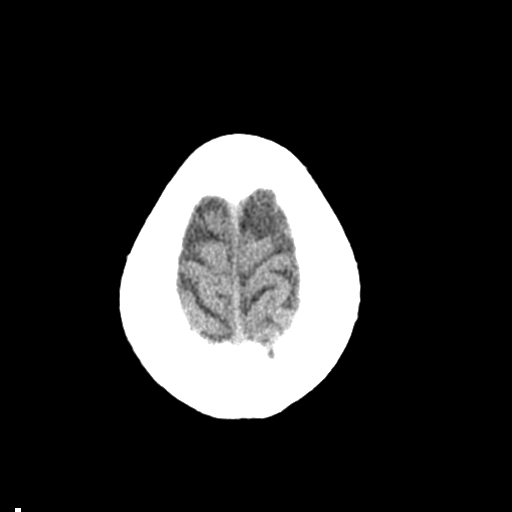
[im 24/29  bone]
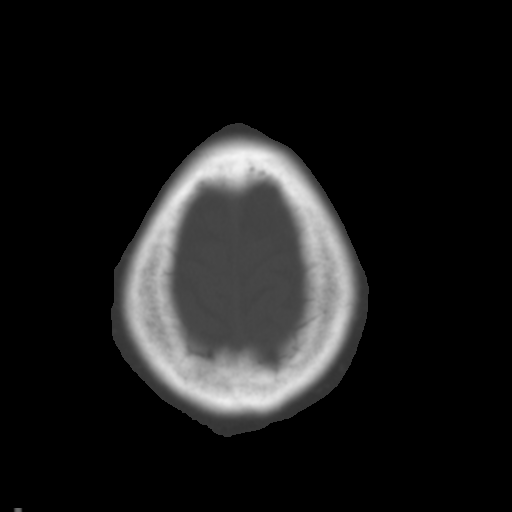
[im 27/29  brain]
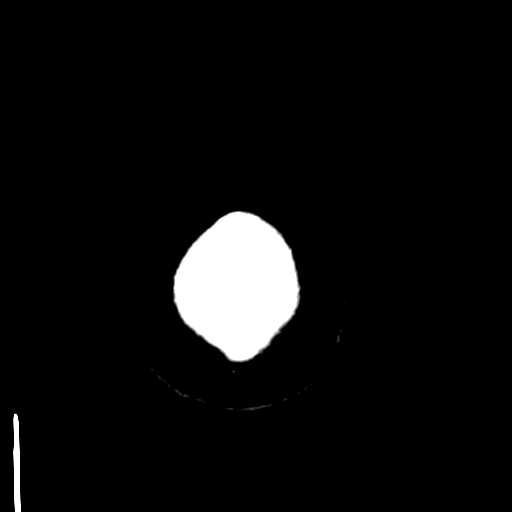

[Series 4: coronal soft tissue · coronal · 0.31mm/px · 3 of 64 slices shown]
[im 22/64  brain]
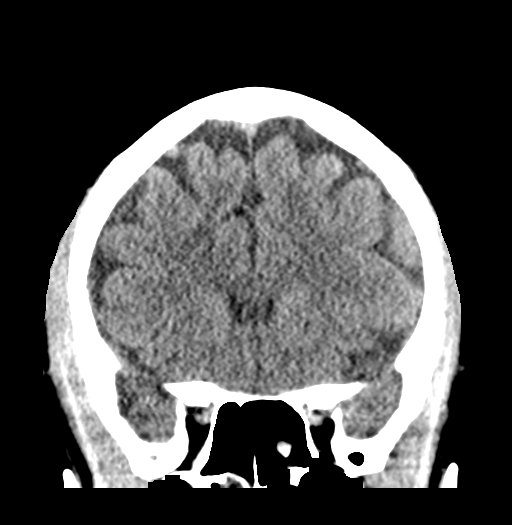
[im 29/64  brain]
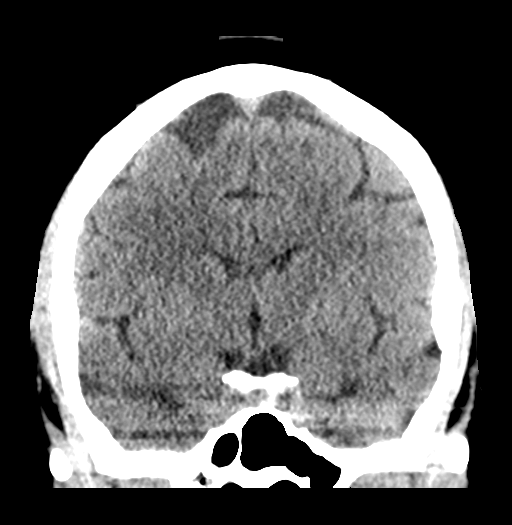
[im 36/64  brain]
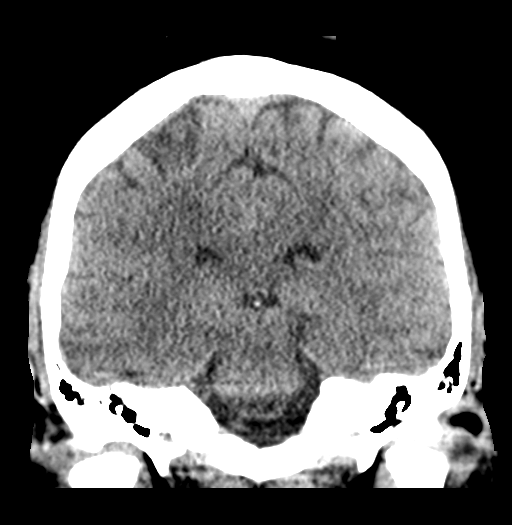

[Series 5: sagittal soft tissue · sagittal · 0.30mm/px · 3 of 52 slices shown]
[im 18/52  brain]
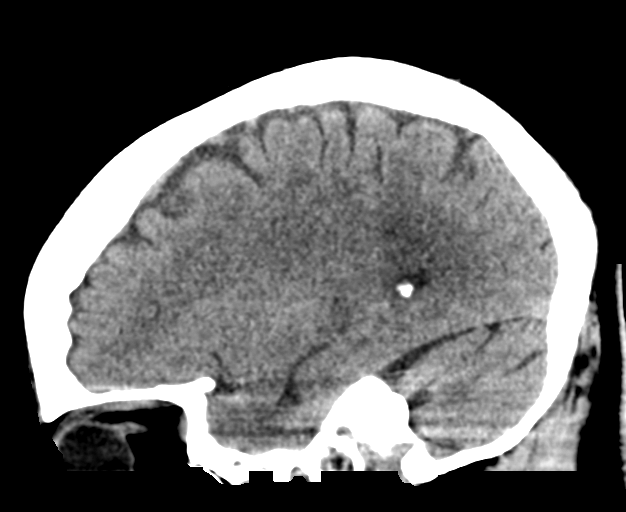
[im 26/52  brain]
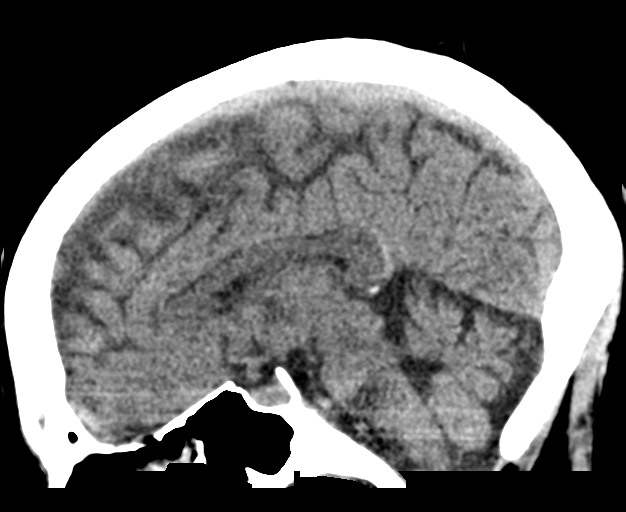
[im 35/52  brain]
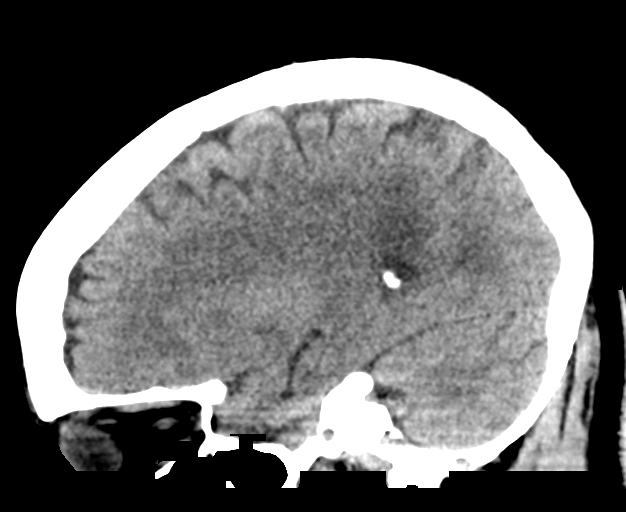

[16 of 46 positions shown; findings below may reference images not displayed]

FINDINGS: Brain: Normal ventricular morphology. No midline shift or mass
effect. Mild small vessel chronic ischemic changes of deep cerebral
white matter. No intracranial hemorrhage, mass lesion, or evidence
of acute infarction. No extra-axial fluid collections.

Vascular: Atherosclerotic calcification of internal carotid arteries
bilaterally at skull base. No hyperdense vessels.

Skull: Intact

Sinuses/Orbits: Mucosal thickening throughout the ethmoid air cells
and maxillary sinus apices.

Other: N/A
IMPRESSION: No acute intracranial abnormalities.

Mild small vessel chronic ischemic changes of deep cerebral white
matter.

## 2017-11-12 MED ORDER — ALOGLIPTIN BENZOATE 25 MG PO TABS
1.0000 | ORAL_TABLET | Freq: Every day | ORAL | Status: DC
  Filled 2017-11-12: qty 1

## 2017-11-12 MED ORDER — ASPIRIN EC 81 MG PO TBEC
81.0000 mg | DELAYED_RELEASE_TABLET | Freq: Every day | ORAL | Status: DC
  Administered 2017-11-13: 08:00:00 81 mg via ORAL
  Filled 2017-11-12: qty 1

## 2017-11-12 MED ORDER — ACETAMINOPHEN 160 MG/5ML PO SOLN
650.0000 mg | ORAL | Status: DC | PRN
  Filled 2017-11-12: qty 20.3

## 2017-11-12 MED ORDER — PANTOPRAZOLE SODIUM 40 MG PO TBEC
40.0000 mg | DELAYED_RELEASE_TABLET | Freq: Every day | ORAL | Status: DC
  Administered 2017-11-13: 40 mg via ORAL
  Filled 2017-11-12: qty 1

## 2017-11-12 MED ORDER — LEVOTHYROXINE SODIUM 50 MCG PO TABS
50.0000 ug | ORAL_TABLET | Freq: Every day | ORAL | Status: DC
  Administered 2017-11-13: 50 ug via ORAL
  Filled 2017-11-12: qty 1

## 2017-11-12 MED ORDER — ACETAMINOPHEN 325 MG PO TABS: 650.0000 mg | ORAL_TABLET | ORAL | Status: DC | PRN

## 2017-11-12 MED ORDER — LINAGLIPTIN 5 MG PO TABS
5.0000 mg | ORAL_TABLET | Freq: Every day | ORAL | Status: DC
  Administered 2017-11-13: 08:00:00 5 mg via ORAL
  Filled 2017-11-12 (×2): qty 1

## 2017-11-12 MED ORDER — ENOXAPARIN SODIUM 40 MG/0.4ML ~~LOC~~ SOLN: 40.0000 mg | SUBCUTANEOUS | Status: DC

## 2017-11-12 MED ORDER — ASPIRIN 81 MG PO CHEW
324.0000 mg | CHEWABLE_TABLET | Freq: Once | ORAL | Status: AC
  Administered 2017-11-12: 324 mg via ORAL
  Filled 2017-11-12: qty 4

## 2017-11-12 MED ORDER — EZETIMIBE-SIMVASTATIN 10-40 MG PO TABS: 1.0000 | ORAL_TABLET | Freq: Every day | ORAL | Status: DC

## 2017-11-12 MED ORDER — DIAZEPAM 5 MG PO TABS
10.0000 mg | ORAL_TABLET | Freq: Once | ORAL | Status: AC
  Administered 2017-11-12: 10 mg via ORAL
  Filled 2017-11-12: qty 2

## 2017-11-12 MED ORDER — METFORMIN HCL 500 MG PO TABS
1000.0000 mg | ORAL_TABLET | Freq: Two times a day (BID) | ORAL | Status: DC
  Administered 2017-11-13 (×2): 1000 mg via ORAL
  Filled 2017-11-12 (×3): qty 2

## 2017-11-12 MED ORDER — ACETAMINOPHEN 650 MG RE SUPP: 650.0000 mg | RECTAL | Status: DC | PRN

## 2017-11-12 MED ORDER — INFLUENZA VAC SPLIT HIGH-DOSE 0.5 ML IM SUSY
0.5000 mL | PREFILLED_SYRINGE | INTRAMUSCULAR | Status: AC
  Administered 2017-11-13: 0.5 mL via INTRAMUSCULAR
  Filled 2017-11-12: qty 0.5

## 2017-11-12 MED ORDER — EZETIMIBE 10 MG PO TABS
10.0000 mg | ORAL_TABLET | Freq: Every day | ORAL | Status: DC
  Filled 2017-11-12 (×2): qty 1

## 2017-11-12 MED ORDER — STROKE: EARLY STAGES OF RECOVERY BOOK
Freq: Once | Status: AC
  Administered 2017-11-12: 21:00:00

## 2017-11-12 MED ORDER — ENOXAPARIN SODIUM 40 MG/0.4ML ~~LOC~~ SOLN: 30.0000 mg | SUBCUTANEOUS | Status: DC

## 2017-11-12 MED ORDER — SIMVASTATIN 20 MG PO TABS
40.0000 mg | ORAL_TABLET | Freq: Every day | ORAL | Status: DC
  Administered 2017-11-12: 23:00:00 40 mg via ORAL
  Filled 2017-11-12: qty 2

## 2017-11-12 NOTE — ED Notes (Signed)
Patient transported to MRI 

## 2017-11-12 NOTE — ED Notes (Signed)
This RN attempted to call report to floor. Primary RN unassigned and charge RN unavailable. Was informed that charge RN would return call as soon as able.

## 2017-11-12 NOTE — ED Notes (Signed)
Attempted to call report to floor, primary RN unavailable.

## 2017-11-12 NOTE — ED Notes (Signed)
First Nurse Note: Patient complaining of right leg "dragging" X 2 weeks, recent hx of 2 falls, the last time was this AM.  Alert and oriented.  States she has seen her PCP for same.

## 2017-11-12 NOTE — ED Triage Notes (Signed)
Patient states she has noticed right leg weakness, "my right side has always been weaker".  Saw PCP - Dr. Delfina Redwood on Friday for same.  Has been falling X 2 recently, Friday and again today.  Alert and oriented, color good.  Grip strength equal, facial features symmetrical.

## 2017-11-12 NOTE — H&P (Signed)
Ann Weiss NAME: Ann Weiss    MR#:  [id number]  DATE OF BIRTH:  Nov 08, 1950  DATE OF ADMISSION:  11/12/2017  PRIMARY CARE PHYSICIAN: Seward Carol, MD   REQUESTING/REFERRING PHYSICIAN: Joni Fears  CHIEF COMPLAINT: Left leg weakness   Chief Complaint  Patient presents with  . Weakness  . Fall    HISTORY OF PRESENT ILLNESS:  Ann Weiss  is a 67 y.o. female with a known history of attention, diabetes mellitus type 2, hypothyroidism, hyperlipidemia has been having left leg weakness for 2 weeks went to PCP weeks ago because of dragging of left leg which started that time.  Patient had a fall at PCP office because PCP was doing her neuro exam and asked her to walk on her toes.  Since then she did have a fall.  She went to work today and suddenly stumbled over.  So she was brought in here for evaluation.  Patient had MRI of the brain which showed subacute left ACA stroke.  Denies any slurred speech.  No blurred vision or headache.  No weakness on right upper extremity.  Patient main complaint is dragging of the right leg and denies any complaints other than that.  Denies any weakness on the left side .  Denies any tingling, numbness of extremities.  PAST MEDICAL HISTORY:   Past Medical History:  Diagnosis Date  . Constipation   . Diabetes mellitus   . GERD (gastroesophageal reflux disease)   . Goiter   . Hyperlipidemia   . Hypertension   . Hypothyroidism   . Premature menopause age 75  . Vitamin D deficiency 2009    PAST SURGICAL HISTOIRY:   Past Surgical History:  Procedure Laterality Date  . CESAREAN SECTION     x 2  . COLONOSCOPY  [id number]  . ESOPHAGOGASTRODUODENOSCOPY  [id number]  . FOOT SURGERY Right 01/2013   bone spur  . TUBAL LIGATION      SOCIAL HISTORY:   Social History   Tobacco Use  . Smoking status: Never Smoker  . Smokeless tobacco: Never Used  Substance Use Topics  . Alcohol use: Not  Currently    Comment: socially maybe 1-2 times monthly    FAMILY HISTORY:   Family History  Problem Relation Age of Onset  . Hypertension Mother   . Kidney disease Mother        renal failure, dialysis  . Hypertension Father   . Stroke Father   . Arthritis Maternal Aunt   . Heart disease Maternal Aunt   . Diabetes Maternal Grandmother   . Cancer Neg Hx   . Breast cancer Neg Hx   . Colon cancer Neg Hx     DRUG ALLERGIES:  No Known Allergies  REVIEW OF SYSTEMS:  CONSTITUTIONAL: No fever, fatigue or weakness.  EYES: No blurred or double vision.  EARS, NOSE, AND THROAT: No tinnitus or ear pain.  RESPIRATORY: No cough, shortness of breath, wheezing or hemoptysis.  CARDIOVASCULAR: No chest pain, orthopnea, edema.  GASTROINTESTINAL: No nausea, vomiting, diarrhea or abdominal pain.  GENITOURINARY: No dysuria, hematuria.  ENDOCRINE: No polyuria, nocturia,  HEMATOLOGY: No anemia, easy bruising or bleeding SKIN: No rash or lesion. MUSCULOSKELETAL: No joint pain or arthritis.   NEUROLOGIC: Left leg weakness.  No numbness, tingling.  No facial droop, no slurred speech pSYCHIATRY: No anxiety or depression.   MEDICATIONS AT HOME:   Prior to Admission medications   Medication Sig Start Date End Date  Taking? Authorizing Provider  Alogliptin Benzoate 25 MG TABS Take 1 tablet by mouth daily. 07/20/14   Rita Ohara, MD  amLODipine (NORVASC) 10 MG tablet Take 1 tablet (10 mg total) by mouth daily. 06/27/14   Rita Ohara, MD  aspirin 81 MG tablet Take 81 mg by mouth daily.      [provider]  BAYER CONTOUR TEST test strip TEST BLOOD SUGARS 3 TIMES DAILY 12/27/13   Rita Ohara, MD  cephALEXin (KEFLEX) 500 MG capsule Take 1 capsule (500 mg total) by mouth 4 (four) times daily. 08/03/14   Melony Overly, MD  cholecalciferol (VITAMIN D) 1000 UNITS tablet Take 2,000 Units by mouth daily.    [provider]  ezetimibe-simvastatin (VYTORIN) 10-40 MG per tablet Take 1 tablet by mouth  at bedtime. 06/27/14   Rita Ohara, MD  levothyroxine (SYNTHROID, LEVOTHROID) 50 MCG tablet TAKE 1 TABLET BY MOUTH ONCE DAILY 12/26/14   Rita Ohara, MD  magnesium hydroxide (MILK OF MAGNESIA) 400 MG/5ML suspension Take 5 mLs by mouth daily as needed.    [provider]  metFORMIN (GLUCOPHAGE) 1000 MG tablet take 1 tablet by mouth twice a day with food 06/27/14   Rita Ohara, MD  Multiple Vitamins-Minerals (CENTRUM SILVER PO) Take 1 tablet by mouth daily.      [provider]  naproxen (NAPROSYN) 500 MG tablet Take 1 tablet (500 mg total) by mouth 2 (two) times daily with a meal. Patient not taking: Reported on 06/27/2014 02/09/14   Rita Ohara, MD  olmesartan-hydrochlorothiazide (BENICAR HCT) 40-25 MG per tablet Take 1 tablet by mouth daily. 07/20/14   Rita Ohara, MD  omeprazole (PRILOSEC) 20 MG capsule take 1 capsule by mouth once daily 11/29/13   Rita Ohara, MD  oxyCODONE-acetaminophen (PERCOCET) 5-325 MG tablet Take 1-2 tablets by mouth every 4 (four) hours as needed. 01/05/16   Charlesetta Shanks, MD      VITAL SIGNS:  Blood pressure (!) 168/77, pulse 84, temperature 98.6 F (37 C), temperature source Oral, resp. rate 18, height 5\' 6"  (1.676 m), weight 90.7 kg, SpO2 99 %.  PHYSICAL EXAMINATION:  GENERAL:  67 y.o.-year-old patient lying in the bed with no acute distress.  No facial droop. EYES: Pupils equal, round, reactive to light and accommodation. No scleral icterus. Extraocular muscles intact.  HEENT: Head atraumatic, normocephalic. Oropharynx and nasopharynx clear.  NECK:  Supple, no jugular venous distention. No thyroid enlargement, no tenderness.  LUNGS: Normal breath sounds bilaterally, no wheezing, rales,rhonchi or crepitation. No use of accessory muscles of respiration.  CARDIOVASCULAR: S1, S2 normal. No murmurs, rubs, or gallops.  ABDOMEN: Soft, nontender, nondistended. Bowel sounds present. No organomegaly or mass.  EXTREMITIES: No pedal edema, cyanosis, or clubbing.   NEUROLOGIC: Cranial nerves II through XII are intact. Muscle strength 4/5 in the right leg, 5/5 in left upper and left lower, right upper extremity.. Sensation intact. Gait not checked.   PSYCHIATRIC: The patient is alert and oriented x 3.  SKIN: No obvious rash, lesion, or ulcer.   LABORATORY PANEL:   CBC Recent Labs  Lab 11/12/17 1109  WBC 7.4  HGB 12.9  HCT 39.6  PLT 194   ------------------------------------------------------------------------------------------------------------------  Chemistries  Recent Labs  Lab 11/12/17 1109  NA 142  K 4.1  CL 105  CO2 29  GLUCOSE 213*  BUN 13  CREATININE 1.04*  CALCIUM 9.5   ------------------------------------------------------------------------------------------------------------------  Cardiac Enzymes No results for input(s): TROPONINI in the last 168 hours. ------------------------------------------------------------------------------------------------------------------  RADIOLOGY:  Ct Head  Wo Contrast  Result Date: 11/12/2017 CLINICAL DATA:  Neuro deficit, subacute, RIGHT leg weakness, recent falls, history hypertension, diabetes mellitus EXAM: CT HEAD WITHOUT CONTRAST TECHNIQUE: Contiguous axial images were obtained from the base of the skull through the vertex without intravenous contrast. Sagittal and coronal MPR images reconstructed from axial data set. COMPARISON:  MR brain 09/28/2010 FINDINGS: Brain: Normal ventricular morphology. No midline shift or mass effect. Mild small vessel chronic ischemic changes of deep cerebral white matter. No intracranial hemorrhage, mass lesion, or evidence of acute infarction. No extra-axial fluid collections. Vascular: Atherosclerotic calcification of internal carotid arteries bilaterally at skull base. No hyperdense vessels. Skull: Intact Sinuses/Orbits: Mucosal thickening throughout the ethmoid air cells and maxillary sinus apices. Other: N/A IMPRESSION: No acute intracranial  abnormalities. Mild small vessel chronic ischemic changes of deep cerebral white matter. Electronically Signed   By: Lavonia Dana M.D.   On: 11/12/2017 11:43   Mr Brain Wo Contrast  Result Date: 11/12/2017 CLINICAL DATA:  Right lower extremity weakness, abnormal cerebellar testing, focal neuro deficit, greater than 6 hours. EXAM: MRI HEAD WITHOUT CONTRAST TECHNIQUE: Multiplanar, multiecho pulse sequences of the brain and surrounding structures were obtained without intravenous contrast. COMPARISON:  None. FINDINGS: Brain: Subtle diffusion signal changes are present in the anteromedial frontal lobes bilaterally. There is also abnormal signal on the diffusion trace images in the posteromedial left frontal lobe, distal ACA territory. This mostly represents T2 shine through without significant restricted diffusion on the ADC maps. Additional periventricular white matter changes bilaterally are moderately advanced for age. Ventricles are of normal size. Basal ganglia are within normal limits. The brainstem and cerebellum are normal. Vascular: Flow is present in the major intracranial arteries. Skull and upper cervical spine: Skull base is within normal limits. Craniocervical junction is normal. Sinuses/Orbits: The left maxillary sinus is near completely opacified. Mucosal thickening is present in the right maxillary sinus inferiorly. Nasal polyps are evident without nasal obstruction. Scattered ethmoid opacification is worse on the left. The left frontal sinus is opacified. The sphenoid sinuses are clear. Mastoid air cells are clear. Globes and orbits are within normal limits. IMPRESSION: 1. Diffusion signal abnormality in the distal left ACA territory. Abnormal signal in the posteromedial left frontal lobe is consistent with a subacute to chronic infarct. This corresponds to right lower extremity weakness. 2. Subtle anteromedial diffusion signal abnormality bilaterally may also reflect some degree of ischemia. 3.  Diffuse white matter disease is moderately advanced for age. This likely reflects the sequela of chronic microvascular ischemia. Electronically Signed   By: San Morelle M.D.   On: 11/12/2017 16:05    EKG:   Orders placed or performed during the hospital encounter of 11/12/17  . ED EKG  . ED EKG    IMPRESSION AND PLAN:   67 year old female patient with the risk factors of hypertension, diabetes, hypothyroidism, hyperlipidemia having a right leg weakness for 2 weeks, MRI of the brain showed subacute stroke in left ACA.  Assessment and plan Left ICA/CCA stroke Likely thrombotic stroke, , risk factors of hypertension, diabetes mellitus, hyperlipidemia patient is on aspirin at home but she told me she does not take every day.  Continue aspirin 81 mg daily, statins, do full stroke work-up including echocardiogram with bubble study, MRA of carotids, physical therapy and speech therapy evaluation, neurology evaluation.  Continue neuro checks, follow NIH stroke scale. 2.  Diabetes mellitus type 2: Patient can be continued on her home dose metformin if she passes swallow evaluation and starts on the  diet.  She has no swallowing difficulty and hopefully she will pass a swallow test. 3.  Essential hypertension, Benicar, amlodipine.  All the records are reviewed and case discussed with ED provider. Management plans discussed with the patient, family and they are in agreement.  CODE STATUS: Full code  TOTAL TIME TAKING CARE OF THIS PATIENT: 55 minutes.   disCussed with patient's daughter. Epifanio Lesches M.D on 11/12/2017 at 5:39 PM  Between 7am to 6pm - Pager - [phone]  After 6pm go to [email or web address] - password [email or web address] [facility]  Office  [phone]  CC: Primary care physician; Seward Carol, MD  Note: This dictation was prepared with Dragon dictation along with smaller phrase technology. Any transcriptional errors that result from this process are  unintentional.

## 2017-11-12 NOTE — ED Notes (Signed)
Pt ambulated to hallway bathroom to void

## 2017-11-12 NOTE — Progress Notes (Signed)
Anticoagulation monitoring(Lovenox):  67yo  F ordered Lovenox 30 mg Q24h  Filed Weights   11/12/17 1040  Weight: 200 lb (90.7 kg)   BMI 32.3   Lab Results  Component Value Date   CREATININE 1.04 (H) 11/12/2017   CREATININE 0.95 01/05/2016   CREATININE 0.75 11/29/2013   Estimated Creatinine Clearance: 60.4 mL/min (A) (by C-G formula based on SCr of 1.04 mg/dL (H)). Hemoglobin & Hematocrit     Component Value Date/Time   HGB 12.9 11/12/2017 1109   HCT 39.6 11/12/2017 1109     Per Protocol for Patient with estCrcl > 30 ml/min and BMI < 40, will transition to Lovenox 40 mg Q24h      Chinita Greenland PharmD Clinical Pharmacist 11/12/2017

## 2017-11-12 NOTE — ED Provider Notes (Signed)
[facility] Emergency Department Provider Note  ____________________________________________  Time seen: Approximately 1:51 PM  I have reviewed the triage vital signs and the nursing notes.   HISTORY  Chief Complaint Weakness and Fall    HPI Ann Weiss is a 67 y.o. female with a history of diabetes, hypertension, hyperlipidemia and reported chronic right-sided weakness who complains of a fall today.  She reports that over the past 2 weeks she is had worsening right lower extremity weakness causing her to drag the foot as she walks.  This caused her to trip over a rug at her office, hitting her head on the wall before falling to the ground.  She denies loss of consciousness or neck pain.  No vision changes paresthesias or worsened weakness.  She has occipital headache and pain in the left shoulder that started after the fall..   Constant nonradiating no aggravating or alleviating factors  She saw her doctor recently and had a fall in the clinic during gait testing, and is awaiting an outpatient MRI.     Past Medical History:  Diagnosis Date  . Constipation   . Diabetes mellitus   . GERD (gastroesophageal reflux disease)   . Goiter   . Hyperlipidemia   . Hypertension   . Hypothyroidism   . Premature menopause age 4  . Vitamin D deficiency 2009     Patient Active Problem List   Diagnosis Date Noted  . Acute ischemic left ACA stroke ([place]) 11/12/2017  . GERD (gastroesophageal reflux disease) 11/23/2012  . Major depression in remission ([place]) 11/23/2012  . Vitamin D deficiency 04/24/2011  . Essential hypertension, benign 05/30/2010  . Pure hypercholesterolemia 05/30/2010  . Controlled type 2 diabetes mellitus without complication ([place]) 42/70/6237  . Hypothyroidism 05/30/2010     Past Surgical History:  Procedure Laterality Date  . CESAREAN SECTION     x 2  . COLONOSCOPY  [id number]  . ESOPHAGOGASTRODUODENOSCOPY  [id number]  . FOOT SURGERY Right  01/2013   bone spur  . TUBAL LIGATION       Prior to Admission medications   Medication Sig Start Date End Date Taking? Authorizing Provider  Alogliptin Benzoate 25 MG TABS Take 1 tablet by mouth daily. 07/20/14  Yes Rita Ohara, MD  amLODipine (NORVASC) 10 MG tablet Take 1 tablet (10 mg total) by mouth daily. 06/27/14  Yes Rita Ohara, MD  aspirin 81 MG tablet Take 81 mg by mouth daily.     Yes [provider]  atorvastatin (LIPITOR) 20 MG tablet Take 20 mg by mouth daily.   Yes [provider]  BAYER CONTOUR TEST test strip TEST BLOOD SUGARS 3 TIMES DAILY 12/27/13  Yes Rita Ohara, MD  ibuprofen (ADVIL,MOTRIN) 600 MG tablet Take 600 mg by mouth every 8 (eight) hours as needed for pain. 11/07/17  Yes [provider]  levothyroxine (SYNTHROID, LEVOTHROID) 75 MCG tablet Take 75 mcg by mouth daily. 10/09/17  Yes [provider]  metFORMIN (GLUCOPHAGE) 1000 MG tablet take 1 tablet by mouth twice a day with food Patient taking differently: Take 1,000 mg by mouth daily.  06/27/14  Yes Rita Ohara, MD  omeprazole (PRILOSEC) 20 MG capsule take 1 capsule by mouth once daily Patient taking differently: Take 20 mg by mouth daily.  11/29/13  Yes Rita Ohara, MD  telmisartan-hydrochlorothiazide (MICARDIS HCT) 80-25 MG tablet Take 1 tablet by mouth daily. 10/24/17  Yes [provider]  cholecalciferol (VITAMIN D) 1000 UNITS tablet Take 2,000 Units by mouth daily.  [provider]  Multiple Vitamins-Minerals (CENTRUM SILVER PO) Take 1 tablet by mouth daily.      [provider]     Allergies Patient has no known allergies.   Family History  Problem Relation Age of Onset  . Hypertension Mother   . Kidney disease Mother        renal failure, dialysis  . Hypertension Father   . Stroke Father   . Arthritis Maternal Aunt   . Heart disease Maternal Aunt   . Diabetes Maternal Grandmother   . Cancer Neg Hx   . Breast cancer Neg Hx   . Colon  cancer Neg Hx     Social History Social History   Tobacco Use  . Smoking status: Never Smoker  . Smokeless tobacco: Never Used  Substance Use Topics  . Alcohol use: Not Currently    Comment: socially maybe 1-2 times monthly  . Drug use: No    Review of Systems  Constitutional:   No fever or chills.  ENT:   No sore throat. No rhinorrhea. Cardiovascular:   No chest pain or syncope. Respiratory:   No dyspnea or cough. Gastrointestinal:   Negative for abdominal pain, vomiting and diarrhea.  Musculoskeletal:   Positive right sided chest wall pain after fall at the doctor's office All other systems reviewed and are negative except as documented above in ROS and HPI.  ____________________________________________   PHYSICAL EXAM:  VITAL SIGNS: ED Triage Vitals  Enc Vitals Group     BP 11/12/17 1039 (!) 169/83     Pulse Rate 11/12/17 1039 86     Resp 11/12/17 1039 20     Temp 11/12/17 1039 98.6 F (37 C)     Temp Source 11/12/17 1039 Oral     SpO2 11/12/17 1039 99 %     Weight 11/12/17 1040 200 lb (90.7 kg)     Height 11/12/17 1040 5\' 6"  (1.676 m)     Head Circumference --      Peak Flow --      Pain Score 11/12/17 1050 0     Pain Loc --      Pain Edu? --      Excl. in [place]? --     Vital signs reviewed, nursing assessments reviewed.   Constitutional:   Alert and oriented. Non-toxic appearance. Eyes:   Conjunctivae are normal. EOMI. PERRL. ENT      Head:   Normocephalic and atraumatic.      Nose:   No congestion/rhinnorhea.       Mouth/Throat:   MMM, no pharyngeal erythema. No peritonsillar mass.       Neck:   No meningismus. Full ROM.  No midline tenderness. Hematological/Lymphatic/Immunilogical:   No cervical lymphadenopathy. Cardiovascular:   RRR. Symmetric bilateral radial and DP pulses.  No murmurs. Cap refill less than 2 seconds. Respiratory:   Normal respiratory effort without tachypnea/retractions. Breath sounds are clear and equal bilaterally. No  wheezes/rales/rhonchi. Gastrointestinal:   Soft and nontender. Non distended. There is no CVA tenderness.  No rebound, rigidity, or guarding.  Musculoskeletal:   Normal range of motion in all extremities. No joint effusions.  No lower extremity tenderness.  No edema.  There is tenderness over the left trapezius reproducing her neck pain Neurologic:   Normal speech and language.  Motor grossly intact. Slightly impaired right upper extremity finger-to-nose. Impaired right lower extremity heel-to-shin maneuver With ambulation she walks with a limping gait but able to bear weight and transfer.. Skin:  Skin is warm, dry and intact. No rash noted.  No petechiae, purpura, or bullae.  ____________________________________________    LABS (pertinent positives/negatives) (all labs ordered are listed, but only abnormal results are displayed) Labs Reviewed  BASIC METABOLIC PANEL - Abnormal; Notable for the following components:      Result Value   Glucose, Bld 213 (*)    Creatinine, Ser 1.04 (*)    GFR calc non Af Amer 55 (*)    All other components within normal limits  URINALYSIS, COMPLETE (UACMP) WITH MICROSCOPIC - Abnormal; Notable for the following components:   Color, Urine YELLOW (*)    APPearance CLEAR (*)    Glucose, UA 150 (*)    All other components within normal limits  CBC WITH DIFFERENTIAL/PLATELET  HIV ANTIBODY (ROUTINE TESTING W REFLEX)  HEMOGLOBIN A1C  LIPID PANEL  CBC  CREATININE, SERUM   ____________________________________________   EKG  Interpreted by me Normal sinus rhythm rate of 79, left axis, normal intervals.  Poor R wave progression.  Normal ST segments and T waves.  ____________________________________________    RADIOLOGY  Ct Head Wo Contrast  Result Date: 11/12/2017 CLINICAL DATA:  Neuro deficit, subacute, RIGHT leg weakness, recent falls, history hypertension, diabetes mellitus EXAM: CT HEAD WITHOUT CONTRAST TECHNIQUE: Contiguous axial images  were obtained from the base of the skull through the vertex without intravenous contrast. Sagittal and coronal MPR images reconstructed from axial data set. COMPARISON:  MR brain 09/28/2010 FINDINGS: Brain: Normal ventricular morphology. No midline shift or mass effect. Mild small vessel chronic ischemic changes of deep cerebral white matter. No intracranial hemorrhage, mass lesion, or evidence of acute infarction. No extra-axial fluid collections. Vascular: Atherosclerotic calcification of internal carotid arteries bilaterally at skull base. No hyperdense vessels. Skull: Intact Sinuses/Orbits: Mucosal thickening throughout the ethmoid air cells and maxillary sinus apices. Other: N/A IMPRESSION: No acute intracranial abnormalities. Mild small vessel chronic ischemic changes of deep cerebral white matter. Electronically Signed   By: Lavonia Dana M.D.   On: 11/12/2017 11:43   Mr Brain Wo Contrast  Result Date: 11/12/2017 CLINICAL DATA:  Right lower extremity weakness, abnormal cerebellar testing, focal neuro deficit, greater than 6 hours. EXAM: MRI HEAD WITHOUT CONTRAST TECHNIQUE: Multiplanar, multiecho pulse sequences of the brain and surrounding structures were obtained without intravenous contrast. COMPARISON:  None. FINDINGS: Brain: Subtle diffusion signal changes are present in the anteromedial frontal lobes bilaterally. There is also abnormal signal on the diffusion trace images in the posteromedial left frontal lobe, distal ACA territory. This mostly represents T2 shine through without significant restricted diffusion on the ADC maps. Additional periventricular white matter changes bilaterally are moderately advanced for age. Ventricles are of normal size. Basal ganglia are within normal limits. The brainstem and cerebellum are normal. Vascular: Flow is present in the major intracranial arteries. Skull and upper cervical spine: Skull base is within normal limits. Craniocervical junction is normal.  Sinuses/Orbits: The left maxillary sinus is near completely opacified. Mucosal thickening is present in the right maxillary sinus inferiorly. Nasal polyps are evident without nasal obstruction. Scattered ethmoid opacification is worse on the left. The left frontal sinus is opacified. The sphenoid sinuses are clear. Mastoid air cells are clear. Globes and orbits are within normal limits. IMPRESSION: 1. Diffusion signal abnormality in the distal left ACA territory. Abnormal signal in the posteromedial left frontal lobe is consistent with a subacute to chronic infarct. This corresponds to right lower extremity weakness. 2. Subtle anteromedial diffusion signal abnormality bilaterally may also reflect some degree  of ischemia. 3. Diffuse white matter disease is moderately advanced for age. This likely reflects the sequela of chronic microvascular ischemia. Electronically Signed   By: San Morelle M.D.   On: 11/12/2017 16:05    ____________________________________________   PROCEDURES Procedures  ____________________________________________  DIFFERENTIAL DIAGNOSIS   Muscular weakness, subacute stroke, subdural hematoma, cerebral hemorrhage  CLINICAL IMPRESSION / ASSESSMENT AND PLAN / ED COURSE  Pertinent labs & imaging results that were available during my care of the patient were reviewed by me and considered in my medical decision making (see chart for details).    Patient presents with chronic right-sided weakness and 2 weeks of right leg dragging.  Exam concerning for cerebellar deficits with intact motor strength.  Initial CT head negative for acute traumatic injury such as intracranial hemorrhage.  Labs unremarkable, vitals unremarkable.  Clinical Course as of Nov 12 1745  Wed Nov 12, 2017  1313 Initial work-up negative, however the patient is having right leg dragging for the past 2 weeks in the setting of chronic subjective right-sided weakness for the past 15 years.  With her age  and comorbidities, it is possible that she has had a previous undiagnosed stroke, and more recently has had a subacute stroke.  Discussed continuing her outpatient management versus obtaining MRI today, patient and I agree that an MRI today is in her best interest for more expeditious diagnosis of possible stroke which would change the management timeline.  I will give oral Valium for anxiolysis prior to MRI.  No acute stroke symptoms, not a code stroke or lysis or interventional candidate.   [PS]  6659 MRI reveals evidence of both chronic and subacute ischemic changes consistent with stroke causing her symptoms today.  Given her comorbidities and age and ongoing symptoms, I will give a full dose aspirin and recommend hospitalization for further stroke work-up.   [PS]    Clinical Course User Index [PS] Carrie Mew, MD     ____________________________________________   FINAL CLINICAL IMPRESSION(S) / ED DIAGNOSES    Final diagnoses:  Ischemic stroke [facility])  Right leg weakness     ED Discharge Orders    None      Portions of this note were generated with dragon dictation software. Dictation errors may occur despite best attempts at proofreading.    Carrie Mew, MD 11/12/17 1747

## 2017-11-12 NOTE — ED Notes (Signed)
Family FriendMorey Hummingbird phone no: [phone]

## 2017-11-12 NOTE — ED Notes (Signed)
At time of Triage, reviewed patient complaint with Dr. Alfred Levins who gave me verbal orders.  Patient was assessed as a fall risk and yellow fall risk bracelet applied in Triage.

## 2017-11-13 ENCOUNTER — Observation Stay
Admit: 2017-11-13 | Discharge: 2017-11-13 | Disposition: A | Discharge status: Home / Self Care | Payer: No Typology Code available for payment source | Attending: Internal Medicine | Admitting: Internal Medicine

## 2017-11-13 DIAGNOSIS — R29898 Other symptoms and signs involving the musculoskeletal system: Secondary | ICD-10-CM

## 2017-11-13 LAB — ECHOCARDIOGRAM COMPLETE
Height: 66 in
Weight: 3273.6 oz

## 2017-11-13 LAB — HEMOGLOBIN A1C
Hgb A1c MFr Bld: 8.3 % — ABNORMAL HIGH (ref 4.8–5.6)
Mean Plasma Glucose: 191.51 mg/dL

## 2017-11-13 LAB — LIPID PANEL
CHOL/HDL RATIO: 3 ratio
CHOLESTEROL: 152 mg/dL (ref 0–200)
HDL: 50 mg/dL (ref >40)
LDL Cholesterol: 85 mg/dL (ref 0–99)
TRIGLYCERIDES: 85 mg/dL (ref <150)
VLDL: 17 mg/dL (ref 0–40)

## 2017-11-13 MED ORDER — ALPRAZOLAM 0.25 MG PO TABS: 0.2500 mg | ORAL_TABLET | Freq: Three times a day (TID) | ORAL | Status: DC | PRN

## 2017-11-13 MED ORDER — ATORVASTATIN CALCIUM 20 MG PO TABS
40.0000 mg | ORAL_TABLET | Freq: Every day | ORAL | Status: DC
  Administered 2017-11-13: 40 mg via ORAL

## 2017-11-13 MED ORDER — ASPIRIN 325 MG PO TBEC: 325.0000 mg | DELAYED_RELEASE_TABLET | Freq: Every day | ORAL | 2 refills | Status: DC

## 2017-11-13 MED ORDER — ATORVASTATIN CALCIUM 40 MG PO TABS: 40.0000 mg | ORAL_TABLET | Freq: Every day | ORAL | 2 refills | Status: DC

## 2017-11-13 MED ORDER — ASPIRIN EC 325 MG PO TBEC: 325.0000 mg | DELAYED_RELEASE_TABLET | Freq: Every day | ORAL | Status: DC

## 2017-11-13 MED ORDER — LORAZEPAM 1 MG PO TABS: 1.0000 mg | ORAL_TABLET | Freq: Once | ORAL | Status: DC

## 2017-11-13 NOTE — Care Management Note (Signed)
Case Management Note  Patient Details  Name: Ann Weiss MRN: [id number] Date of Birth: 10-07-1950  Subjective/Objective:  Admitted to [facility] with the diagnosis of acute ischemic left ACA stroke. Lives with husband, Sonia Side [phone]). Seen Dr, Delfina Redwood last Friday. Prescriptions are filled at [facility] in [place].  Takes care of all basic activities of daily living herself, drives. No home health. No skilled facility. No home oxygen. No medical equipment in the home.  No falls Good appetite.                   Action/Plan: Therapy is recommending outpatient services. Discussed with Ms. Rallis. States she is in agreement. Will get Dr. Bridgett Larsson to sign and fax to rehab department   Expected Discharge Date:  11/13/17               Expected Discharge Plan:     In-House Referral:   yes  Discharge planning Services   yes  Post Acute Care Choice:   yes Choice offered to:   patient  DME Arranged:    DME Agency:     HH Arranged:    HH Agency:     Status of Service:     If discussed at [employer], dates discussed:    Additional Comments:  Shelbie Ammons, RN MSN CCM Care Management [phone] 11/13/2017, 1:39 PM

## 2017-11-13 NOTE — Evaluation (Addendum)
Physical Therapy Evaluation Patient Details Name: Ann Weiss MRN: [id number] DOB: 12-12-1950 Today's Date: 11/13/2017   History of Present Illness  67 year old female patient with the risk factors of hypertension, diabetes, hypothyroidism, hyperlipidemia having a right leg weakness for 2 weeks, MRI of the brain showed subacute stroke in left ACA.  Clinical Impression  Patient A&Ox4, agreeable to PT. Reports at baseline she is independent in IADLs/ADLs, works full time, lives with husband in 2 story home. Pt does state that in the last 2 months or so she feels her LE, especially the RLE is progressively getting weaker, and has had two recent falls.  Upon assessment patient demonstrated deficits in LE/UE strength R>L, as well as impaired coordination of RUE/RLE. Sensation, eye tracking, cognition WFLs. Patient was independent for bed mobility, transfers and ambulation. With higher balance assessment, patient did demonstrate deficits that increase pt's risk of falls. The patient would benefit from skilled PT to address deficits in RLE/RUE strength, coordination, as well as balance deficits to maximize independence, safety, and balance.    Follow Up Recommendations Outpatient PT    Equipment Recommendations  None recommended by PT    Recommendations for Other Services       Precautions / Restrictions Restrictions Weight Bearing Restrictions: No      Mobility  Bed Mobility Overal bed mobility: Independent                Transfers Overall transfer level: Independent Equipment used: None                Ambulation/Gait Ambulation/Gait assistance: Independent Gait Distance (Feet): 180 Feet Assistive device: None   Gait velocity: decreased   General Gait Details: Pt abducts RLE, stiff R ankle (very little DF/PF during ambulation) slightly staggering gait, but stable.   Stairs            Wheelchair Mobility    Modified Rankin (Stroke Patients Only)        Balance Overall balance assessment: Needs assistance   Sitting balance-Leahy Scale: Normal       Standing balance-Leahy Scale: Good                   Standardized Balance Assessment Standardized Balance Assessment : Dynamic Gait Index   Dynamic Gait Index Level Surface: Mild Impairment Change in Gait Speed: Mild Impairment Gait with Horizontal Head Turns: Moderate Impairment Gait with Vertical Head Turns: Mild Impairment Gait and Pivot Turn: Mild Impairment Step Over Obstacle: Mild Impairment Step Around Obstacles: Mild Impairment Steps: Mild Impairment Total Score: 15       Pertinent Vitals/Pain Pain Assessment: No/denies pain    Home Living Family/patient expects to be discharged to:: Private residence Living Arrangements: Spouse/significant other Available Help at Discharge: Family;Available PRN/intermittently Type of Home: House Home Access: Stairs to enter Entrance Stairs-Rails: Can reach both Entrance Stairs-Number of Steps: 5 Home Layout: Two level;Bed/bath upstairs Home Equipment: Toilet riser      Prior Function Level of Independence: Independent         Comments: working full time, driving. Pt reports 2 falls in the last two weeks, feels her LE are weaker in the last 2 months.     Hand Dominance   Dominant Hand: Left    Extremity/Trunk Assessment   Upper Extremity Assessment Upper Extremity Assessment: RUE deficits/detail;LUE deficits/detail RUE Deficits / Details: 4-/5 grossly RUE Coordination: decreased gross motor LUE Deficits / Details: 4+/5 grossly LUE Coordination: WNL    Lower Extremity Assessment Lower Extremity Assessment: RLE  deficits/detail;LLE deficits/detail RLE Deficits / Details: 4-/5 grossly RLE Coordination: decreased gross motor LLE Deficits / Details: 4+/5 grossly LLE Coordination: WNL       Communication   Communication: No difficulties  Cognition Arousal/Alertness: Awake/alert Behavior During Therapy:  WFL for tasks assessed/performed Overall Cognitive Status: Within Functional Limits for tasks assessed                                        General Comments      Exercises Other Exercises Other Exercises: Pt instructed in heel/toe raises as well as LAQ bilaterally at least 1x an hour, pt agreeable.   Assessment/Plan    PT Assessment Patient needs continued PT services  PT Problem List Decreased strength;Decreased mobility;Decreased balance       PT Treatment Interventions Therapeutic activities;Gait training;Therapeutic exercise;Patient/family education;Stair training;Balance training;Functional mobility training;Neuromuscular re-education    PT Goals (Current goals can be found in the Care Plan section)  Acute Rehab PT Goals Patient Stated Goal: Pt would like to strengthen both of her legs, not fall PT Goal Formulation: With patient/family Time For Goal Achievement: 11/27/17 Potential to Achieve Goals: Good Additional Goals Additional Goal #1: Patient will improve DGI score by at least 5 points indicating improved ability to perform functional tasks and improved safety.    Frequency Min 2X/week   Barriers to discharge        Co-evaluation               AM-PAC PT "6 Clicks" Daily Activity  Outcome Measure Difficulty turning over in bed (including adjusting bedclothes, sheets and blankets)?: None Difficulty moving from lying on back to sitting on the side of the bed? : None Difficulty sitting down on and standing up from a chair with arms (e.g., wheelchair, bedside commode, etc,.)?: A Little Help needed moving to and from a bed to chair (including a wheelchair)?: None Help needed walking in hospital room?: None Help needed climbing 3-5 steps with a railing? : A Little 6 Click Score: 22    End of Session Equipment Utilized During Treatment: Gait belt Activity Tolerance: Patient tolerated treatment well Patient left: in chair;with chair alarm  set;with family/visitor present;with call bell/phone within reach Nurse Communication: Mobility status PT Visit Diagnosis: Unsteadiness on feet (R26.81);History of falling (Z91.81);Muscle weakness (generalized) (M62.81)    Time: 1030-1057 PT Time Calculation (min) (ACUTE ONLY): 27 min   Charges:   PT Evaluation $PT Eval Low Complexity: 1 Low PT Treatments $Therapeutic Activity: 8-22 mins        Lieutenant Diego PT, DPT 11:19 AM,11/13/17 [phone]

## 2017-11-13 NOTE — Evaluation (Signed)
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   Clinical Impression   Pt seen for OT evaluation this date. Prior to hospital admission, pt was independent in all aspects of ADL/IADL and denies falls history in past 12 months. Pt lives with her spouse. Currently pt reporting R side weakness. Pt demonstrates impairments in R side strength and fine motor coordination requiring additional time/effort to perform bilateral ADL tasks. No sensory, cognitive, or visual deficits appreciated with assessment. Pt/spouse instructed in [facility] exercises handout with pt able to return demo technique for each of the exercises prescribed. Handout provided, rec pt perform 5x/day and if no improvement in RUE [facility], seek outpatient OT referral from PCP. No additional acute skilled OT needs identified. Will sign off. Please re-consult if additional OT needs arise.    Follow Up Recommendations  No OT follow up    Equipment Recommendations  None recommended by OT    Recommendations for Other Services       Precautions / Restrictions Precautions Precautions: Fall Restrictions Weight Bearing Restrictions: No      Mobility Bed Mobility             General bed mobility comments: deferred, up in recliner  Transfers Overall transfer level: Independent Equipment used: None                  Balance Overall balance assessment: Needs assistance   Sitting balance-Leahy Scale: Normal       Standing balance-Leahy Scale: Good                       ADL either performed or assessed with clinical judgement   ADL Overall ADL's : Modified independent                                        General ADL Comments: pt able to complete ADL tasks but with increased time/effort while incorporating RUE into task     Vision Baseline Vision/History: Wears glasses Patient Visual Report: No change from baseline       Perception     Praxis      Pertinent Vitals/Pain Pain Assessment: No/denies pain     Hand Dominance Left   Extremity/Trunk Assessment Upper Extremity Assessment Upper Extremity Assessment: RUE deficits/detail;LUE deficits/detail RUE Deficits / Details: 4-/5 grossly; impaired FMC with testing RUE Sensation: WNL RUE Coordination: decreased fine motor;decreased gross motor LUE Deficits / Details: 4+/5 grossly LUE Sensation: WNL LUE Coordination: WNL   Lower Extremity Assessment Lower Extremity Assessment: Defer to PT evaluation;RLE deficits/detail;LLE deficits/detail RLE Deficits / Details: 4-/5 grossly RLE Coordination: decreased gross motor LLE Deficits / Details: 4+/5 grossly LLE Coordination: WNL   Cervical / Trunk Assessment Cervical / Trunk Assessment: Normal   Communication Communication Communication: No difficulties   Cognition Arousal/Alertness: Awake/alert Behavior During Therapy: WFL for tasks assessed/performed Overall Cognitive Status: Within Functional Limits for tasks assessed                                     General Comments  Exercises Other Exercises  Other Exercises: pt/spouse instructed in [facility] exercises handout with pt able to return demo technique for each of the exercises prescribed. Handout provided.   Shoulder Instructions      Home Living Family/patient expects to be discharged to:: Private residence Living Arrangements: Spouse/significant other Available Help at Discharge: Family;Available PRN/intermittently Type of Home: House Home Access: Stairs to enter [employer] of Steps: 5 Entrance Stairs-Rails: Can reach both Home Layout: Two level;Bed/bath  upstairs Alternate Level Stairs-Number of Steps: 15 Alternate Level Stairs-Rails: Right;Left Bathroom Shower/Tub: Occupational psychologist: Standard Bathroom Accessibility: Yes   Home Equipment: Toilet riser          Prior Functioning/Environment Level of Independence: Independent        Comments: working full time Technical sales engineer work), driving. Pt reports 2 falls in the last two weeks, feels her LE are weaker in the last 2 months.        OT Problem List: Decreased coordination      OT Treatment/Interventions:      OT Goals(Current goals can be found in the care plan section) Acute Rehab OT Goals Patient Stated Goal: Pt would like to strengthen both of her legs, not fall OT Goal Formulation: All assessment and education complete, DC therapy  OT Frequency:     Barriers to D/C:            Co-evaluation              AM-PAC PT "6 Clicks" Daily Activity     Outcome Measure Help from another person eating meals?: None Help from another person taking care of personal grooming?: None Help from another person toileting, which includes using toliet, bedpan, or urinal?: None Help from another person bathing (including washing, rinsing, drying)?: None Help from another person to put on and taking off regular upper body clothing?: None Help from another person to put on and taking off regular lower body clothing?: None 6 Click Score: 24   End of Session    Activity Tolerance: Patient tolerated treatment well Patient left: in chair;with call bell/phone within reach;with chair alarm set;with family/visitor present  OT Visit Diagnosis: Other abnormalities of gait and mobility (R26.89)                Time: 1610-9604 OT Time Calculation (min): 12 min Charges:  OT General Charges $OT Visit: 1 Visit OT Evaluation $OT Eval Low Complexity: 1 Low OT Treatments $Therapeutic Exercise: 8-22 mins  Jeni Salles, MPH, MS, OTR/L ascom [phone] 11/13/17, 11:36  AM

## 2017-11-13 NOTE — Progress Notes (Signed)
SLP Cancellation Note  Patient Details Name: Ann Weiss MRN: [id number] DOB: 11/22/1950   Cancelled treatment:       Reason Eval/Treat Not Completed: SLP screened, no needs identified, will sign off(chart reviewed; consulted NSG then met w/ pt). Pt denied any difficulty swallowing and is currently on a regular diet; tolerates swallowing pills w/ water per NSG. Pt conversed at conversational level w/out deficits noted; pt denied any speech-language deficits.  No further skilled ST services indicated as pt appears at her baseline. Pt agreed. NSG to reconsult if any change in status.     Katherine Watson, MS, CCC-SLP Watson,Katherine 11/13/2017, 10:48 AM   

## 2017-11-13 NOTE — Progress Notes (Signed)
*  PRELIMINARY RESULTS* Echocardiogram 2D Echocardiogram has been performed. Agitated saline study was performed.  Ann Weiss 11/13/2017, 9:47 AM

## 2017-11-13 NOTE — Progress Notes (Signed)
Patient is scheduled to have a MRI of lumber spine tonight and then discharge home. Patient was informed that MRI was to be done around 2100. MRI notified around 2120, MRI can't be done for a couple of hours due to other emergency cases. Patient would like  to go home and rescheduled MRI outpatient, MD notified. Discharged orders already in place. Patient discharged home via husband at 2145. Patient informed and encouraged to make outpatient appointment with neurologist for another MRI of the lumber spine.

## 2017-11-13 NOTE — Discharge Instructions (Signed)
Outpatient PT

## 2017-11-13 NOTE — Progress Notes (Signed)
Inpatient Diabetes Program Recommendations  AACE/ADA: New Consensus Statement on Inpatient Glycemic Control (2015)  Target Ranges:  Prepandial:   less than 140 mg/dL      Peak postprandial:   less than 180 mg/dL (1-2 hours)      Critically ill patients:  140 - 180 mg/dL   Lab Results  Component Value Date   GLUCAP 198 (H) 11/10/2007   HGBA1C 8.3 (H) 11/13/2017    Review of Glycemic Control Results for Ann Weiss, Ann Weiss (MRN [id number]) as of 11/13/2017 12:17  Ref. Range 11/10/2007 08:26  Glucose-Capillary Unknown 198 (H)   Diabetes history: DM 2 Outpatient Diabetes medications: Metformin 1000 mg daily Current orders for Inpatient glycemic control:  Metformin 1000 mg bid  Inpatient Diabetes Program Recommendations:    Spoke with patient and husband regarding increased A1C.  Discussed that average blood sugars have been 191 mg/dL.  Patient states that she rarely eats during the day and only eats big meal at night.  We discussed importance of eating small amounts through out day.  She is currently only taking Metformin once a day.  Discussed that she may need twice a day to help with A1C and blood sugars.  We briefly discussed DM and how it increases risk for stroke, heart problems, etc.  Patient states "I did not know this".  Encouraged her to check blood sugars at least twice a day to see if blood sugars are increased in the evening.  Also encouraged f/u with PCP.  Patient appreciative of information.  Gave her handout on A1C, normal blood sugars values and standards of care.   Thanks,  Adah Perl, RN, BC-ADM Inpatient Diabetes Coordinator Pager [phone] (8a-5p)

## 2017-11-13 NOTE — Consult Note (Signed)
Referring Physician: Estanislado Spire    Chief Complaint: Right-sided weakness  HPI: Ann Weiss is an 67 y.o. female with history of diabetes mellitus, hyperlipidemia, hypertension, hypothyroidism, chronic constipation presenting with chief complaints of right side weakness and fall. She tripped on a rug and fell at work hitting her head on the wall before falling to the ground.  She denies loss of consciousness but states that she developed left occipital headache and shoulder pain after the fall.  Patient report that she has been dragging her right leg for the past 2 weeks and has had multiple falls due to her weakness.  Patient states that she has had right side weakness since 2004 and has been evaluated in the past by neurology with no identifiable cause of her weakness.  She feels like her symptoms is been gradually worsening over the years.  Denies associated associated symptoms of paresthesia, shuffling gait, rigidity, tremors, hypophonia, decreased facial expression, bradykinesia, bradyphrenia, vision disturbances, speech disturbances, headaches, nausea or vomiting, cranial nerve deficit, or other focal neurologic deficit.  Patient however states she has had some stiffness in her leg without pain.  She is also noticed that in the last 4 months she has developed a decreased sense of smell.  Patient states she has had imaging in the past with questionable "multilocular cystic lesion in her brain".  In the ED she had work-up with CT of the head which did not show acute intracranial abnormality.  Follow-up MRI of the brain showed diffuse signal abnormality in the distal left ACA territory consistent with subacute to chronic infarct.  Initial NIH stroke scale 0.  Date last known well: Unable to determine  Time last known well: Unable to determine tPA Given: No: Unable to determine date or time last known well. NIH stroke scale 0.  Past Medical History:  Diagnosis Date  . Constipation   . Diabetes mellitus    . GERD (gastroesophageal reflux disease)   . Goiter   . Hyperlipidemia   . Hypertension   . Hypothyroidism   . Premature menopause age 74  . Vitamin D deficiency 2009    Past Surgical History:  Procedure Laterality Date  . CESAREAN SECTION     x 2  . COLONOSCOPY  [id number]  . ESOPHAGOGASTRODUODENOSCOPY  [id number]  . FOOT SURGERY Right 01/2013   bone spur  . TUBAL LIGATION      Family History  Problem Relation Age of Onset  . Hypertension Mother   . Kidney disease Mother        renal failure, dialysis  . Hypertension Father   . Stroke Father   . Arthritis Maternal Aunt   . Heart disease Maternal Aunt   . Diabetes Maternal Grandmother   . Cancer Neg Hx   . Breast cancer Neg Hx   . Colon cancer Neg Hx    Social History:  reports that she has never smoked. She has never used smokeless tobacco. She reports that she drank alcohol. She reports that she does not use drugs.  Allergies: No Known Allergies  Medications:  I have reviewed the patient's current medications. Prior to Admission:  Medications Prior to Admission  Medication Sig Dispense Refill Last Dose  . Alogliptin Benzoate 25 MG TABS Take 1 tablet by mouth daily. 30 tablet 4 11/11/2017 at Unknown time  . amLODipine (NORVASC) 10 MG tablet Take 1 tablet (10 mg total) by mouth daily. 30 tablet 5 11/12/2017 at 0800  . aspirin 81 MG tablet Take 81 mg by  mouth daily.     Past Month at Unknown time  . atorvastatin (LIPITOR) 20 MG tablet Take 20 mg by mouth daily.   11/12/2017 at 0800  . BAYER CONTOUR TEST test strip TEST BLOOD SUGARS 3 TIMES DAILY 100 each 2 Taking  . cholecalciferol (VITAMIN D) 1000 UNITS tablet Take 2,000 Units by mouth daily.   11/11/2017 at Unknown time  . ibuprofen (ADVIL,MOTRIN) 600 MG tablet Take 600 mg by mouth every 8 (eight) hours as needed for pain.   11/12/2017 at PRN  . levothyroxine (SYNTHROID, LEVOTHROID) 75 MCG tablet Take 75 mcg by mouth daily.  3 11/11/2017 at Unknown time  . metFORMIN  (GLUCOPHAGE) 1000 MG tablet take 1 tablet by mouth twice a day with food (Patient taking differently: Take 1,000 mg by mouth daily. ) 60 tablet 5 11/11/2017 at Unknown time  . Multiple Vitamins-Minerals (CENTRUM SILVER PO) Take 1 tablet by mouth daily.     11/11/2017 at Unknown time  . omeprazole (PRILOSEC) 20 MG capsule take 1 capsule by mouth once daily (Patient taking differently: Take 20 mg by mouth daily. ) 30 capsule 11 11/11/2017 at Unknown time  . telmisartan-hydrochlorothiazide (MICARDIS HCT) 80-25 MG tablet Take 1 tablet by mouth daily.  6 11/11/2017 at Unknown time   Scheduled: . [START ON 11/14/2017] aspirin EC  325 mg Oral Daily  . atorvastatin  40 mg Oral q1800  . enoxaparin (LOVENOX) injection  40 mg Subcutaneous Q24H  . ezetimibe  10 mg Oral QHS  . Influenza vac split quadrivalent PF  0.5 mL Intramuscular Tomorrow-1000  . levothyroxine  50 mcg Oral Daily  . linagliptin  5 mg Oral Daily  . metFORMIN  1,000 mg Oral BID WC  . pantoprazole  40 mg Oral Daily    ROS: History obtained from the patient   General ROS: negative for - chills, fatigue, fever, night sweats, weight gain or weight loss Psychological ROS: negative for - behavioral disorder, hallucinations, memory difficulties, mood swings or suicidal ideation Ophthalmic ROS: negative for - blurry vision, double vision, eye pain or loss of vision ENT ROS: negative for - epistaxis, nasal discharge, oral lesions, sore throat, tinnitus or vertigo Allergy and Immunology ROS: negative for - hives or itchy/watery eyes Hematological and Lymphatic ROS: negative for - bleeding problems, bruising or swollen lymph nodes Endocrine ROS: negative for - galactorrhea, hair pattern changes, polydipsia/polyuria or temperature intolerance Respiratory ROS: negative for - cough, hemoptysis, shortness of breath or wheezing Cardiovascular ROS: negative for - chest pain, dyspnea on exertion, edema or irregular heartbeat Gastrointestinal ROS: negative  for - abdominal pain, diarrhea, hematemesis, nausea/vomiting or stool incontinence Genito-Urinary ROS: negative for - dysuria, hematuria, incontinence or urinary frequency/urgency Musculoskeletal ROS: positive for - right side weakness Neurological ROS: as noted in HPI Dermatological ROS: negative for rash and skin lesion changes  Physical Examination: Blood pressure 137/74, pulse 65, temperature 98.2 F (36.8 C), temperature source Oral, resp. rate 14, height 5\' 6"  (1.676 m), weight 92.8 kg, SpO2 99 %.   HEENT-  Normocephalic, no lesions, without obvious abnormality.  Normal external eye and conjunctiva.  Normal TM's bilaterally.  Normal auditory canals and external ears. Normal external nose, mucus membranes and septum.  Normal pharynx. Cardiovascular- S1, S2 normal, pulses palpable throughout   Lungs- chest clear, no wheezing, rales, normal symmetric air entry Abdomen- soft, non-tender; bowel sounds normal; no masses,  no organomegaly Extremities- no edema Lymph-no adenopathy palpable Musculoskeletal-no joint tenderness, deformity or swelling Skin-warm and dry, no hyperpigmentation,  vitiligo, or suspicious lesions  Neurological Exam   Mental Status: Alert, oriented, thought content appropriate.  Speech fluent without evidence of aphasia.  Able to follow 3 step commands without difficulty. Attention span and concentration seemed appropriate  Cranial Nerves: II: Discs flat bilaterally; Visual fields grossly normal, pupils equal, round, reactive to light and accommodation III,IV, VI: ptosis not present, extra-ocular motions intact bilaterally V,VII: decrease in right NLF, facial light touch sensation intact VIII: hearing normal bilaterally IX,X: gag reflex present XI: bilateral shoulder shrug XII: midline tongue extension Motor: Right :  Upper extremity   5-/5 Without pronator drift, + bradykinesia. No cogwheeling Left: Upper extremity   5/5 without pronator drift. - for bradykinesia  or cogwheeling Right:   Lower extremity   4+/5 proximally with 5/5 strength distally                                          Left: Lower extremity   5/5 Tone and bulk:normal tone throughout; no atrophy noted Sensory: Pinprick and light touch intact bilaterally Deep Tendon Reflexes: 3+ and symmetric throughout Plantars: Right: downgoing                            Left: downgoing Cerebellar: Finger-to-nose testing intact bilaterally. Heel to shin testing normal bilaterally Gait: not tested due to safety concerns  Data Reviewed  Laboratory Studies:  Basic Metabolic Panel: Recent Labs  Lab 11/12/17 1109 11/12/17 1857  NA 142  --   K 4.1  --   CL 105  --   CO2 29  --   GLUCOSE 213*  --   BUN 13  --   CREATININE 1.04* 0.73  CALCIUM 9.5  --     Liver Function Tests: No results for input(s): AST, ALT, ALKPHOS, BILITOT, PROT, ALBUMIN in the last 168 hours. No results for input(s): LIPASE, AMYLASE in the last 168 hours. No results for input(s): AMMONIA in the last 168 hours.  CBC: Recent Labs  Lab 11/12/17 1109 11/12/17 1857  WBC 7.4 9.2  NEUTROABS 4.0  --   HGB 12.9 13.6  HCT 39.6 42.2  MCV 92.5 93.8  PLT 194 207    Cardiac Enzymes: No results for input(s): CKTOTAL, CKMB, CKMBINDEX, TROPONINI in the last 168 hours.  BNP: Invalid input(s): POCBNP  CBG: No results for input(s): GLUCAP in the last 168 hours.  Microbiology: Results for orders placed or performed in visit on 06/27/14  Urine culture     Status: None   Collection Time: 06/27/14 12:50 PM  Result Value Ref Range Status   Colony Count 20,OOO COLONIES/ML  Final   Organism ID, Bacteria Multiple bacterial morphotypes present, none  Final   Organism ID, Bacteria predominant. Suggest appropriate recollection if   Final   Organism ID, Bacteria clinically indicated.  Final    Coagulation Studies: No results for input(s): LABPROT, INR in the last 72 hours.  Urinalysis:  Recent Labs  Lab 11/12/17 1109   COLORURINE YELLOW*  LABSPEC 1.014  PHURINE 6.0  GLUCOSEU 150*  HGBUR NEGATIVE  BILIRUBINUR NEGATIVE  KETONESUR NEGATIVE  PROTEINUR NEGATIVE  NITRITE NEGATIVE  LEUKOCYTESUR NEGATIVE    Lipid Panel:    Component Value Date/Time   CHOL 152 11/13/2017 0312   TRIG 85 11/13/2017 0312   HDL 50 11/13/2017 0312   CHOLHDL 3.0 11/13/2017 4098  VLDL 17 11/13/2017 0312   LDLCALC 85 11/13/2017 0312    HgbA1C:  Lab Results  Component Value Date   HGBA1C 8.3 (H) 11/13/2017    Urine Drug Screen:  No results found for: LABOPIA, COCAINSCRNUR, LABBENZ, AMPHETMU, THCU, LABBARB  Alcohol Level: No results for input(s): ETH in the last 168 hours.  Other results: EKG: normal EKG, normal sinus rhythm, unchanged from previous tracings. Vent. rate 79 BPM PR interval 168 ms QRS duration 88 ms QT/QTc 398/456 ms P-R-T axes 60 -57 25  Imaging: Ct Head Wo Contrast  Result Date: 11/12/2017 CLINICAL DATA:  Neuro deficit, subacute, RIGHT leg weakness, recent falls, history hypertension, diabetes mellitus EXAM: CT HEAD WITHOUT CONTRAST TECHNIQUE: Contiguous axial images were obtained from the base of the skull through the vertex without intravenous contrast. Sagittal and coronal MPR images reconstructed from axial data set. COMPARISON:  MR brain 09/28/2010 FINDINGS: Brain: Normal ventricular morphology. No midline shift or mass effect. Mild small vessel chronic ischemic changes of deep cerebral white matter. No intracranial hemorrhage, mass lesion, or evidence of acute infarction. No extra-axial fluid collections. Vascular: Atherosclerotic calcification of internal carotid arteries bilaterally at skull base. No hyperdense vessels. Skull: Intact Sinuses/Orbits: Mucosal thickening throughout the ethmoid air cells and maxillary sinus apices. Other: N/A IMPRESSION: No acute intracranial abnormalities. Mild small vessel chronic ischemic changes of deep cerebral white matter. Electronically Signed   By: Lavonia Dana M.D.   On: 11/12/2017 11:43   Mr Brain Wo Contrast  Result Date: 11/12/2017 CLINICAL DATA:  Right lower extremity weakness, abnormal cerebellar testing, focal neuro deficit, greater than 6 hours. EXAM: MRI HEAD WITHOUT CONTRAST TECHNIQUE: Multiplanar, multiecho pulse sequences of the brain and surrounding structures were obtained without intravenous contrast. COMPARISON:  None. FINDINGS: Brain: Subtle diffusion signal changes are present in the anteromedial frontal lobes bilaterally. There is also abnormal signal on the diffusion trace images in the posteromedial left frontal lobe, distal ACA territory. This mostly represents T2 shine through without significant restricted diffusion on the ADC maps. Additional periventricular white matter changes bilaterally are moderately advanced for age. Ventricles are of normal size. Basal ganglia are within normal limits. The brainstem and cerebellum are normal. Vascular: Flow is present in the major intracranial arteries. Skull and upper cervical spine: Skull base is within normal limits. Craniocervical junction is normal. Sinuses/Orbits: The left maxillary sinus is near completely opacified. Mucosal thickening is present in the right maxillary sinus inferiorly. Nasal polyps are evident without nasal obstruction. Scattered ethmoid opacification is worse on the left. The left frontal sinus is opacified. The sphenoid sinuses are clear. Mastoid air cells are clear. Globes and orbits are within normal limits. IMPRESSION: 1. Diffusion signal abnormality in the distal left ACA territory. Abnormal signal in the posteromedial left frontal lobe is consistent with a subacute to chronic infarct. This corresponds to right lower extremity weakness. 2. Subtle anteromedial diffusion signal abnormality bilaterally may also reflect some degree of ischemia. 3. Diffuse white matter disease is moderately advanced for age. This likely reflects the sequela of chronic microvascular  ischemia. Electronically Signed   By: San Morelle M.D.   On: 11/12/2017 16:05   Mr Jodene Nam Head/brain QI Cm  Result Date: 11/12/2017 CLINICAL DATA:  67 y/o  F; stroke for follow-up. EXAM: MRA HEAD WITHOUT CONTRAST TECHNIQUE: Angiographic images of the Circle of Willis were obtained using MRA technique without intravenous contrast. COMPARISON:  11/12/2017 MRI of the head. FINDINGS: Internal carotid arteries:  Patent. Anterior cerebral arteries:  Patent. Middle  cerebral arteries: Patent. Anterior communicating artery: Patent. Posterior communicating arteries:  Patent.  Fetal right PCA. Posterior cerebral arteries:  Patent. Basilar artery:  Patent. Vertebral arteries:  Patent. No evidence of significant stenosis, large vessel occlusion, or aneurysm. IMPRESSION: No large vessel occlusion, aneurysm, or significant stenosis is identified. Electronically Signed   By: Kristine Garbe M.D.   On: 11/12/2017 21:35   Patient seen and examined.  Clinical course and management discussed.  Necessary edits performed.  I agree with the above.  Assessment and plan of care developed and discussed below.    Assessment: 67 y.o. female  with history of diabetes mellitus, hyperlipidemia, hypertension, hypothyroidism, chronic constipation presenting with chief complaints of right side weakness and fall.  She reports progressive right side weakness since 2004, worse over the past 2 weeks . MRI of the brain showed diffuse signal abnormality in the distal left ACA territory consistent with subacute to chronic infarct when compared to old films though appears fairly stable. MRA head shows no large vessel occlusion, aneurysm, or significant stenosis.  Hemoglobin A1c 8.3, LDL 85.  Patient on aspirin 81 mg prior to this event. This does not appear to be an acute ischemic event.  Patient has had multiple falls though.  Would like to rule out lumbar spine abnormality.    Stroke Risk Factors - diabetes mellitus, family  history, hyperlipidemia and hypertension  Plan: 1. Prophylactic therapy-Antiplatelet med: Aspirin - dose 81 mg/day.  Continue. 2. PT consult, OT consult, Speech consult 3. Echocardiogram pending 4. Aggressive lipid management with target LDL<70. 5. Blood sugar management with target A1c<7.0 6. Telemetry monitoring 7. Frequent neuro checks 8. MRI of the lumbar spine 9. If above unremarkable patient to follow up with Dr. Manuella Ghazi (neurology) on an outpatient basis  This patient was staffed with Dr. Magda Paganini, Doy Mince who personally evaluated patient, reviewed documentation and agreed with assessment and plan of care as above.  Rufina Falco, DNP, FNP-BC Board certified Nurse Practitioner Neurology Department  11/13/2017, 9:35 AM  Alexis Goodell, MD Neurology [phone]  11/13/2017  2:17 PM

## 2017-11-13 NOTE — Progress Notes (Signed)
[employer] [facility] [place] was admitted to the [facility] on 11/12/2017 and Discharged  11/13/2017 and should be excused from work/school   For 7  days starting 11/12/2017 , may return to work/school without any restrictions.  Demetrios Loll M.D on 11/13/2017,at 8:53 AM  [place] [facility]  [phone]

## 2017-11-13 NOTE — Discharge Summary (Signed)
[facility] NAME: Mailyn Steichen    MR#:  [id number]  DATE OF BIRTH:  1950/04/22  DATE OF ADMISSION:  11/12/2017   ADMITTING PHYSICIAN: Epifanio Lesches, MD  DATE OF DISCHARGE: 11/13/2017  PRIMARY CARE PHYSICIAN: Seward Carol, MD   ADMISSION DIAGNOSIS:  Right leg weakness [R29.898] Ischemic stroke ([place]) [I63.9] DISCHARGE DIAGNOSIS:  Active Problems:   Acute ischemic left ACA stroke ([place])  SECONDARY DIAGNOSIS:   Past Medical History:  Diagnosis Date  . Constipation   . Diabetes mellitus   . GERD (gastroesophageal reflux disease)   . Goiter   . Hyperlipidemia   . Hypertension   . Hypothyroidism   . Premature menopause age 3  . Vitamin D deficiency 2009   HOSPITAL COURSE:  67 year old female patient with the risk factors of hypertension, diabetes, hypothyroidism, hyperlipidemia having a right leg weakness for 2 weeks, MRI of the brain showed subacute stroke in left ACA.  Assessment and plan Left ICA/CCA stroke Likely thrombotic stroke, , risk factors of hypertension, diabetes mellitus, hyperlipidemia patient is on aspirin at home but she does not take every day. Increase aspirin to 325 mg daily, increase Lipitor to 40 mg HS. MRA of brain: No large vessel occlusion, aneurysm, or significant stenosis is Identified. Echo is unremarkable.  Physical therapy evaluation suggest outpatient PT.  The patient has no complaints. 2.  Diabetes mellitus type 2: Continue home medication 3.  Essential hypertension, Benicar, amlodipine.  DISCHARGE CONDITIONS:  Stable, discharge to home with outpatient PT today. CONSULTS OBTAINED:  Treatment Team:  Alexis Goodell, MD DRUG ALLERGIES:  No Known Allergies DISCHARGE MEDICATIONS:   Allergies as of 11/13/2017   No Known Allergies     Medication List    STOP taking these medications   aspirin 81 MG tablet Replaced by:  aspirin 325 MG EC tablet     TAKE these medications    Alogliptin Benzoate 25 MG Tabs Take 1 tablet by mouth daily.   amLODipine 10 MG tablet Commonly known as:  NORVASC Take 1 tablet (10 mg total) by mouth daily.   aspirin 325 MG EC tablet Take 1 tablet (325 mg total) by mouth daily. Start taking on:  11/14/2017 Replaces:  aspirin 81 MG tablet   atorvastatin 40 MG tablet Commonly known as:  LIPITOR Take 1 tablet (40 mg total) by mouth daily at 6 PM. What changed:    medication strength  how much to take  when to take this   BAYER CONTOUR TEST test strip Generic drug:  glucose blood TEST BLOOD SUGARS 3 TIMES DAILY   CENTRUM SILVER PO Take 1 tablet by mouth daily.   cholecalciferol 1000 units tablet Commonly known as:  VITAMIN D Take 2,000 Units by mouth daily.   ibuprofen 600 MG tablet Commonly known as:  ADVIL,MOTRIN Take 600 mg by mouth every 8 (eight) hours as needed for pain.   levothyroxine 75 MCG tablet Commonly known as:  SYNTHROID, LEVOTHROID Take 75 mcg by mouth daily.   metFORMIN 1000 MG tablet Commonly known as:  GLUCOPHAGE take 1 tablet by mouth twice a day with food What changed:    how much to take  how to take this  when to take this  additional instructions   omeprazole 20 MG capsule Commonly known as:  PRILOSEC take 1 capsule by mouth once daily What changed:    how much to take  how to take this  when to take this  additional instructions   telmisartan-hydrochlorothiazide 80-25 MG tablet Commonly known as:  MICARDIS HCT Take 1 tablet by mouth daily.        DISCHARGE INSTRUCTIONS:  See AVS. If you experience worsening of your admission symptoms, develop shortness of breath, life threatening emergency, suicidal or homicidal thoughts you must seek medical attention immediately by calling 911 or calling your MD immediately  if symptoms less severe.  You Must read complete instructions/literature along with all the possible adverse reactions/side effects for all the Medicines  you take and that have been prescribed to you. Take any new Medicines after you have completely understood and accpet all the possible adverse reactions/side effects.   Please note  You were cared for by a hospitalist during your hospital stay. If you have any questions about your discharge medications or the care you received while you were in the hospital after you are discharged, you can call the unit and asked to speak with the hospitalist on call if the hospitalist that took care of you is not available. Once you are discharged, your primary care physician will handle any further medical issues. Please note that NO REFILLS for any discharge medications will be authorized once you are discharged, as it is imperative that you return to your primary care physician (or establish a relationship with a primary care physician if you do not have one) for your aftercare needs so that they can reassess your need for medications and monitor your lab values.    On the day of Discharge:  VITAL SIGNS:  Blood pressure 129/72, pulse 62, temperature 98.2 F (36.8 C), temperature source Oral, resp. rate 16, height 5\' 6"  (1.676 m), weight 92.8 kg, SpO2 100 %. PHYSICAL EXAMINATION:  GENERAL:  67 y.o.-year-old patient lying in the bed with no acute distress.  EYES: Pupils equal, round, reactive to light and accommodation. No scleral icterus. Extraocular muscles intact.  HEENT: Head atraumatic, normocephalic. Oropharynx and nasopharynx clear.  NECK:  Supple, no jugular venous distention. No thyroid enlargement, no tenderness.  LUNGS: Normal breath sounds bilaterally, no wheezing, rales,rhonchi or crepitation. No use of accessory muscles of respiration.  CARDIOVASCULAR: S1, S2 normal. No murmurs, rubs, or gallops.  ABDOMEN: Soft, non-tender, non-distended. Bowel sounds present. No organomegaly or mass.  EXTREMITIES: No pedal edema, cyanosis, or clubbing.  NEUROLOGIC: Cranial nerves II through XII are intact.  Muscle strength 5/5 in all extremities. Sensation intact. Gait not checked.  PSYCHIATRIC: The patient is alert and oriented x 3.  SKIN: No obvious rash, lesion, or ulcer.  DATA REVIEW:   CBC Recent Labs  Lab 11/12/17 1857  WBC 9.2  HGB 13.6  HCT 42.2  PLT 207    Chemistries  Recent Labs  Lab 11/12/17 1109 11/12/17 1857  NA 142  --   K 4.1  --   CL 105  --   CO2 29  --   GLUCOSE 213*  --   BUN 13  --   CREATININE 1.04* 0.73  CALCIUM 9.5  --      Microbiology Results  Results for orders placed or performed in visit on 06/27/14  Urine culture     Status: None   Collection Time: 06/27/14 12:50 PM  Result Value Ref Range Status   Colony Count 20,OOO COLONIES/ML  Final   Organism ID, Bacteria Multiple bacterial morphotypes present, none  Final   Organism ID, Bacteria predominant. Suggest appropriate recollection if   Final   Organism ID, Bacteria clinically indicated.  Final  RADIOLOGY:  Mr Brain Wo Contrast  Addendum Date: 11/13/2017   ADDENDUM REPORT: 11/13/2017 13:51 ADDENDUM: The multilocular cystic lesion in the left posterior pericallosal brain is stable in comparison with the prior MRIs of the head dated 09/28/2010, 05/05/2007, 11/23/2004, and 07/26/2003. Electronically Signed   By: Kristine Garbe M.D.   On: 11/13/2017 13:51   Result Date: 11/13/2017 CLINICAL DATA:  Right lower extremity weakness, abnormal cerebellar testing, focal neuro deficit, greater than 6 hours. EXAM: MRI HEAD WITHOUT CONTRAST TECHNIQUE: Multiplanar, multiecho pulse sequences of the brain and surrounding structures were obtained without intravenous contrast. COMPARISON:  None. FINDINGS: Brain: Subtle diffusion signal changes are present in the anteromedial frontal lobes bilaterally. There is also abnormal signal on the diffusion trace images in the posteromedial left frontal lobe, distal ACA territory. This mostly represents T2 shine through without significant restricted diffusion on  the ADC maps. Additional periventricular white matter changes bilaterally are moderately advanced for age. Ventricles are of normal size. Basal ganglia are within normal limits. The brainstem and cerebellum are normal. Vascular: Flow is present in the major intracranial arteries. Skull and upper cervical spine: Skull base is within normal limits. Craniocervical junction is normal. Sinuses/Orbits: The left maxillary sinus is near completely opacified. Mucosal thickening is present in the right maxillary sinus inferiorly. Nasal polyps are evident without nasal obstruction. Scattered ethmoid opacification is worse on the left. The left frontal sinus is opacified. The sphenoid sinuses are clear. Mastoid air cells are clear. Globes and orbits are within normal limits. IMPRESSION: 1. Diffusion signal abnormality in the distal left ACA territory. Abnormal signal in the posteromedial left frontal lobe is consistent with a subacute to chronic infarct. This corresponds to right lower extremity weakness. 2. Subtle anteromedial diffusion signal abnormality bilaterally may also reflect some degree of ischemia. 3. Diffuse white matter disease is moderately advanced for age. This likely reflects the sequela of chronic microvascular ischemia. Electronically Signed: By: San Morelle M.D. On: 11/12/2017 16:05   Mr Jodene Nam Head/brain HY Cm  Result Date: 11/12/2017 CLINICAL DATA:  67 y/o  F; stroke for follow-up. EXAM: MRA HEAD WITHOUT CONTRAST TECHNIQUE: Angiographic images of the Circle of Willis were obtained using MRA technique without intravenous contrast. COMPARISON:  11/12/2017 MRI of the head. FINDINGS: Internal carotid arteries:  Patent. Anterior cerebral arteries:  Patent. Middle cerebral arteries: Patent. Anterior communicating artery: Patent. Posterior communicating arteries:  Patent.  Fetal right PCA. Posterior cerebral arteries:  Patent. Basilar artery:  Patent. Vertebral arteries:  Patent. No evidence of  significant stenosis, large vessel occlusion, or aneurysm. IMPRESSION: No large vessel occlusion, aneurysm, or significant stenosis is identified. Electronically Signed   By: Kristine Garbe M.D.   On: 11/12/2017 21:35     Management plans discussed with the patient, family and they are in agreement.  CODE STATUS: Full Code   TOTAL TIME TAKING CARE OF THIS PATIENT: 27 minutes.    Demetrios Loll M.D on 11/13/2017 at 2:13 PM  Between 7am to 6pm - Pager - [phone]  After 6pm go to [email or web address] - Proofreader  [facility]  Office  [phone]  CC: Primary care physician; Seward Carol, MD   Note: This dictation was prepared with Dragon dictation along with smaller phrase technology. Any transcriptional errors that result from this process are unintentional.

## 2017-11-14 ENCOUNTER — Other Ambulatory Visit: Payer: Self-pay | Admitting: Internal Medicine

## 2017-11-14 ENCOUNTER — Other Ambulatory Visit: Payer: Self-pay | Admitting: Family Medicine

## 2017-11-14 LAB — HIV ANTIBODY (ROUTINE TESTING W REFLEX): HIV Screen 4th Generation wRfx: NONREACTIVE

## 2017-11-17 ENCOUNTER — Other Ambulatory Visit: Payer: Self-pay | Admitting: Internal Medicine

## 2017-11-17 DIAGNOSIS — R531 Weakness: Secondary | ICD-10-CM

## 2017-11-17 DIAGNOSIS — R9089 Other abnormal findings on diagnostic imaging of central nervous system: Secondary | ICD-10-CM

## 2017-11-18 ENCOUNTER — Other Ambulatory Visit: Payer: Self-pay | Admitting: Internal Medicine

## 2017-11-18 DIAGNOSIS — R29898 Other symptoms and signs involving the musculoskeletal system: Secondary | ICD-10-CM

## 2017-11-19 ENCOUNTER — Encounter: Payer: Self-pay | Admitting: Neurology

## 2017-11-23 ENCOUNTER — Ambulatory Visit
Admission: RE | Admit: 2017-11-23 | Discharge: 2017-11-23 | Disposition: A | Discharge status: Home / Self Care | Payer: No Typology Code available for payment source | Source: Ambulatory Visit | Attending: Internal Medicine | Admitting: Internal Medicine

## 2017-11-23 DIAGNOSIS — R29898 Other symptoms and signs involving the musculoskeletal system: Secondary | ICD-10-CM

## 2017-11-23 IMAGING — MR MR LUMBAR SPINE W/O CM
4 of 5 series · 25 of 48 positions shown · non-contrast
Comparison: Abdominal radiographs 02/14/2010. Rib radiographs
11/07/2017.

CLINICAL DATA: Right leg pain and weakness for 3 weeks.

EXAM:
MRI LUMBAR SPINE WITHOUT CONTRAST
TECHNIQUE: Multiplanar, multisequence MR imaging of the lumbar spine was
performed. No intravenous contrast was administered.

[Series 2: T1 · sagittal · 4.0mm · 0.81mm/px · 6 of 16 slices shown (1 of 2)]
[im 1/16]
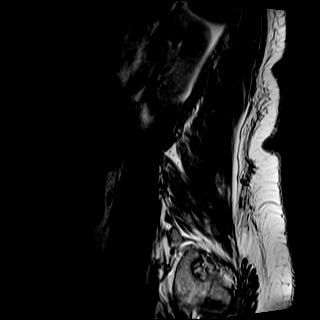
[im 4/16]
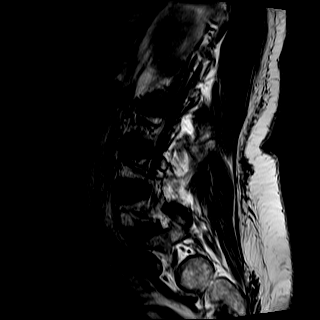
[im 7/16]
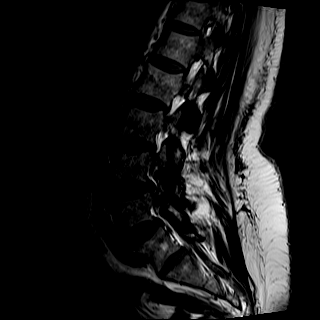
[im 10/16]
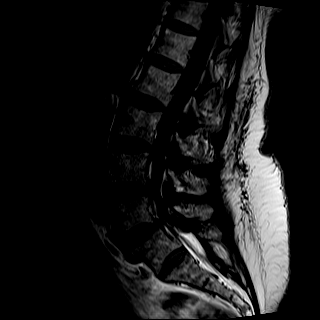
[im 13/16]
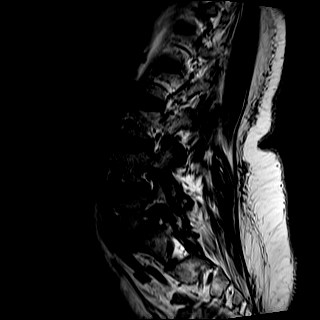
[im 16/16]
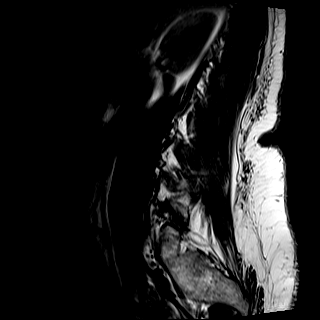

[Series 3: T2 · sagittal · 4.0mm · 0.81mm/px · 6 of 16 slices shown (1 of 2)]
[im 1/16]
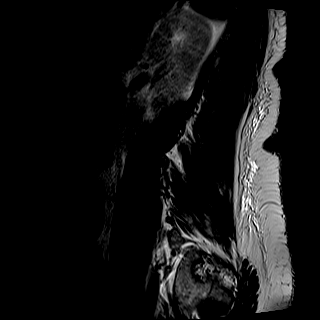
[im 4/16]
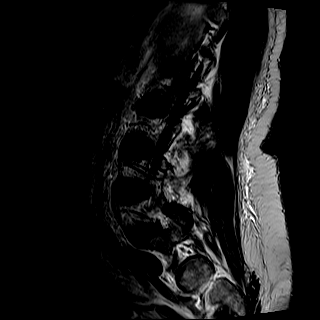
[im 7/16]
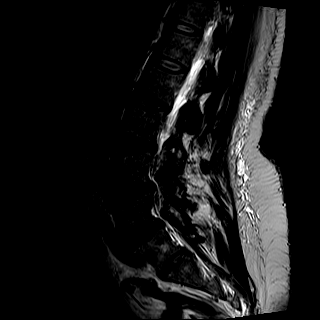
[im 10/16]
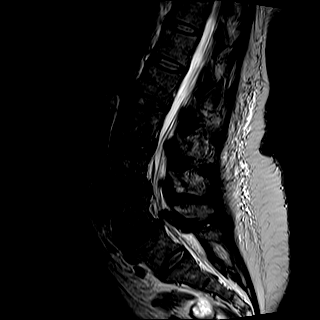
[im 13/16]
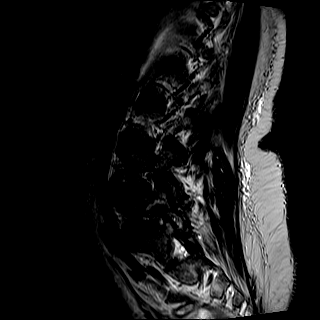
[im 16/16]
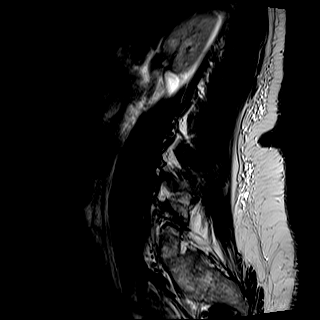

[Series 5: T2 · axial · 4.0mm · 0.39mm/px · z∈[-61,+143]mm · 9 of 42 slices shown (2 of 2)]
[im 1/42]
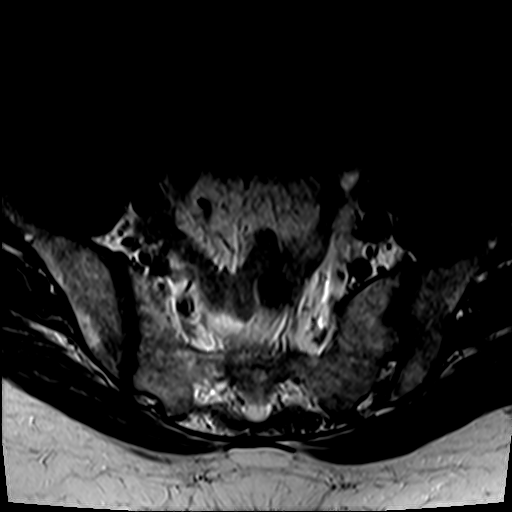
[im 6/42]
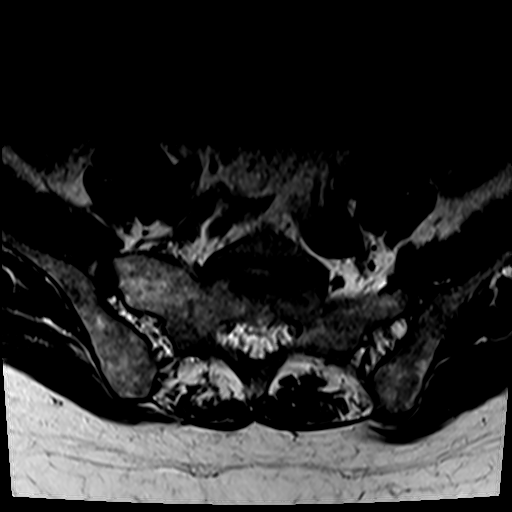
[im 12/42]
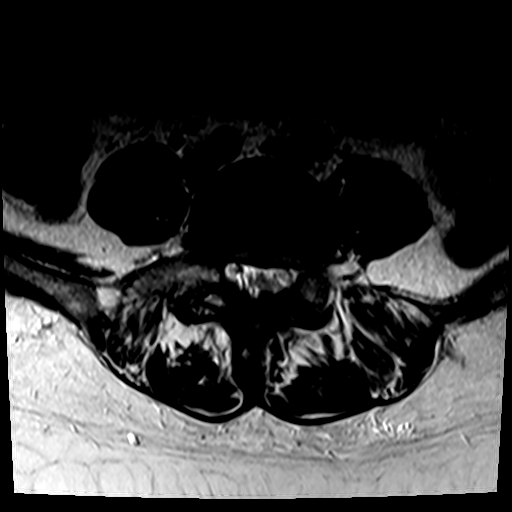
[im 18/42]
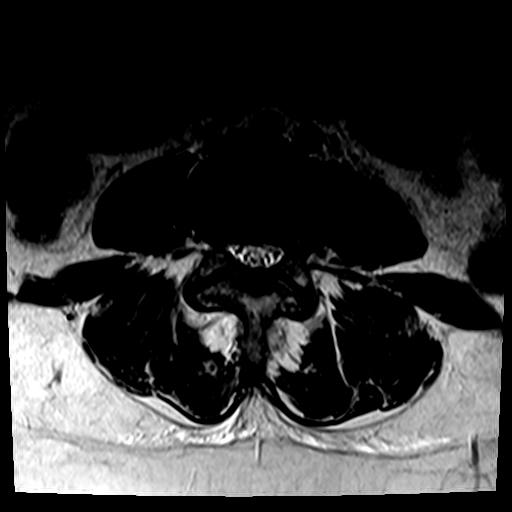
[im 21/42]
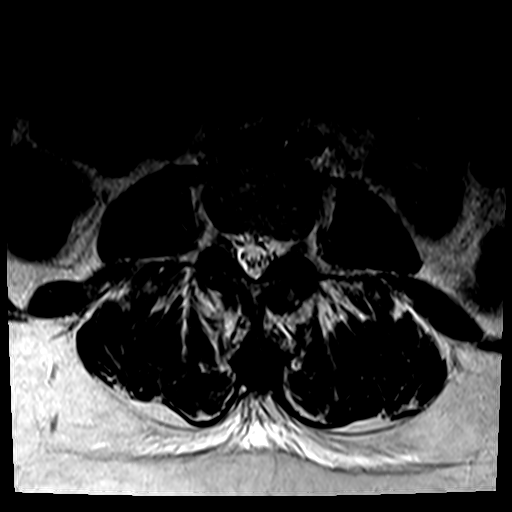
[im 24/42]
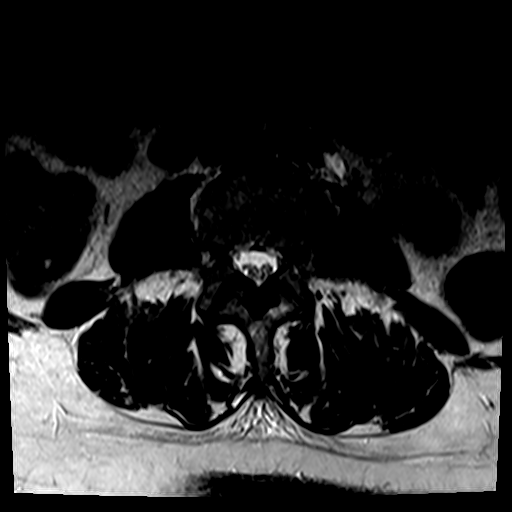
[im 30/42]
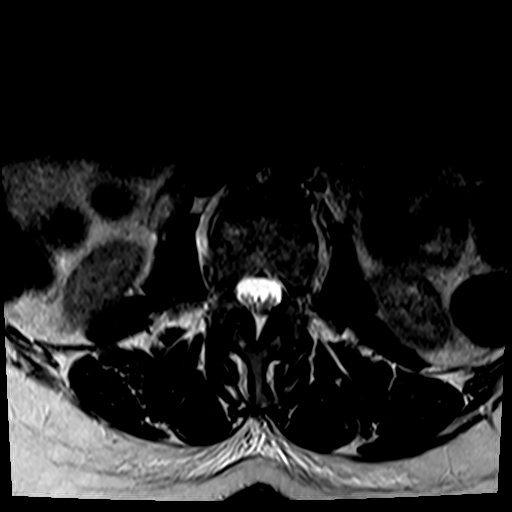
[im 36/42]
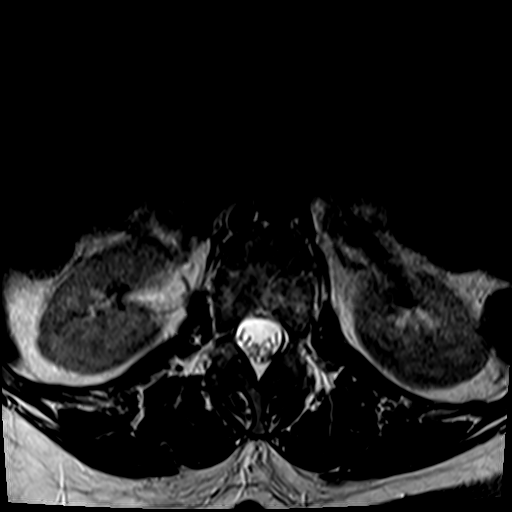
[im 42/42]
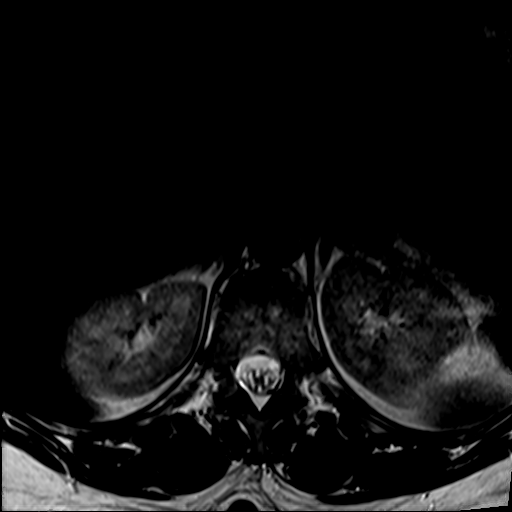

[Series 6: T1 · axial · 4.0mm · 0.39mm/px · z∈[-61,+113]mm · 4 of 42 slices shown (2 of 2)]
[im 1/42]
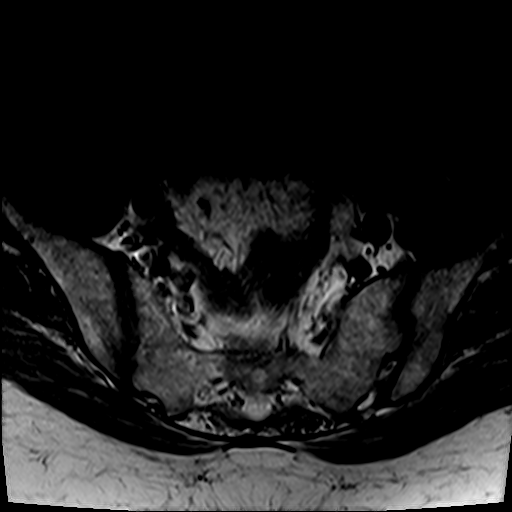
[im 6/42]
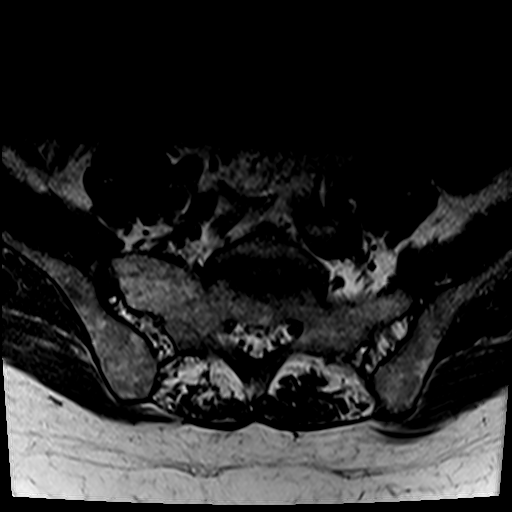
[im 21/42]
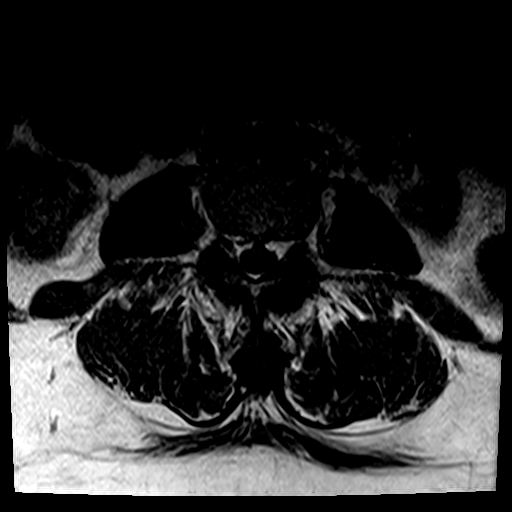
[im 36/42]
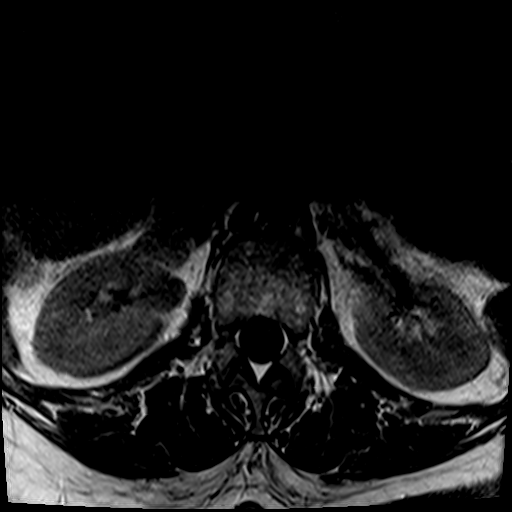

[25 of 48 positions shown; findings below may reference images not displayed]

FINDINGS: Segmentation: Comparison radiographs demonstrate 11 full sized pairs
of ribs with absent or only rudimentary ribs at T12. L5 is partially
sacralized with a hypoplastic L5-S1 disc.

Alignment: Exaggerated lumbar lordosis. Trace anterolisthesis of L4
on L5.

Vertebrae: No fracture or suspicious osseous lesion. Mild facet
edema at L3-4 and L4-5.

Conus medullaris and cauda equina: Conus extends to the T12 level.
Conus and cauda equina appear normal.

Paraspinal and other soft tissues: Unremarkable.

Disc levels:

Lumbar disc desiccation greatest at L3-4 and L4-5. No significant
disc space height loss.

T12-L1: Negative.

L1-2: Mild facet and ligamentum flavum hypertrophy without disc
herniation or stenosis.

L2-3: Mild disc bulging and moderate facet and ligamentum flavum
hypertrophy result in borderline spinal stenosis without neural
foraminal stenosis.

L3-4: Mild circumferential disc bulging and moderate facet and
ligamentum flavum hypertrophy result in mild spinal stenosis and
mild bilateral neural foraminal stenosis.

L4-5: Disc bulging, small superimposed central/left central disc
protrusion, and severe facet and ligamentum flavum hypertrophy
result in severe spinal stenosis, asymmetric left lateral recess
stenosis, and severe right and moderate left neural foraminal
stenosis. A right facet joint effusion is noted, and a 4 mm synovial
cyst projects into the superior aspect of the right neural foramen
contributing to the foraminal stenosis and right L4 nerve root
impingement.

L5-S1: Mild facet arthrosis without stenosis.
IMPRESSION: 1. Transitional lumbosacral anatomy as above.
2. Severe L4-5 facet arthrosis with slight anterolisthesis, severe
spinal stenosis, and severe right and moderate left neural foraminal
stenosis.
3. Mild spinal and neural foraminal stenosis at L3-4.

## 2017-12-15 ENCOUNTER — Emergency Department (HOSPITAL_COMMUNITY)
Admission: EM | Admit: 2017-12-15 | Discharge: 2017-12-16 | Disposition: A | Discharge status: Home / Self Care | Payer: No Typology Code available for payment source | Attending: Emergency Medicine | Admitting: Emergency Medicine

## 2017-12-15 ENCOUNTER — Emergency Department (HOSPITAL_COMMUNITY): Payer: No Typology Code available for payment source

## 2017-12-15 ENCOUNTER — Other Ambulatory Visit: Payer: Self-pay

## 2017-12-15 DIAGNOSIS — M79651 Pain in right thigh: Secondary | ICD-10-CM | POA: Insufficient documentation

## 2017-12-15 DIAGNOSIS — Y9389 Activity, other specified: Secondary | ICD-10-CM | POA: Insufficient documentation

## 2017-12-15 DIAGNOSIS — M79604 Pain in right leg: Secondary | ICD-10-CM

## 2017-12-15 DIAGNOSIS — E039 Hypothyroidism, unspecified: Secondary | ICD-10-CM | POA: Insufficient documentation

## 2017-12-15 DIAGNOSIS — E119 Type 2 diabetes mellitus without complications: Secondary | ICD-10-CM | POA: Insufficient documentation

## 2017-12-15 DIAGNOSIS — X509XXA Other and unspecified overexertion or strenuous movements or postures, initial encounter: Secondary | ICD-10-CM | POA: Insufficient documentation

## 2017-12-15 DIAGNOSIS — Y999 Unspecified external cause status: Secondary | ICD-10-CM | POA: Insufficient documentation

## 2017-12-15 DIAGNOSIS — Y92003 Bedroom of unspecified non-institutional (private) residence as the place of occurrence of the external cause: Secondary | ICD-10-CM | POA: Diagnosis not present

## 2017-12-15 DIAGNOSIS — I10 Essential (primary) hypertension: Secondary | ICD-10-CM | POA: Insufficient documentation

## 2017-12-15 IMAGING — DX DG PELVIS 1-2V
1 series · 1 of 1 positions shown · non-contrast
Comparison: Right femur radiographs obtained at the same time.

CLINICAL DATA: Chronic right leg and groin pain.

EXAM:
PELVIS - 1-2 VIEW

[pelvis ap]
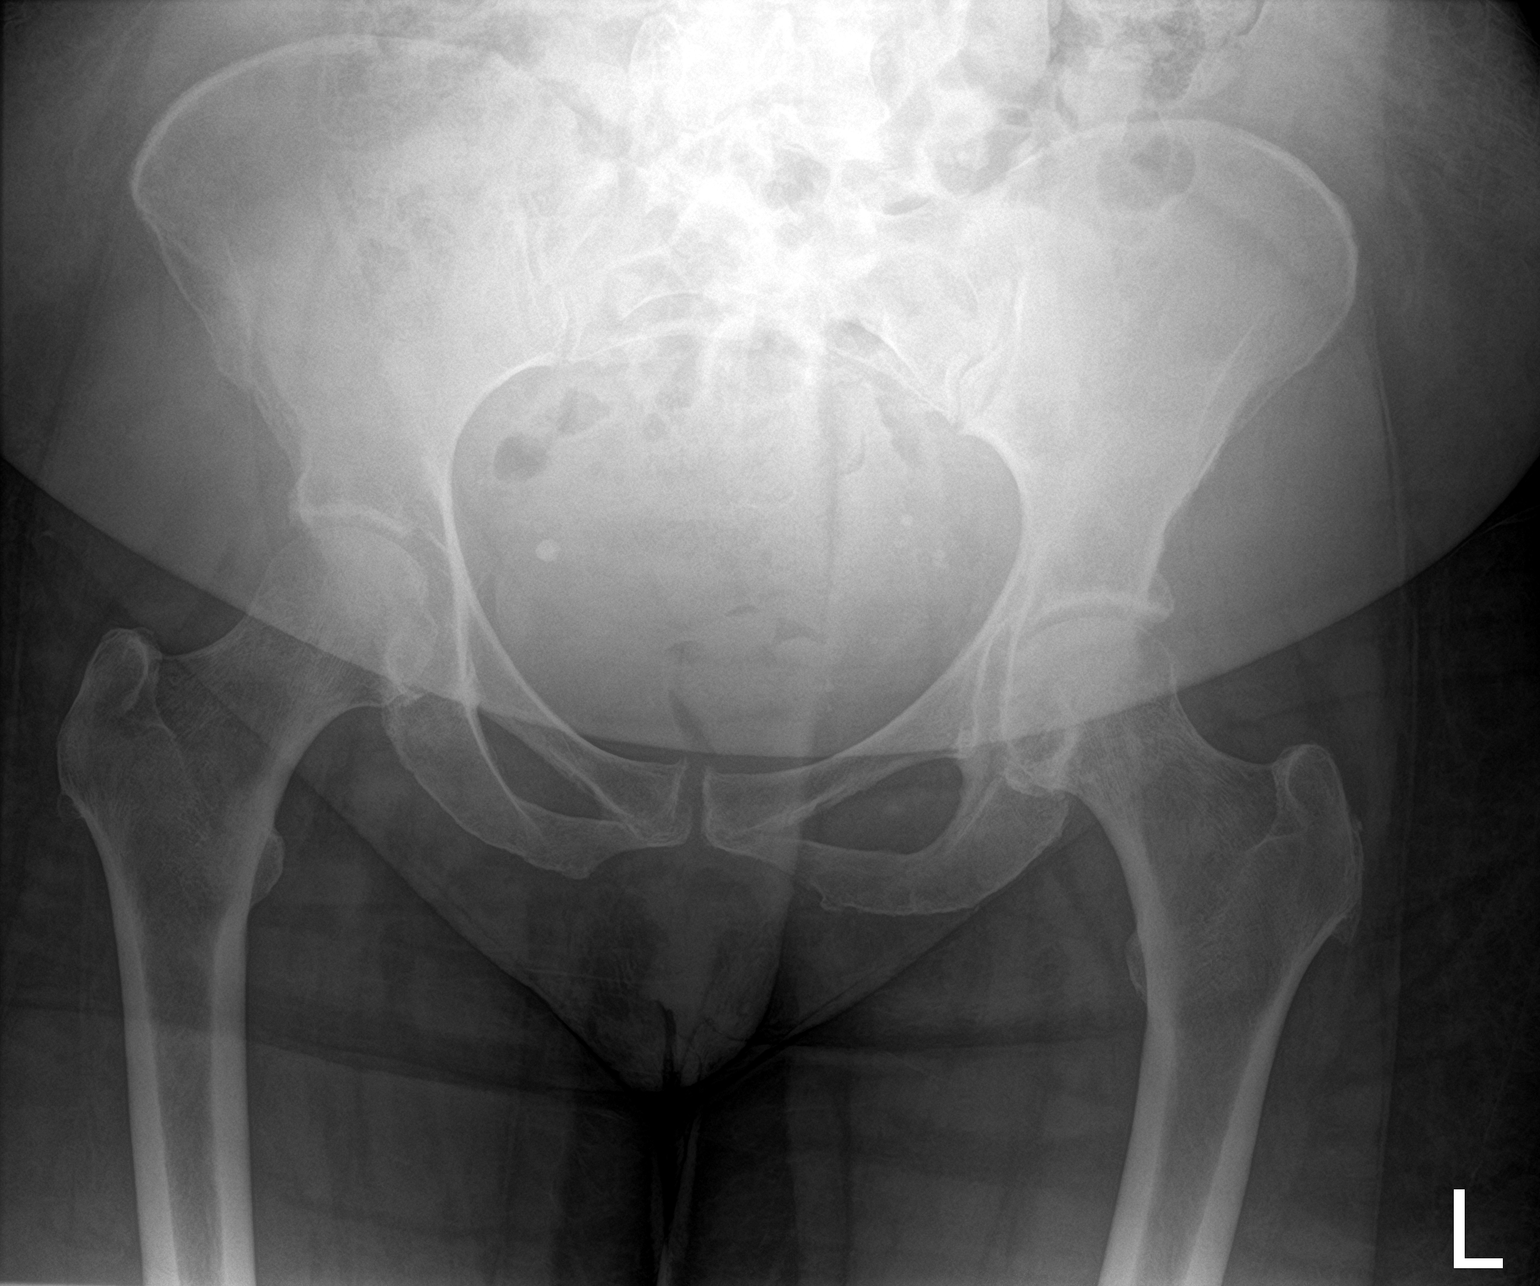

[1 of 1 positions shown; findings below may reference images not displayed]

FINDINGS: Normal appearing pelvic bones. Bilateral pelvic phleboliths.
IMPRESSION: Normal examination.

## 2017-12-15 MED ORDER — METHOCARBAMOL 500 MG PO TABS
1000.0000 mg | ORAL_TABLET | Freq: Once | ORAL | Status: AC
  Administered 2017-12-16: 1000 mg via ORAL
  Filled 2017-12-15: qty 2

## 2017-12-15 MED ORDER — HYDROMORPHONE HCL 1 MG/ML IJ SOLN
0.5000 mg | Freq: Once | INTRAMUSCULAR | Status: AC
  Administered 2017-12-15: 0.5 mg via SUBCUTANEOUS
  Filled 2017-12-15: qty 1

## 2017-12-15 NOTE — ED Triage Notes (Signed)
Pt complaining of right leg and groin pain. Pain is chronic for her. Saw neurologist today but said she did not feel like her pain was addressed. Pt took a few steps in order to get on EMS stretcher

## 2017-12-15 NOTE — ED Notes (Signed)
Pt. To xray via stretcher

## 2017-12-15 NOTE — ED Provider Notes (Signed)
[facility] Emergency Department Provider Note MRN:  [id number]  Arrival date & time: 12/16/17     Chief Complaint   Leg Pain   History of Present Illness   Ann Weiss is a 67 y.o. year-old female with a history of diabetes, stroke presenting to the ED with chief complaint of leg pain.  Patient has had several weeks of right leg pain and stiffness, ever since her stroke diagnosis last month.  3 hours ago, she was rolling over in bed and experienced a popping or tearing sensation in her right groin and or upper thigh.  The pain is sharp, constant, severe, worse with motion.  Denies recent fever, no chest pain or shortness of breath, no abdominal pain, no new numbness or weakness.  Has had 3 falls in the past month.  Review of Systems  A complete 10 system review of systems was obtained and all systems are negative except as noted in the HPI and PMH.   Patient's Health History    Past Medical History:  Diagnosis Date  . Constipation   . Diabetes mellitus   . GERD (gastroesophageal reflux disease)   . Goiter   . Hyperlipidemia   . Hypertension   . Hypothyroidism   . Premature menopause age 52  . Vitamin D deficiency 2009    Past Surgical History:  Procedure Laterality Date  . CESAREAN SECTION     x 2  . COLONOSCOPY  [id number]  . ESOPHAGOGASTRODUODENOSCOPY  [id number]  . FOOT SURGERY Right 01/2013   bone spur  . TUBAL LIGATION      Family History  Problem Relation Age of Onset  . Hypertension Mother   . Kidney disease Mother        renal failure, dialysis  . Hypertension Father   . Stroke Father   . Arthritis Maternal Aunt   . Heart disease Maternal Aunt   . Diabetes Maternal Grandmother   . Cancer Neg Hx   . Breast cancer Neg Hx   . Colon cancer Neg Hx     Social History   Socioeconomic History  . Marital status: Married    Spouse name: Not on file  . Number of children: Not on file  . Years of education: Not on file  . Highest education  level: Not on file  Occupational History  . Occupation: Retired from [employer]: RETIRED  Social Needs  . Financial resource strain: Not on file  . Food insecurity:    Worry: Not on file    Inability: Not on file  . Transportation needs:    Medical: Not on file    Non-medical: Not on file  Tobacco Use  . Smoking status: Never Smoker  . Smokeless tobacco: Never Used  Substance and Sexual Activity  . Alcohol use: Not Currently    Comment: socially maybe 1-2 times monthly  . Drug use: No  . Sexual activity: Not Currently  Lifestyle  . Physical activity:    Days per week: Not on file    Minutes per session: Not on file  . Stress: Not on file  Relationships  . Social connections:    Talks on phone: Not on file    Gets together: Not on file    Attends religious service: Not on file    Active member of club or organization: Not on file    Attends meetings of clubs or organizations: Not on file    Relationship status: Not on file  .  Intimate partner violence:    Fear of current or ex partner: Not on file    Emotionally abused: Not on file    Physically abused: Not on file    Forced sexual activity: Not on file  Other Topics Concern  . Not on file  Social History Narrative   Lives alone.  Daughter lives in [place] and other daughter lives in [place], [place]. 4 grandchildren     Physical Exam  Vital Signs and Nursing Notes reviewed Vitals:   12/15/17 2147 12/15/17 2230  BP: (!) 151/78 (!) 155/61  Pulse: 77 76  Resp: 18   Temp: (!) 97.5 F (36.4 C)   SpO2: 97% 96%    CONSTITUTIONAL: Well-appearing, NAD NEURO:  Alert and oriented x 3, no focal deficits EYES:  eyes equal and reactive ENT/NECK:  no LAD, no JVD CARDIO: Regular rate, well-perfused, normal S1 and S2 PULM:  CTAB no wheezing or rhonchi GI/GU:  normal bowel sounds, non-distended, non-tender MSK/SPINE:  No gross deformities, no edema SKIN:  no rash, atraumatic; tenderness to palpation to the right upper thigh  with tender medial nodule PSYCH:  Appropriate speech and behavior  Diagnostic and Interventional Summary    Labs Reviewed - No data to display  DG Pelvis 1-2 Views  Final Result    DG Femur Min 2 Views Right  Final Result      Medications  acetaminophen (TYLENOL) tablet 650 mg (has no administration in time range)  HYDROmorphone (DILAUDID) injection 0.5 mg (0.5 mg Subcutaneous Given 12/15/17 2235)  methocarbamol (ROBAXIN) tablet 1,000 mg (1,000 mg Oral Given 12/16/17 0011)     Procedures  EMERGENCY DEPARTMENT US SOFT TISSUE INTERPRETATION "Study: Limited Soft Tissue Ultrasound"  INDICATIONS: Pain Multiple views of the body part were obtained in real-time with a multi-frequency linear probe  PERFORMED BY: Myself IMAGES ARCHIVED?: Yes SIDE:Right  BODY PART:Lower extremity INTERPRETATION:  No abcess noted and No cellulitis noted  EMERGENCY DEPARTMENT US EXTREMITY EXAM "Study:  Limited Duplex of Right Lower Extremity Veins"  INDICATIONS: Leg pain Visualization of  regions in transverse plane with full compression visualized.   PERFORMED BY: Myself IMAGES ARCHIVED?: Yes VIEWS USED: Saphenous-femoral junction, Proximal femoral vein and Popliteal vein INTERPRETATION: No DVT visualized  Critical Care  ED Course and Medical Decision Making  I have reviewed the triage vital signs and the nursing notes.  Pertinent labs & imaging results that were available during my care of the patient were reviewed by me and considered in my medical decision making (see below for details).  Question of muscle spasm/strain/partial tear in this 67 year old female with acute on chronic pain.  Vital signs stable, no other complaints, recent falls so will screen with x-rays.  Will perform bedside ultrasound of the palpated nodule as well as the adjacent vasculature to exclude abscess or DVT.  X-rays and ultrasounds unrevealing.  Upon closer palpation, there is not a tender nodule but a tender  strip of what feels like musculature of the right medial thigh, likely the gracilis.  Favored muscle strain or spasm, with no erythema to suggest cellulitis.  Normal range of motion of the hip and knee, nothing to suggest septic joint.  Attempting symptomatic control and discharge.  Signed out to Dr. Leonides Schanz at shift change.  Barth Kirks. Sedonia Small, [place] [email or web address]  Final Clinical Impressions(s) / ED Diagnoses     ICD-10-CM   1. Pain of right lower extremity M79.604     ED Discharge  Orders         Ordered    methocarbamol (ROBAXIN) 500 MG tablet  Every 8 hours PRN     12/16/17 0036             Maudie Flakes, MD 12/16/17 [phone]

## 2017-12-16 MED ORDER — METHOCARBAMOL 500 MG PO TABS: 500.0000 mg | ORAL_TABLET | Freq: Three times a day (TID) | ORAL | 0 refills | Status: DC | PRN

## 2017-12-16 MED ORDER — ACETAMINOPHEN 325 MG PO TABS
650.0000 mg | ORAL_TABLET | Freq: Once | ORAL | Status: AC
  Administered 2017-12-16: 650 mg via ORAL
  Filled 2017-12-16: qty 2

## 2017-12-16 NOTE — Discharge Instructions (Addendum)
You were evaluated in the Emergency Department and after careful evaluation, we did not find any emergent condition requiring admission or further testing in the hospital.  Your symptoms today seem to be due to muscle spasm or strain.  Can use the muscle relaxers provided as needed for pain at home.  I also encouraged Tylenol, 1000 mg every 6 hours at home.  If the pain is not improved in 2 weeks, it would be important to discuss it further with your primary care provider.  Please return to the Emergency Department if you experience any worsening of your condition.  We encourage you to follow up with a primary care provider.  Thank you for allowing [place] to be a part of your care.

## 2017-12-16 NOTE — ED Notes (Signed)
Pt ambulated with walker. Pt. Had steady gait.

## 2017-12-16 NOTE — ED Provider Notes (Signed)
12:00 AM  Assumed care from Dr. Sedonia Small.  Patient is a 67 year old female who presented to the emergency department with acute onset right leg pain.  Felt a pop while rolling over in bed.  X-rays unremarkable.  Bedside ultrasound showed no DVT.  Likely muscle strain, spasm.  Patient received Tylenol, Dilaudid, Robaxin.  Will need to be reassessed and ambulated in the emergency department prior to discharge.  1:55 AM  Pt able to ambulate with a walker.  She has a cane at home.  She feels the muscle relaxers helped her significantly.  Will discharge home with prescription of Robaxin.  Husband who is here to drive her home.  Has PCP for follow-up.  At this time, I do not feel there is any life-threatening condition present. I have reviewed and discussed all results (EKG, imaging, lab, urine as appropriate) and exam findings with patient/family. I have reviewed nursing notes and appropriate previous records.  I feel the patient is safe to be discharged home without further emergent workup and can continue workup as an outpatient as needed. Discussed usual and customary return precautions. Patient/family verbalize understanding and are comfortable with this plan.  Outpatient follow-up has been provided as needed. All questions have been answered.    Ward, Delice Bison, DO 12/16/17 0157

## 2018-01-30 NOTE — Progress Notes (Signed)
NEUROLOGY CONSULTATION NOTE  Ann Weiss MRN: [id number] DOB: Aug 19, 1950  Referring provider: Seward Carol, MD Primary care provider: Seward Carol, MD  Reason for consult:  Stroke  HISTORY OF PRESENT ILLNESS: Ann Weiss is a 68 year old left-handed woman with hypertension, hyperlipidemia, diabetes, and goiter who presents for recent stroke.  History supplemented by hospital, ED, and referring provider notes.  She was admitted to [facility] from 11/12/2017 to 11/13/2017 for stroke.  She had a 2-week history of dragging the right foot.  On arrival, blood pressure was 169/83.  MRI of the brain was personally reviewed and demonstrated a subacute infarct in the left ACA territory.  MRA of the brain revealed no significant large vessel stenosis, occlusion, or aneurysm.  Echocardiogram demonstrated LVEF 55% - 65% but bubble study appeared not be performed.  Hgb A1c was 8.3 and LDL was 85.  At discharge, her aspirin was increased from 81 mg to 325 mg daily and Lipitor increased from 20 mg to 40 mg daily.  Following discharge, she developed right leg pain and stiffness.  An MRI of the lumbar spine from 11/23/2017 was personally reviewed and demonstrated severe L4-L5 facet arthrosis with severe spinal stenosis and severe right neural foraminal stenosis.  When rolling over in bed, she experienced a popping or tearing sensation in her right groin or upper thigh.  She presented to the ED at [facility] on 12/15/2017 for further evaluation.  X-ray of right femur and pelvis was unremarkable.  She was diagnosed with muscle strain and discharged on Robaxin.  She has been seen by neurosurgery who has chosen conservative management first before considering surgery.  She is currently in PT and notes only slight improvement in the right leg weakness.  She still drags it  PAST MEDICAL HISTORY: Past Medical History:  Diagnosis Date  . Constipation   . Diabetes mellitus   . GERD  (gastroesophageal reflux disease)   . Goiter   . Hyperlipidemia   . Hypertension   . Hypothyroidism   . Premature menopause age 33  . Vitamin D deficiency 2009    PAST SURGICAL HISTORY: Past Surgical History:  Procedure Laterality Date  . CESAREAN SECTION     x 2  . COLONOSCOPY  [id number]  . ESOPHAGOGASTRODUODENOSCOPY  [id number]  . FOOT SURGERY Right 01/2013   bone spur  . TUBAL LIGATION      MEDICATIONS: Current Outpatient Medications on File Prior to Visit  Medication Sig Dispense Refill  . Alogliptin Benzoate 25 MG TABS Take 1 tablet by mouth daily. 30 tablet 4  . amLODipine (NORVASC) 10 MG tablet Take 1 tablet (10 mg total) by mouth daily. 30 tablet 5  . aspirin EC 325 MG EC tablet Take 1 tablet (325 mg total) by mouth daily. 30 tablet 2  . atorvastatin (LIPITOR) 40 MG tablet Take 1 tablet (40 mg total) by mouth daily at 6 PM. 30 tablet 2  . BAYER CONTOUR TEST test strip TEST BLOOD SUGARS 3 TIMES DAILY 100 each 2  . cholecalciferol (VITAMIN D) 1000 UNITS tablet Take 2,000 Units by mouth daily.    Marland Kitchen ibuprofen (ADVIL,MOTRIN) 600 MG tablet Take 600 mg by mouth every 8 (eight) hours as needed for pain.    Marland Kitchen levothyroxine (SYNTHROID, LEVOTHROID) 75 MCG tablet Take 75 mcg by mouth daily.  3  . metFORMIN (GLUCOPHAGE) 1000 MG tablet take 1 tablet by mouth twice a day with food (Patient taking differently: Take 1,000 mg by mouth daily. ) 60  tablet 5  . methocarbamol (ROBAXIN) 500 MG tablet Take 1 tablet (500 mg total) by mouth every 8 (eight) hours as needed for muscle spasms. 30 tablet 0  . Multiple Vitamins-Minerals (CENTRUM SILVER PO) Take 1 tablet by mouth daily.      Marland Kitchen omeprazole (PRILOSEC) 20 MG capsule take 1 capsule by mouth once daily (Patient taking differently: Take 20 mg by mouth daily. ) 30 capsule 11  . telmisartan-hydrochlorothiazide (MICARDIS HCT) 80-25 MG tablet Take 1 tablet by mouth daily.  6   No current facility-administered medications on file prior to visit.      ALLERGIES: No Known Allergies  FAMILY HISTORY: Family History  Problem Relation Age of Onset  . Hypertension Mother   . Kidney disease Mother        renal failure, dialysis  . Hypertension Father   . Stroke Father   . Arthritis Maternal Aunt   . Heart disease Maternal Aunt   . Diabetes Maternal Grandmother   . Cancer Neg Hx   . Breast cancer Neg Hx   . Colon cancer Neg Hx    SOCIAL HISTORY: Social History   Socioeconomic History  . Marital status: Married    Spouse name: Not on file  . Number of children: Not on file  . Years of education: Not on file  . Highest education level: Not on file  Occupational History  . Occupation: Retired from [employer]: RETIRED  Social Needs  . Financial resource strain: Not on file  . Food insecurity:    Worry: Not on file    Inability: Not on file  . Transportation needs:    Medical: Not on file    Non-medical: Not on file  Tobacco Use  . Smoking status: Never Smoker  . Smokeless tobacco: Never Used  Substance and Sexual Activity  . Alcohol use: Not Currently    Comment: socially maybe 1-2 times monthly  . Drug use: No  . Sexual activity: Not Currently  Lifestyle  . Physical activity:    Days per week: Not on file    Minutes per session: Not on file  . Stress: Not on file  Relationships  . Social connections:    Talks on phone: Not on file    Gets together: Not on file    Attends religious service: Not on file    Active member of club or organization: Not on file    Attends meetings of clubs or organizations: Not on file    Relationship status: Not on file  . Intimate partner violence:    Fear of current or ex partner: Not on file    Emotionally abused: Not on file    Physically abused: Not on file    Forced sexual activity: Not on file  Other Topics Concern  . Not on file  Social History Narrative   Lives alone.  Daughter lives in [place] and other daughter lives in [place], [place]. 4 grandchildren    REVIEW OF  SYSTEMS: Constitutional: No fevers, chills, or sweats, no generalized fatigue, change in appetite Eyes: No visual changes, double vision, eye pain Ear, nose and throat: No hearing loss, ear pain, nasal congestion, sore throat Cardiovascular: No chest pain, palpitations Respiratory:  No shortness of breath at rest or with exertion, wheezes GastrointestinaI: No nausea, vomiting, diarrhea, abdominal pain, fecal incontinence Genitourinary:  No dysuria, urinary retention or frequency Musculoskeletal:  No neck pain, back pain Integumentary: No rash, pruritus, skin lesions Neurological: as above  Psychiatric: No depression, insomnia, anxiety Endocrine: No palpitations, fatigue, diaphoresis, mood swings, change in appetite, change in weight, increased thirst Hematologic/Lymphatic:  No purpura, petechiae. Allergic/Immunologic: no itchy/runny eyes, nasal congestion, recent allergic reactions, rashes  PHYSICAL EXAM: Blood pressure 130/80, pulse 80, height 5\' 6"  (1.676 m), weight 203 lb (92.1 kg), SpO2 98 %. General: No acute distress.  Patient appears well-groomed.   Head:  Normocephalic/atraumatic Eyes:  fundi examined but not visualized Neck: supple, no paraspinal tenderness, full range of motion Back: No paraspinal tenderness Heart: regular rate and rhythm Lungs: Clear to auscultation bilaterally. Vascular: No carotid bruits. Neurological Exam: Mental status: alert and oriented to person, place, and time, recent and remote memory intact, fund of knowledge intact, attention and concentration intact, speech fluent and not dysarthric, language intact. Cranial nerves: CN I: not tested CN II: pupils equal, round and reactive to light, visual fields intact CN III, IV, VI:  full range of motion, no nystagmus, no ptosis CN V: facial sensation intact CN VII: upper and lower face symmetric CN VIII: hearing intact CN IX, X: gag intact, uvula midline CN XI: sternocleidomastoid and trapezius muscles  intact CN XII: tongue midline Bulk & Tone: normal, no fasciculations. Motor:  5/5 throughout  Sensation:  Pinprick and vibration sensation intact. Deep Tendon Reflexes:  2+ throughout, toes downgoing.   Finger to nose testing:  Without dysmetria.   Heel to shin:  Without dysmetria.   Gait: Drags right leg.  Able to turn, cannot tandem walk. Romberg negative.  IMPRESSION: 1.  Right leg weakness.  Unclear if secondary to stroke or L5 radiculopathy.  Given isolation of ankle weakness, would more likely consider L5 radiculopathy as the primary cause. 2.  Left ACA territory infarct, unclear etiology 3.  HLD 4.  HTN 5.  Type 2 diabetes mellitus  PLAN: 1.  Prior to hospitalization, she was not taking ASA daily.  Therefore, she may return to 81mg  ASA with instruction to take it daily 2.  Check carotid ultrasound.  A carotid stenosis would likely involve the MCA as well but I would still want to evaluate given that she has cerebrovascular disease. 3.  Continue atorvastatin 40mg  daily.  LDL goal less than 70 4.  Continue glycemic control 5.  Follow up with neurosurgery 6.  Follow up in 6 months.  Thank you for allowing me to take part in the care of this patient.  Metta Clines, DO  CC: Seward Carol, MD

## 2018-02-02 ENCOUNTER — Encounter: Payer: Self-pay | Admitting: Neurology

## 2018-02-02 ENCOUNTER — Ambulatory Visit: Payer: No Typology Code available for payment source | Admitting: Neurology

## 2018-02-02 VITALS — BP 130/80 | HR 80 | Ht 66.0 in | Wt 203.0 lb

## 2018-02-02 DIAGNOSIS — M5417 Radiculopathy, lumbosacral region: Secondary | ICD-10-CM | POA: Diagnosis not present

## 2018-02-02 DIAGNOSIS — I63522 Cerebral infarction due to unspecified occlusion or stenosis of left anterior cerebral artery: Secondary | ICD-10-CM | POA: Diagnosis not present

## 2018-02-02 DIAGNOSIS — I10 Essential (primary) hypertension: Secondary | ICD-10-CM

## 2018-02-02 DIAGNOSIS — R29898 Other symptoms and signs involving the musculoskeletal system: Secondary | ICD-10-CM

## 2018-02-02 NOTE — Patient Instructions (Addendum)
1.  Take aspirin 81mg  daily 2.  Continue atorvastatin 40mg  daily 3.  Continue blood pressure and diabetic medications 4.  We will check carotid doppler 5.  Follow up in 6 months.  We have sent a referral to [facility] for your carotid doppler and they will call you directly to schedule your appt. They are located at [place]. If you need to contact them directly please call [phone].

## 2018-02-13 ENCOUNTER — Ambulatory Visit
Admission: RE | Admit: 2018-02-13 | Discharge: 2018-02-13 | Disposition: A | Discharge status: Home / Self Care | Payer: No Typology Code available for payment source | Source: Ambulatory Visit | Attending: Neurology | Admitting: Neurology

## 2018-02-13 DIAGNOSIS — I63522 Cerebral infarction due to unspecified occlusion or stenosis of left anterior cerebral artery: Secondary | ICD-10-CM

## 2018-02-17 ENCOUNTER — Telehealth: Payer: Self-pay

## 2018-02-17 NOTE — Telephone Encounter (Signed)
Called and advised Pt of carotid results

## 2018-02-17 NOTE — Telephone Encounter (Signed)
-----   Message from Pieter Partridge, DO sent at 02/13/2018  3:42 PM EST ----- Carotid doppler does not reveal any significant narrowing of her carotid arteries

## 2018-07-22 NOTE — Progress Notes (Signed)
Virtual Visit via Video Note The purpose of this virtual visit is to provide medical care while limiting exposure to the novel coronavirus.    Consent was obtained for video visit:  Yes.   Answered questions that patient had about telehealth interaction:  Yes.   I discussed the limitations, risks, security and privacy concerns of performing an evaluation and management service by telemedicine. I also discussed with the patient that there may be a patient responsible charge related to this service. The patient expressed understanding and agreed to proceed.  Pt location: Home Physician Location: office Name of referring provider:  Seward Carol, MD I connected with Ann Weiss at patients initiation/request on 07/23/2018 at  1:30 PM EDT by video enabled telemedicine application and verified that I am speaking with the correct person using two identifiers. Pt MRN:  [id number] Pt DOB:  1950-06-19 Video Participants:  Ann Weiss   History of Present Illness:  Ann Weiss is a 68 year old left-handed woman with hypertension, hyperlipidemia, diabetes, and goiter who follows up for stroke.  UPDATE: Current medications:  ASA 81mg  daily; atorvastatin 40mg ,   Carotid doppler from 02/13/18 showed less than 50% stenosis in both internal carotid arteries.  No pain associated with lumbar stenosis anymore.  So, she deferred surgery.  She uses a cane.  She had a couple of falls over past 2 weeks.  She has a hard time walking up an incline.  While walking up an incline, she lost her balance and fell.  Did not hit her head.  This past Monday, she was reaching over for something and fell forward on her face.    HISTORY: She was admitted to [facility] from 11/12/2017 to 11/13/2017 for stroke.  She had a 2-week history of dragging the right foot.  On arrival, blood pressure was 169/83.  MRI of the brain was personally reviewed and demonstrated a subacute infarct in the left ACA  territory.  MRA of the brain revealed no significant large vessel stenosis, occlusion, or aneurysm.  Echocardiogram demonstrated LVEF 55% - 65% but bubble study appeared not be performed.  Hgb A1c was 8.3 and LDL was 85.  At discharge, her aspirin was increased from 81 mg to 325 mg daily and Lipitor increased from 20 mg to 40 mg daily.  Following discharge, she developed right leg pain and stiffness.  An MRI of the lumbar spine from 11/23/2017 was personally reviewed and demonstrated severe L4-L5 facet arthrosis with severe spinal stenosis and severe right neural foraminal stenosis.  When rolling over in bed, she experienced a popping or tearing sensation in her right groin or upper thigh.  She presented to the ED at [facility] on 12/15/2017 for further evaluation.  X-ray of right femur and pelvis was unremarkable.  She was diagnosed with muscle strain and discharged on Robaxin.  She has been seen by neurosurgery who has chosen conservative management first before considering surgery.    Past Medical History: Past Medical History:  Diagnosis Date  . Constipation   . Diabetes mellitus   . GERD (gastroesophageal reflux disease)   . Goiter   . Hyperlipidemia   . Hypertension   . Hypothyroidism   . Premature menopause age 60  . Vitamin D deficiency 2009    Medications: Outpatient Encounter Medications as of 07/23/2018  Medication Sig  . Alogliptin Benzoate 25 MG TABS Take 1 tablet by mouth daily.  Marland Kitchen amLODipine (NORVASC) 10 MG tablet Take 1 tablet (10 mg total) by mouth  daily.  . aspirin EC 325 MG EC tablet Take 1 tablet (325 mg total) by mouth daily.  Marland Kitchen atorvastatin (LIPITOR) 40 MG tablet Take 1 tablet (40 mg total) by mouth daily at 6 PM.  . BAYER CONTOUR TEST test strip TEST BLOOD SUGARS 3 TIMES DAILY  . cholecalciferol (VITAMIN D) 1000 UNITS tablet Take 2,000 Units by mouth daily.  Marland Kitchen ibuprofen (ADVIL,MOTRIN) 600 MG tablet Take 600 mg by mouth every 8 (eight) hours as needed for pain.   Marland Kitchen levothyroxine (SYNTHROID, LEVOTHROID) 75 MCG tablet Take 75 mcg by mouth daily.  . metFORMIN (GLUCOPHAGE) 1000 MG tablet take 1 tablet by mouth twice a day with food (Patient taking differently: Take 1,000 mg by mouth daily. )  . methocarbamol (ROBAXIN) 500 MG tablet Take 1 tablet (500 mg total) by mouth every 8 (eight) hours as needed for muscle spasms.  . Multiple Vitamins-Minerals (CENTRUM SILVER PO) Take 1 tablet by mouth daily.    Marland Kitchen omeprazole (PRILOSEC) 20 MG capsule take 1 capsule by mouth once daily (Patient taking differently: Take 20 mg by mouth daily. )  . pregabalin (LYRICA) 150 MG capsule Take 150 mg by mouth 2 (two) times daily.  Marland Kitchen telmisartan-hydrochlorothiazide (MICARDIS HCT) 80-25 MG tablet Take 1 tablet by mouth daily.   No facility-administered encounter medications on file as of 07/23/2018.     Allergies: No Known Allergies  Family History: Family History  Problem Relation Age of Onset  . Hypertension Mother   . Kidney disease Mother        renal failure, dialysis  . Hypertension Father   . Stroke Father   . Arthritis Maternal Aunt   . Heart disease Maternal Aunt   . Diabetes Maternal Grandmother   . Cancer Neg Hx   . Breast cancer Neg Hx   . Colon cancer Neg Hx     Social History: Social History   Socioeconomic History  . Marital status: Married    Spouse name: Sonia Side  . Number of children: 2  . Years of education: Not on file  . Highest education level: Bachelor's degree (e.g., BA, AB, BS)  Occupational History  . Occupation: Retired from [employer]: RETIRED  Social Needs  . Financial resource strain: Not on file  . Food insecurity    Worry: Not on file    Inability: Not on file  . Transportation needs    Medical: Not on file    Non-medical: Not on file  Tobacco Use  . Smoking status: Never Smoker  . Smokeless tobacco: Never Used  Substance and Sexual Activity  . Alcohol use: Not Currently    Comment: socially maybe 1-2 times monthly   . Drug use: No  . Sexual activity: Not Currently  Lifestyle  . Physical activity    Days per week: Not on file    Minutes per session: Not on file  . Stress: Not on file  Relationships  . Social Herbalist on phone: Not on file    Gets together: Not on file    Attends religious service: Not on file    Active member of club or organization: Not on file    Attends meetings of clubs or organizations: Not on file    Relationship status: Not on file  . Intimate partner violence    Fear of current or ex partner: Not on file    Emotionally abused: Not on file    Physically abused: Not on file  Forced sexual activity: Not on file  Other Topics Concern  . Not on file  Social History Narrative   Lives alone.  Daughter lives in [place] and other daughter lives in [place], [place]. 4 grandchildren      Patient is right-handed. She lives with her husband in a two level home. She drinks 2 cups of coffee a day, she does not exercise..    Observations/Objective:   Height 5\' 6"  (1.676 m), weight 220 lb (99.8 kg). No acute distress.  Alert and oriented.  Speech fluent and not dysarthric.  Language intact.  Eyes orthophoric on primary gaze.  Face symmetric.  Assessment and Plan:   1.  Lumbar stenosis causing right L5 radiculopathy 2.  Left ACA territory infarct, unclear etiology 3.  HLD 4.  HTN 5.  Type 2 diabetes mellitus  1.  Continue ASA 81mg  for secondary stroke prevention. 2.  Atorvastatin 40mg  daily (LDL goal less than 70) 3.  Blood pressure and glycemic control 4.  Advised that she contact her previous physical therapist for at-home exercises 5.  Advised to use cane at all times 6.  Follow up in 6 months.  Follow Up Instructions:    -I discussed the assessment and treatment plan with the patient. The patient was provided an opportunity to ask questions and all were answered. The patient agreed with the plan and demonstrated an understanding of the instructions.   The patient  was advised to call back or seek an in-person evaluation if the symptoms worsen or if the condition fails to improve as anticipated.    Total Time spent in visit with the patient was:  21 minutes Dudley Major, DO

## 2018-07-23 ENCOUNTER — Other Ambulatory Visit: Payer: Self-pay

## 2018-07-23 ENCOUNTER — Telehealth (INDEPENDENT_AMBULATORY_CARE_PROVIDER_SITE_OTHER): Payer: No Typology Code available for payment source | Admitting: Neurology

## 2018-07-23 ENCOUNTER — Encounter: Payer: Self-pay | Admitting: Neurology

## 2018-07-23 VITALS — Ht 66.0 in | Wt 220.0 lb

## 2018-07-23 DIAGNOSIS — E119 Type 2 diabetes mellitus without complications: Secondary | ICD-10-CM

## 2018-07-23 DIAGNOSIS — M5417 Radiculopathy, lumbosacral region: Secondary | ICD-10-CM

## 2018-07-23 DIAGNOSIS — E785 Hyperlipidemia, unspecified: Secondary | ICD-10-CM

## 2018-07-23 DIAGNOSIS — I10 Essential (primary) hypertension: Secondary | ICD-10-CM

## 2018-07-23 DIAGNOSIS — I63522 Cerebral infarction due to unspecified occlusion or stenosis of left anterior cerebral artery: Secondary | ICD-10-CM | POA: Diagnosis not present

## 2018-07-29 ENCOUNTER — Encounter: Payer: Self-pay | Admitting: Emergency Medicine

## 2018-07-29 ENCOUNTER — Other Ambulatory Visit: Payer: Self-pay

## 2018-07-29 ENCOUNTER — Emergency Department
Admission: EM | Admit: 2018-07-29 | Discharge: 2018-07-30 | Disposition: A | Discharge status: Home / Self Care | Payer: No Typology Code available for payment source | Attending: Emergency Medicine | Admitting: Emergency Medicine

## 2018-07-29 DIAGNOSIS — R296 Repeated falls: Secondary | ICD-10-CM | POA: Insufficient documentation

## 2018-07-29 DIAGNOSIS — R531 Weakness: Secondary | ICD-10-CM | POA: Diagnosis not present

## 2018-07-29 DIAGNOSIS — E039 Hypothyroidism, unspecified: Secondary | ICD-10-CM | POA: Diagnosis not present

## 2018-07-29 DIAGNOSIS — Z79899 Other long term (current) drug therapy: Secondary | ICD-10-CM | POA: Diagnosis not present

## 2018-07-29 DIAGNOSIS — I10 Essential (primary) hypertension: Secondary | ICD-10-CM | POA: Diagnosis not present

## 2018-07-29 DIAGNOSIS — E119 Type 2 diabetes mellitus without complications: Secondary | ICD-10-CM | POA: Insufficient documentation

## 2018-07-29 DIAGNOSIS — R269 Unspecified abnormalities of gait and mobility: Secondary | ICD-10-CM | POA: Diagnosis present

## 2018-07-29 LAB — CBC
HCT: 42.8 % (ref 36.0–46.0)
Hemoglobin: 14.1 g/dL (ref 12.0–15.0)
MCH: 29.9 pg (ref 26.0–34.0)
MCHC: 32.9 g/dL (ref 30.0–36.0)
MCV: 90.9 fL (ref 80.0–100.0)
Platelets: 232 10*3/uL (ref 150–400)
RBC: 4.71 MIL/uL (ref 3.87–5.11)
RDW: 13 % (ref 11.5–15.5)
WBC: 9.8 10*3/uL (ref 4.0–10.5)
nRBC: 0 % (ref 0.0–0.2)

## 2018-07-29 LAB — URINALYSIS, COMPLETE (UACMP) WITH MICROSCOPIC
Bacteria, UA: NONE SEEN
Bilirubin Urine: NEGATIVE
Glucose, UA: >=500 mg/dL — AB
Hgb urine dipstick: NEGATIVE
Ketones, ur: 5 mg/dL — AB
Leukocytes,Ua: NEGATIVE
Nitrite: NEGATIVE
Protein, ur: NEGATIVE mg/dL
Specific Gravity, Urine: 1.011 (ref 1.005–1.030)
WBC, UA: NONE SEEN WBC/hpf (ref 0–5)
pH: 6 (ref 5.0–8.0)

## 2018-07-29 LAB — BASIC METABOLIC PANEL
Anion gap: 9 (ref 5–15)
BUN: 18 mg/dL (ref 8–23)
CO2: 24 mmol/L (ref 22–32)
Calcium: 9.3 mg/dL (ref 8.9–10.3)
Chloride: 107 mmol/L (ref 98–111)
Creatinine, Ser: 0.94 mg/dL (ref 0.44–1.00)
GFR calc Af Amer: >60 mL/min (ref >60)
GFR calc non Af Amer: >60 mL/min (ref >60)
Glucose, Bld: 184 mg/dL — ABNORMAL HIGH (ref 70–99)
Potassium: 3.9 mmol/L (ref 3.5–5.1)
Sodium: 140 mmol/L (ref 135–145)

## 2018-07-29 NOTE — ED Provider Notes (Signed)
[facility] Emergency Department Provider Note  ____________________________________________   First MD Initiated Contact with Patient 07/29/18 2304     (approximate)  I have reviewed the triage vital signs and the nursing notes.   HISTORY  Chief Complaint Fall   HPI Ann Weiss is a 68 y.o. female with below list of previous medical conditions including CVA with altered right lower extremity weakness presents to the emergency department with history of multiple falls which patient states have been occurring since last October secondary to loss of balance.  Patient states that she seen her neurologist for this and advised to follow-up with physical therapy.  Patient states that she is also seen her neurosurgery advised her that she has tender changes in her spine for which she is received 2 epidural injections however she did not obtain her third secondary to COVID-19.  Patient presents tonight secondary to 3 falls this month.  Patient denies any head injury with any of her falls.  Patient denies any pain in general.  Patient denies any headache no new weakness numbness or visual change       Past Medical History:  Diagnosis Date  . Constipation   . Diabetes mellitus   . GERD (gastroesophageal reflux disease)   . Goiter   . Hyperlipidemia   . Hypertension   . Hypothyroidism   . Premature menopause age 63  . Vitamin D deficiency 2009    Patient Active Problem List   Diagnosis Date Noted  . Acute ischemic left ACA stroke ([place]) 11/12/2017  . GERD (gastroesophageal reflux disease) 11/23/2012  . Major depression in remission ([place]) 11/23/2012  . Vitamin D deficiency 04/24/2011  . Essential hypertension, benign 05/30/2010  . Pure hypercholesterolemia 05/30/2010  . Controlled type 2 diabetes mellitus without complication ([place]) 10/23/5100  . Hypothyroidism 05/30/2010    Past Surgical History:  Procedure Laterality Date  . CESAREAN SECTION     x 2   . COLONOSCOPY  [id number]  . ESOPHAGOGASTRODUODENOSCOPY  [id number]  . FOOT SURGERY Right 01/2013   bone spur  . TUBAL LIGATION      Prior to Admission medications   Medication Sig Start Date End Date Taking? Authorizing Provider  Alogliptin Benzoate 25 MG TABS Take 1 tablet by mouth daily. 07/20/14   Rita Ohara, MD  amLODipine (NORVASC) 10 MG tablet Take 1 tablet (10 mg total) by mouth daily. 06/27/14   Rita Ohara, MD  aspirin EC 81 MG tablet Take 81 mg by mouth daily.    [provider]  atorvastatin (LIPITOR) 40 MG tablet Take 1 tablet (40 mg total) by mouth daily at 6 PM. 11/13/17   Demetrios Loll, MD  BAYER CONTOUR TEST test strip TEST BLOOD SUGARS 3 TIMES DAILY 12/27/13   Rita Ohara, MD  cholecalciferol (VITAMIN D) 1000 UNITS tablet Take 2,000 Units by mouth daily.    [provider]  cyclobenzaprine (FLEXERIL) 10 MG tablet Take 1 tablet (10 mg total) by mouth 3 (three) times daily as needed. 07/30/18   Gregor Hams, MD  ibuprofen (ADVIL,MOTRIN) 600 MG tablet Take 600 mg by mouth every 8 (eight) hours as needed for pain. 11/07/17   [provider]  levothyroxine (SYNTHROID, LEVOTHROID) 75 MCG tablet Take 75 mcg by mouth daily. 10/09/17   [provider]  metFORMIN (GLUCOPHAGE) 1000 MG tablet take 1 tablet by mouth twice a day with food Patient taking differently: Take 1,000 mg by mouth daily.  06/27/14   Rita Ohara, MD  methocarbamol (ROBAXIN) 500 MG tablet Take 1 tablet (500 mg total) by mouth every 8 (eight) hours as needed for muscle spasms. 12/16/17   Maudie Flakes, MD  Multiple Vitamins-Minerals (CENTRUM SILVER PO) Take 1 tablet by mouth daily.      [provider]  omeprazole (PRILOSEC) 20 MG capsule take 1 capsule by mouth once daily Patient taking differently: Take 20 mg by mouth daily.  11/29/13   Rita Ohara, MD  telmisartan-hydrochlorothiazide (MICARDIS HCT) 80-25 MG tablet Take 1 tablet by mouth daily. 10/24/17   [provider]     Allergies Patient has no known allergies.  Family History  Problem Relation Age of Onset  . Hypertension Mother   . Kidney disease Mother        renal failure, dialysis  . Hypertension Father   . Stroke Father   . Arthritis Maternal Aunt   . Heart disease Maternal Aunt   . Diabetes Maternal Grandmother   . Cancer Neg Hx   . Breast cancer Neg Hx   . Colon cancer Neg Hx     Social History Social History   Tobacco Use  . Smoking status: Never Smoker  . Smokeless tobacco: Never Used  Substance Use Topics  . Alcohol use: Not Currently    Comment: socially maybe 1-2 times monthly  . Drug use: No    Review of Systems Constitutional: No fever/chills Eyes: No visual changes. ENT: No sore throat. Cardiovascular: Denies chest pain. Respiratory: Denies shortness of breath. Gastrointestinal: No abdominal pain.  No nausea, no vomiting.  No diarrhea.  No constipation. Genitourinary: Negative for dysuria. Musculoskeletal: Negative for neck pain.  Negative for back pain. Integumentary: Negative for rash. Neurological: Negative for headaches, focal weakness or numbness.  ____________________________________________   PHYSICAL EXAM:  VITAL SIGNS: ED Triage Vitals  Enc Vitals Group     BP 07/29/18 1710 (!) 179/90     Pulse Rate 07/29/18 1710 91     Resp 07/29/18 1710 18     Temp 07/29/18 1710 99.5 F (37.5 C)     Temp src --      SpO2 07/29/18 1710 99 %     Weight 07/29/18 1714 99.8 kg (220 lb)     Height 07/29/18 1714 1.676 m (5\' 6" )     Head Circumference --      Peak Flow --      Pain Score 07/29/18 1714 0     Pain Loc --      Pain Edu? --      Excl. in [place]? --     Constitutional: Alert and oriented. Well appearing and in no acute distress. Eyes: Conjunctivae are normal. Head: Atraumatic. Mouth/Throat: Mucous membranes are moist.  Oropharynx non-erythematous. Neck: No stridor.   Cardiovascular: Normal rate, regular rhythm. Good peripheral circulation.  Grossly normal heart sounds. Respiratory: Normal respiratory effort.  No retractions. No audible wheezing. Gastrointestinal: Soft and nontender. No distention.  Musculoskeletal: No lower extremity tenderness nor edema. No gross deformities of extremities. Neurologic:  Normal speech and language. No gross focal neurologic deficits are appreciated.  Skin:  Skin is warm, dry and intact. No rash noted. Psychiatric: Mood and affect are normal. Speech and behavior are normal.  ____________________________________________   LABS (all labs ordered are listed, but only abnormal results are displayed)  Labs Reviewed  BASIC METABOLIC PANEL - Abnormal; Notable for the following components:      Result Value   Glucose, Bld 184 (*)    All other  components within normal limits  URINALYSIS, COMPLETE (UACMP) WITH MICROSCOPIC - Abnormal; Notable for the following components:   Color, Urine STRAW (*)    APPearance CLEAR (*)    Glucose, UA >=500 (*)    Ketones, ur 5 (*)    All other components within normal limits  CBC   ____________________________________________  EKG  ED ECG REPORT I, [place] N Shabre Kreher, the attending physician, personally viewed and interpreted this ECG.   Date: 07/29/2018  EKG Time: 5:23 PM  Rate: 87  Rhythm: Normal sinus rhythm  Axis: Normal  Intervals: Normal  ST&T Change: None  Procedures   ____________________________________________   INITIAL IMPRESSION / MDM / ASSESSMENT AND PLAN / ED COURSE  As part of my medical decision making, I reviewed the following data within the electronic MEDICAL RECORD NUMBER   69 year old female presented with above-stated history and physical exam secondary to fall due to loss of balance.  Patient without any complaints other than continued bilateral leg stiffness which patient states is been system since her stroke.  Patient is requesting something to help with the stiffness in her legs.  Patient will be prescribed a muscle relaxant  (Flexeril).  I advised the patient to follow-up with physical therapy as she was instructed to do by neurology.  Given the absence of any acute neurological abnormality imaging not performed. ____________________________________________  FINAL CLINICAL IMPRESSION(S) / ED DIAGNOSES  Final diagnoses:  Gait disturbance     MEDICATIONS GIVEN DURING THIS VISIT:  Medications - No data to display   ED Discharge Orders         Ordered    cyclobenzaprine (FLEXERIL) 10 MG tablet  3 times daily PRN,   Status:  Discontinued     07/30/18 0041    cyclobenzaprine (FLEXERIL) 10 MG tablet  3 times daily PRN     07/30/18 0042          *Please note:  Ann Weiss was evaluated in Emergency Department on 07/30/2018 for the symptoms described in the history of present illness. She was evaluated in the context of the global COVID-19 pandemic, which necessitated consideration that the patient might be at risk for infection with the SARS-CoV-2 virus that causes COVID-19. Institutional protocols and algorithms that pertain to the evaluation of patients at risk for COVID-19 are in a state of rapid change based on information released by regulatory bodies including the CDC and federal and state organizations. These policies and algorithms were followed during the patient's care in the ED.  Some ED evaluations and interventions may be delayed as a result of limited staffing during the pandemic.*  Note:  This document was prepared using Dragon voice recognition software and may include unintentional dictation errors.   Gregor Hams, MD 07/30/18 [phone]

## 2018-07-29 NOTE — ED Notes (Signed)
Pt up to bathroom with walker.

## 2018-07-29 NOTE — ED Triage Notes (Signed)
Pt lost balance and fell today.  Reports multiple falls since stroke last year r/t balance problems.  Abrasion to right elbow.    Has seen her neurologist and per pt he told her she needs her legs stretched.  "I can't seem to get rid of this stiffness in my legs but I need to be seen because I keep falling".  Pt also reports [facility] told her she has arthritis in spine and pinched nerve and will need surgery and to call when she is ready.  Would like to be seen for the weakness in her legs; present for 5-6 months.

## 2018-07-30 ENCOUNTER — Telehealth: Payer: Self-pay | Admitting: Neurology

## 2018-07-30 MED ORDER — CYCLOBENZAPRINE HCL 10 MG PO TABS: 10.0000 mg | ORAL_TABLET | Freq: Three times a day (TID) | ORAL | 0 refills | Status: DC | PRN

## 2018-07-30 NOTE — Telephone Encounter (Signed)
Patient is calling in that she has had a really bad fall. The last fall was yesterday and she ended up at emergency room. She brusied and banged up.

## 2018-07-31 NOTE — Telephone Encounter (Signed)
This is a known diagnosis and already had MRI less than a year ago.  As I recommended, PT is appropriate management.

## 2018-07-31 NOTE — Telephone Encounter (Signed)
Called and spoke with Pt.   I asked her if she was using the cane as advised, she stated she was using it when she fell. She said her legs just will not move like she wants them to. She requested an MRI while in the ED, was told would be too expensive and should request from neurologist. We discussed it sounded like the known lumbar stenosis, and radiculopathy.  I asked if she had contacted the physical therapist about starting therapy again, she said he was on vacation but will be back next week.  I asked her if she has a walker, she states that is what she is now using.

## 2018-07-31 NOTE — Telephone Encounter (Signed)
Patient is calling in again

## 2018-07-31 NOTE — Telephone Encounter (Signed)
Called and LMOVM advising Pt to return call anytime after 8am Monday

## 2018-08-04 ENCOUNTER — Ambulatory Visit: Payer: No Typology Code available for payment source | Admitting: Neurology

## 2018-09-24 ENCOUNTER — Other Ambulatory Visit: Payer: Self-pay | Admitting: Internal Medicine

## 2018-09-24 ENCOUNTER — Ambulatory Visit
Admission: RE | Admit: 2018-09-24 | Discharge: 2018-09-24 | Disposition: A | Discharge status: Home / Self Care | Payer: No Typology Code available for payment source | Source: Ambulatory Visit | Attending: Internal Medicine | Admitting: Internal Medicine

## 2018-09-24 DIAGNOSIS — K5900 Constipation, unspecified: Secondary | ICD-10-CM

## 2018-09-24 IMAGING — CR DG ABDOMEN 2V
2 series · 2 of 2 positions shown · non-contrast
Comparison: 02/14/2010

CLINICAL DATA: Constipation for 2 weeks

EXAM:
ABDOMEN - 2 VIEW

[w abdomen upright *]
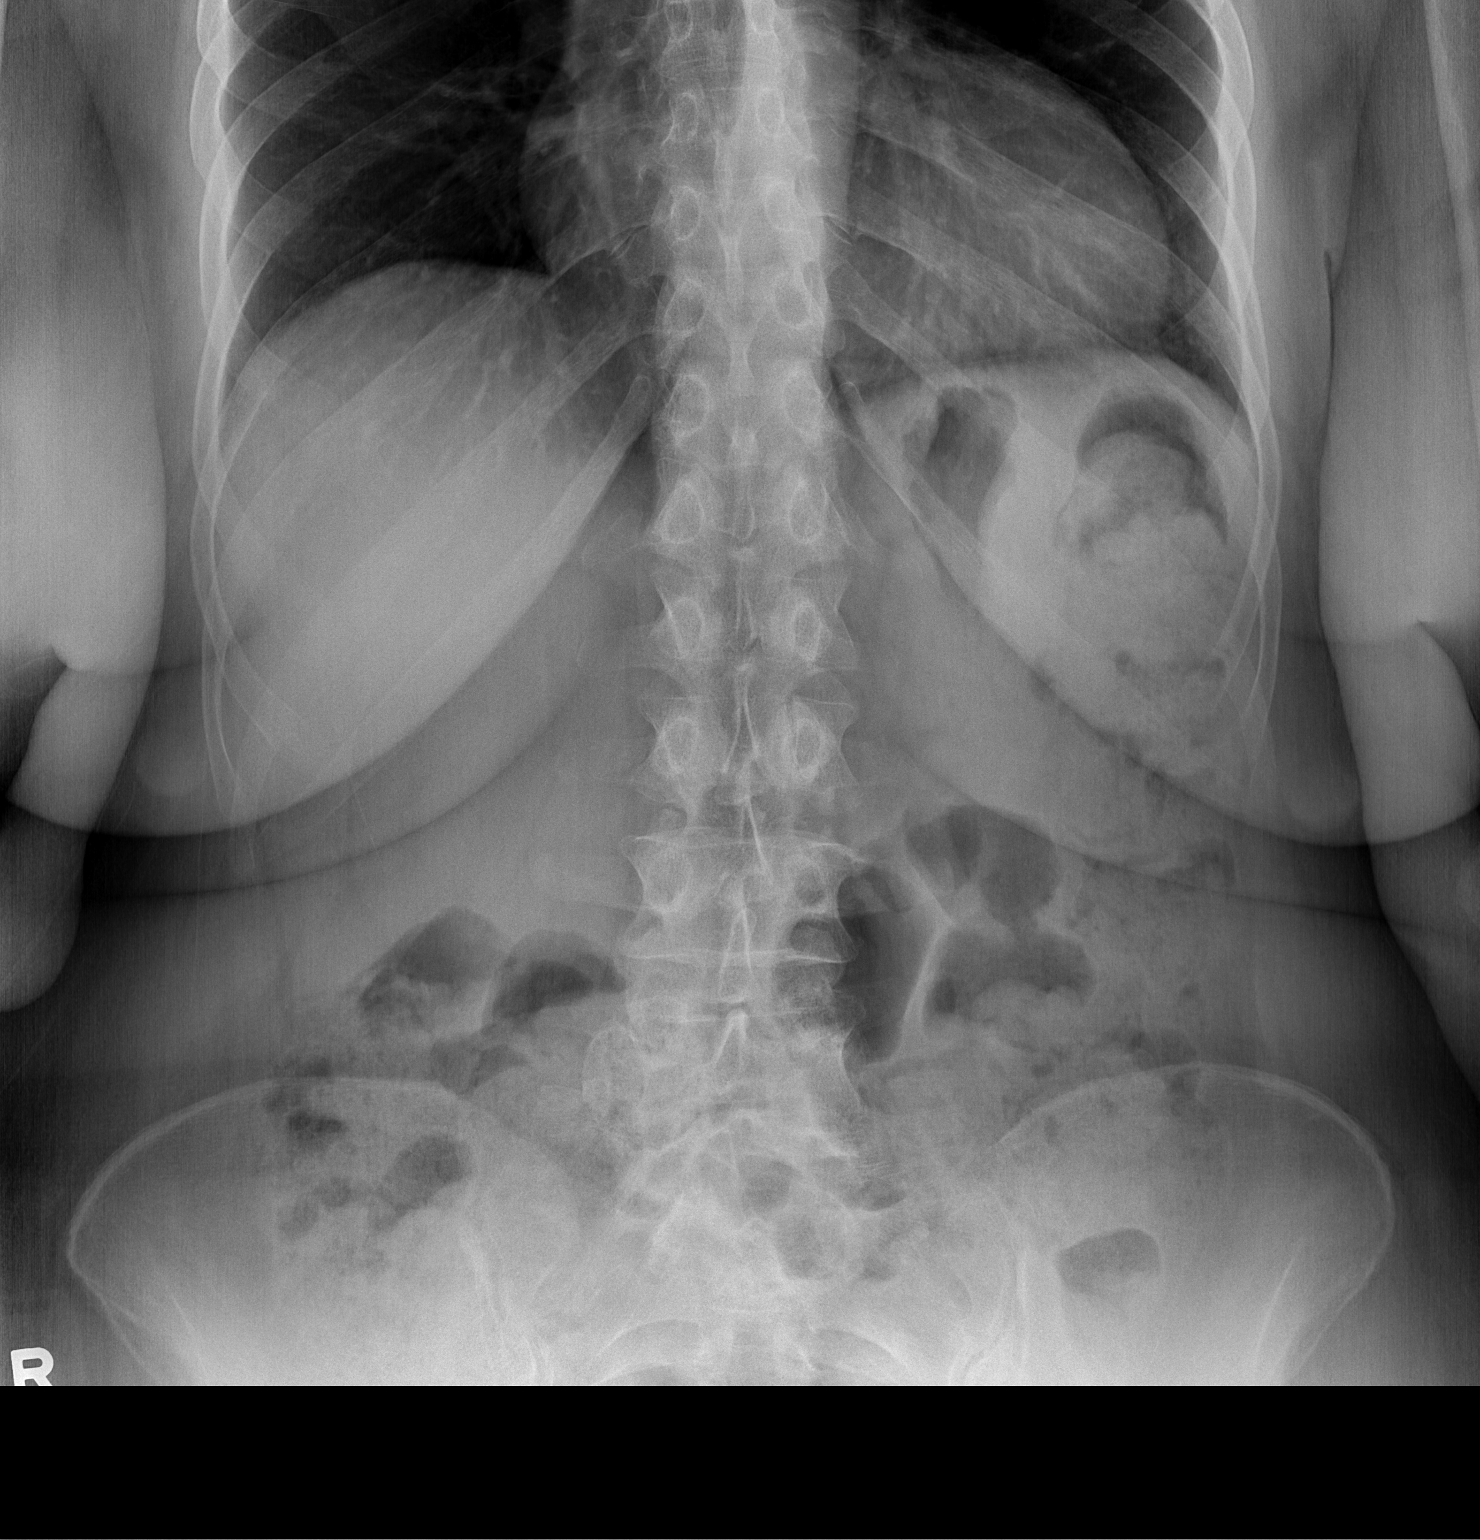

[t abdomen supine]
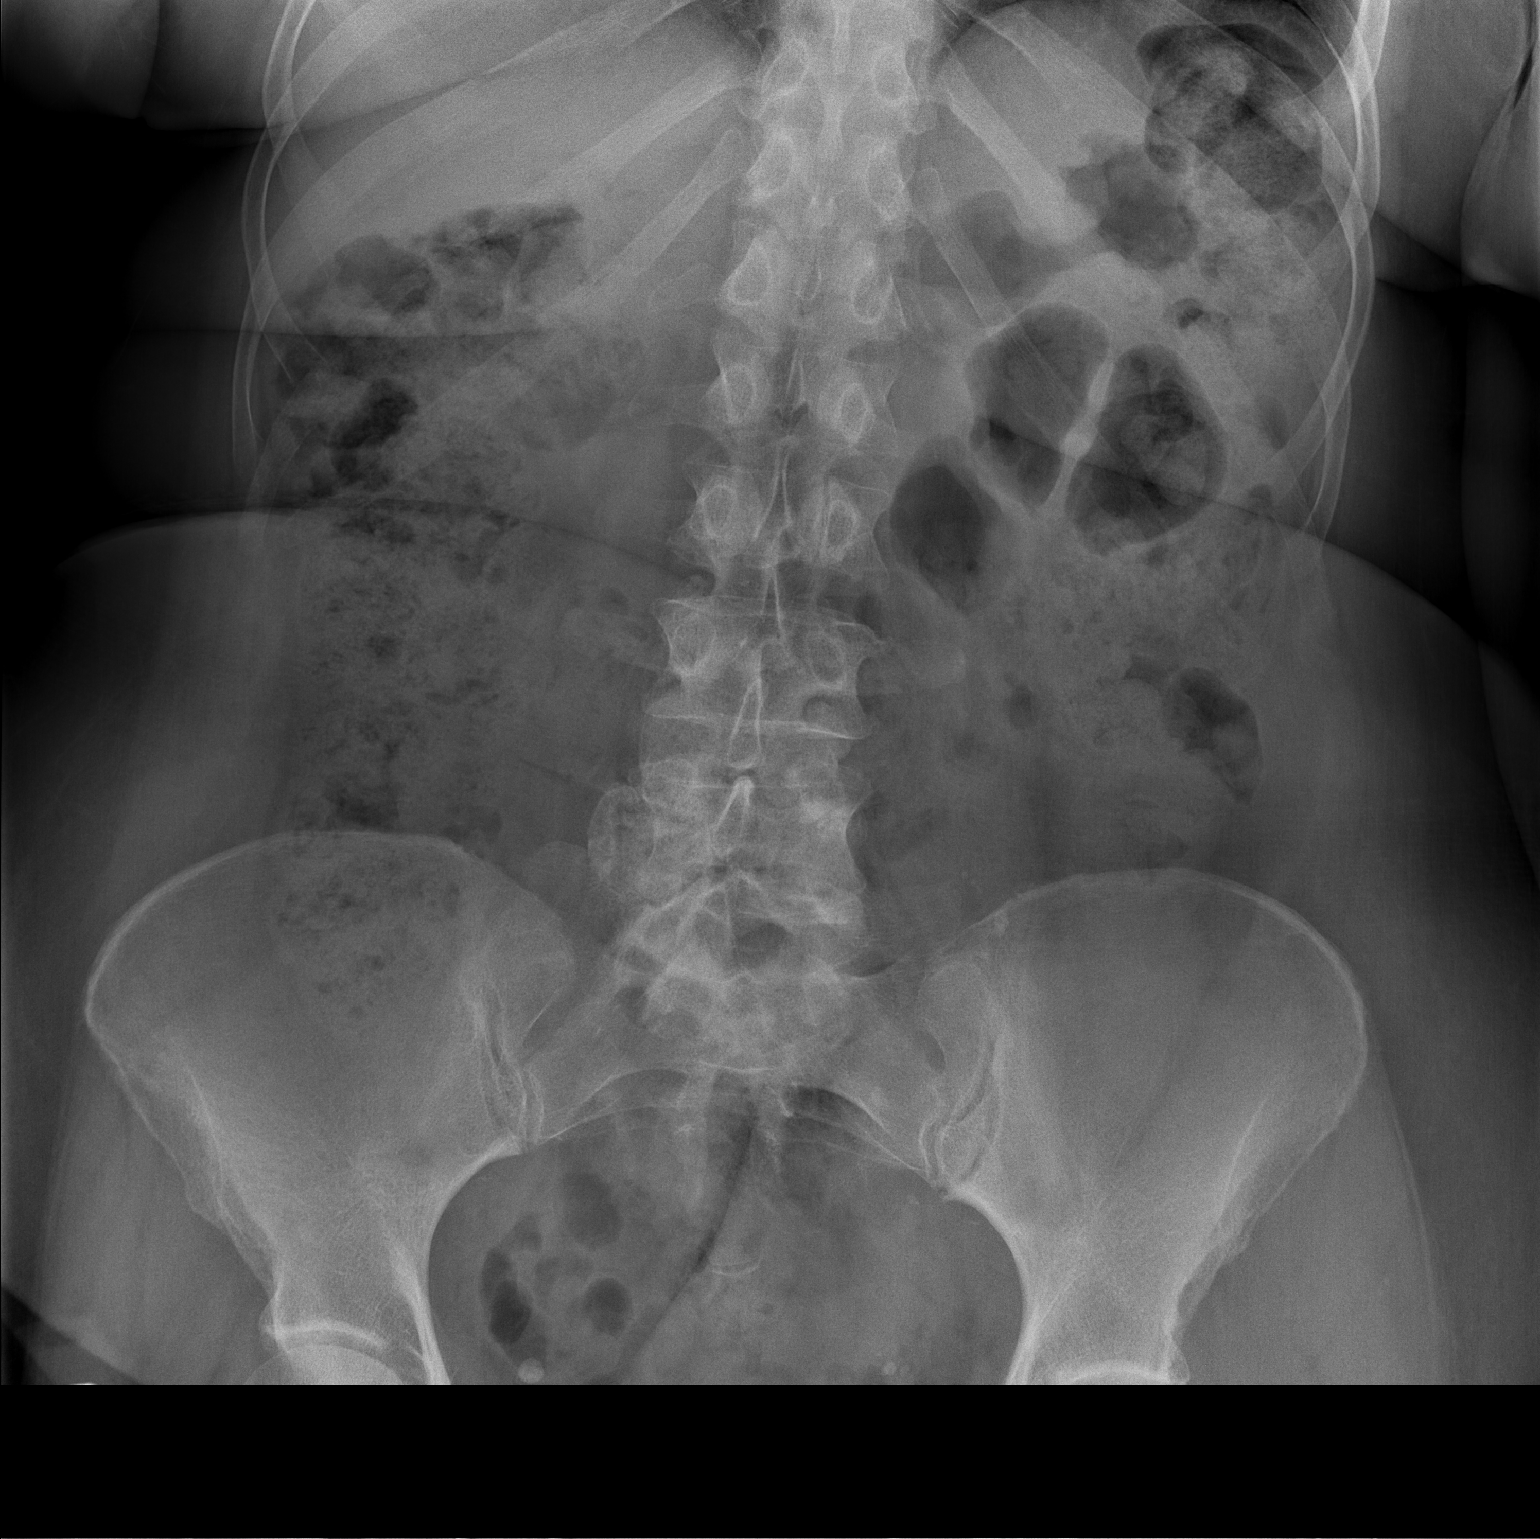

[2 of 2 positions shown; findings below may reference images not displayed]

FINDINGS: There is a large amount of stool throughout the colon. There is no
bowel dilatation to suggest obstruction. There is no evidence of
pneumoperitoneum, portal venous gas or pneumatosis.

There are no pathologic calcifications along the expected course of
the ureters.

The osseous structures are unremarkable.
IMPRESSION: Large amount of stool throughout the colon.

## 2018-11-23 NOTE — Progress Notes (Signed)
Virtual Visit via Telephone Note The purpose of this virtual visit is to provide medical care while limiting exposure to the novel coronavirus.    Consent was obtained for phone visit:  Yes.   Answered questions that patient had about telehealth interaction:  Yes.   I discussed the limitations, risks, security and privacy concerns of performing an evaluation and management service by telephone. I also discussed with the patient that there may be a patient responsible charge related to this service. The patient expressed understanding and agreed to proceed.  Pt location: Home Physician Location: office Name of referring provider:  Seward Carol, MD I connected with .Barnabas Harries at patients initiation/request on 11/24/2018 at  1:30 PM EST by telephone and verified that I am speaking with the correct person using two identifiers.  Pt MRN:  [id number] Pt DOB:  12-Mar-1950  History of Present Illness:  Ann Weiss is a 68 year oldleft-handed woman with hypertension, hyperlipidemia, diabetes, and goiter who follows up for stroke.  UPDATE: Current medications:  ASA 81mg  daily; atorvastatin 40mg ; Flexeril 10mg ; Norvasc; metformin; Micardis HCT   She uses a cane.  She had a couple of falls, one time on 07/29/2018, she presented to the ED.  She reported that her balance issues were mostly related to leg stiffness and she was prescribed Flexeril. She tripped once but not fall since then, but otherwise doing well.  She has been participating in physical therapy.  Ambulates with cane (or walker if on uneven ground).  She reports difficulty walking at a decline.    HISTORY: She was admitted to [facility] from 11/12/2017 to 11/13/2017 for stroke. She had a 2-week history of dragging the right foot. On arrival, blood pressure was 169/83. MRI of the brain was personally reviewed and demonstrated a subacute infarct in the left ACA territory. MRA of the brain revealed no  significant large vessel stenosis, occlusion, or aneurysm. Echocardiogram demonstrated LVEF 55% - 65% but bubble study appeared not be performed.Hgb A1c was 8.3 and LDL was 85.At discharge, her aspirin was increased from 81 mg to 325 mg daily and Lipitor increased from 20 mg to 40 mg daily. Carotid doppler from 02/13/18 showed less than 50% stenosis in both internal carotid arteries.  Following discharge, she developed right leg pain and stiffness. An MRI of the lumbar spine from 11/23/2017 was personally reviewed and demonstrated severe L4-L5 facet arthrosis with severe spinal stenosis and severe right neural foraminal stenosis. When rolling over in bed, she experienced a popping or tearing sensation in her right groin or upper thigh. She presented to the ED at [facility] on 12/15/2017 for further evaluation. X-ray of right femur and pelvis was unremarkable. She was diagnosed with muscle strain and discharged on Robaxin. She has been seen by neurosurgery who has chosen conservative management first before considering surgery.  She reported that pain had resolved and deferred surgery.  Past Medical History: Past Medical History:  Diagnosis Date  . Constipation   . Diabetes mellitus   . GERD (gastroesophageal reflux disease)   . Goiter   . Hyperlipidemia   . Hypertension   . Hypothyroidism   . Premature menopause age 68  . Vitamin D deficiency 2009    Medications: Outpatient Encounter Medications as of 11/24/2018  Medication Sig  . Alogliptin Benzoate 25 MG TABS Take 1 tablet by mouth daily.  Marland Kitchen amLODipine (NORVASC) 10 MG tablet Take 1 tablet (10 mg total) by mouth daily.  Marland Kitchen aspirin EC 81  MG tablet Take 81 mg by mouth daily.  Marland Kitchen atorvastatin (LIPITOR) 40 MG tablet Take 1 tablet (40 mg total) by mouth daily at 6 PM.  . BAYER CONTOUR TEST test strip TEST BLOOD SUGARS 3 TIMES DAILY  . cholecalciferol (VITAMIN D) 1000 UNITS tablet Take 2,000 Units by mouth daily.  .  cyclobenzaprine (FLEXERIL) 10 MG tablet Take 1 tablet (10 mg total) by mouth 3 (three) times daily as needed.  Marland Kitchen ibuprofen (ADVIL,MOTRIN) 600 MG tablet Take 600 mg by mouth every 8 (eight) hours as needed for pain.  Marland Kitchen levothyroxine (SYNTHROID, LEVOTHROID) 75 MCG tablet Take 75 mcg by mouth daily.  . metFORMIN (GLUCOPHAGE) 1000 MG tablet take 1 tablet by mouth twice a day with food (Patient taking differently: Take 1,000 mg by mouth daily. )  . methocarbamol (ROBAXIN) 500 MG tablet Take 1 tablet (500 mg total) by mouth every 8 (eight) hours as needed for muscle spasms.  . Multiple Vitamins-Minerals (CENTRUM SILVER PO) Take 1 tablet by mouth daily.    Marland Kitchen omeprazole (PRILOSEC) 20 MG capsule take 1 capsule by mouth once daily (Patient taking differently: Take 20 mg by mouth daily. )  . telmisartan-hydrochlorothiazide (MICARDIS HCT) 80-25 MG tablet Take 1 tablet by mouth daily.   No facility-administered encounter medications on file as of 11/24/2018.     Allergies: No Known Allergies  Family History: Family History  Problem Relation Age of Onset  . Hypertension Mother   . Kidney disease Mother        renal failure, dialysis  . Hypertension Father   . Stroke Father   . Arthritis Maternal Aunt   . Heart disease Maternal Aunt   . Diabetes Maternal Grandmother   . Cancer Neg Hx   . Breast cancer Neg Hx   . Colon cancer Neg Hx     Social History: Social History   Socioeconomic History  . Marital status: Married    Spouse name: Sonia Side  . Number of children: 2  . Years of education: Not on file  . Highest education level: Bachelor's degree (e.g., BA, AB, BS)  Occupational History  . Occupation: Retired from [employer]: RETIRED  Social Needs  . Financial resource strain: Not on file  . Food insecurity    Worry: Not on file    Inability: Not on file  . Transportation needs    Medical: Not on file    Non-medical: Not on file  Tobacco Use  . Smoking status: Never Smoker  .  Smokeless tobacco: Never Used  Substance and Sexual Activity  . Alcohol use: Not Currently    Comment: socially maybe 1-2 times monthly  . Drug use: No  . Sexual activity: Not Currently  Lifestyle  . Physical activity    Days per week: Not on file    Minutes per session: Not on file  . Stress: Not on file  Relationships  . Social Herbalist on phone: Not on file    Gets together: Not on file    Attends religious service: Not on file    Active member of club or organization: Not on file    Attends meetings of clubs or organizations: Not on file    Relationship status: Not on file  . Intimate partner violence    Fear of current or ex partner: Not on file    Emotionally abused: Not on file    Physically abused: Not on file    Forced sexual  activity: Not on file  Other Topics Concern  . Not on file  Social History Narrative   Lives alone.  Daughter lives in [place] and other daughter lives in [place], [place]. 4 grandchildren      Patient is right-handed. She lives with her husband in a two level home. She drinks 2 cups of coffee a day, she does not exercise..    Observations/Objective:   There were no vitals taken for this visit. No acute distress.  Alert and oriented.  Speech fluent and not dysarthric.  Language intact.    Assessment and Plan:   1.  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  Lumbar stenosis causing right L5 radiculopathy 3.  Hypertension 4.  Hyperlipidemia 5.  Type 2 diabetes mellitus   1.  ASA 81mg  daily for secondary stroke prevention. 2.  Atorvastatin 40mg  daily (LDL goal less than 70) 3.  Blood pressure and glycemic control 4.   Refill Flexeril for low back and leg stiffness. 5.  Continue physical therapy; use cane or walker 6.  Follow up in 6 months  Follow Up Instructions:    -I discussed the assessment and treatment plan with the patient. The patient was provided an opportunity to ask questions and all were answered. The patient agreed  with the plan and demonstrated an understanding of the instructions.   The patient was advised to call back or seek an in-person evaluation if the symptoms worsen or if the condition fails to improve as anticipated.  Total Time spent in visit with the patient was:  12 minutes.  Dudley Major, DO

## 2018-11-24 ENCOUNTER — Telehealth (INDEPENDENT_AMBULATORY_CARE_PROVIDER_SITE_OTHER): Payer: No Typology Code available for payment source | Admitting: Neurology

## 2018-11-24 ENCOUNTER — Other Ambulatory Visit: Payer: Self-pay

## 2018-11-24 ENCOUNTER — Encounter: Payer: Self-pay | Admitting: Neurology

## 2018-11-24 DIAGNOSIS — M5417 Radiculopathy, lumbosacral region: Secondary | ICD-10-CM

## 2018-11-24 DIAGNOSIS — E785 Hyperlipidemia, unspecified: Secondary | ICD-10-CM

## 2018-11-24 DIAGNOSIS — I63522 Cerebral infarction due to unspecified occlusion or stenosis of left anterior cerebral artery: Secondary | ICD-10-CM | POA: Diagnosis not present

## 2018-11-24 DIAGNOSIS — I10 Essential (primary) hypertension: Secondary | ICD-10-CM

## 2018-11-24 DIAGNOSIS — E119 Type 2 diabetes mellitus without complications: Secondary | ICD-10-CM

## 2018-11-24 MED ORDER — CYCLOBENZAPRINE HCL 10 MG PO TABS: 10.0000 mg | ORAL_TABLET | Freq: Three times a day (TID) | ORAL | 5 refills | Status: DC | PRN

## 2018-11-24 NOTE — Addendum Note (Signed)
Addended byTomi Likens, ADAM R on: 11/24/2018 01:52 PM   Modules accepted: Orders

## 2019-02-21 ENCOUNTER — Ambulatory Visit: Payer: No Typology Code available for payment source

## 2019-04-01 ENCOUNTER — Emergency Department (HOSPITAL_COMMUNITY)
Admission: EM | Admit: 2019-04-01 | Discharge: 2019-04-01 | Disposition: A | Discharge status: Home / Self Care | Payer: No Typology Code available for payment source | Attending: Emergency Medicine | Admitting: Emergency Medicine

## 2019-04-01 ENCOUNTER — Encounter (HOSPITAL_COMMUNITY): Payer: Self-pay | Admitting: Emergency Medicine

## 2019-04-01 ENCOUNTER — Other Ambulatory Visit: Payer: Self-pay

## 2019-04-01 DIAGNOSIS — Z79899 Other long term (current) drug therapy: Secondary | ICD-10-CM | POA: Diagnosis not present

## 2019-04-01 DIAGNOSIS — Z7982 Long term (current) use of aspirin: Secondary | ICD-10-CM | POA: Insufficient documentation

## 2019-04-01 DIAGNOSIS — R531 Weakness: Secondary | ICD-10-CM | POA: Diagnosis not present

## 2019-04-01 DIAGNOSIS — Z7984 Long term (current) use of oral hypoglycemic drugs: Secondary | ICD-10-CM | POA: Insufficient documentation

## 2019-04-01 DIAGNOSIS — E039 Hypothyroidism, unspecified: Secondary | ICD-10-CM | POA: Insufficient documentation

## 2019-04-01 DIAGNOSIS — I10 Essential (primary) hypertension: Secondary | ICD-10-CM | POA: Diagnosis not present

## 2019-04-01 DIAGNOSIS — E119 Type 2 diabetes mellitus without complications: Secondary | ICD-10-CM | POA: Diagnosis not present

## 2019-04-01 LAB — HEPATIC FUNCTION PANEL
ALT: 12 U/L (ref 0–44)
AST: 18 U/L (ref 15–41)
Albumin: 4.4 g/dL (ref 3.5–5.0)
Alkaline Phosphatase: 55 U/L (ref 38–126)
Bilirubin, Direct: 0.1 mg/dL (ref 0.0–0.2)
Indirect Bilirubin: 0.7 mg/dL (ref 0.3–0.9)
Total Bilirubin: 0.8 mg/dL (ref 0.3–1.2)
Total Protein: 7.5 g/dL (ref 6.5–8.1)

## 2019-04-01 LAB — URINALYSIS, ROUTINE W REFLEX MICROSCOPIC
Bilirubin Urine: NEGATIVE
Glucose, UA: NEGATIVE mg/dL
Hgb urine dipstick: NEGATIVE
Ketones, ur: 5 mg/dL — AB
Nitrite: NEGATIVE
Protein, ur: NEGATIVE mg/dL
Specific Gravity, Urine: 1.015 (ref 1.005–1.030)
pH: 5 (ref 5.0–8.0)

## 2019-04-01 LAB — CBC
HCT: 41.7 % (ref 36.0–46.0)
Hemoglobin: 13.4 g/dL (ref 12.0–15.0)
MCH: 29.4 pg (ref 26.0–34.0)
MCHC: 32.1 g/dL (ref 30.0–36.0)
MCV: 91.4 fL (ref 80.0–100.0)
Platelets: 198 10*3/uL (ref 150–400)
RBC: 4.56 MIL/uL (ref 3.87–5.11)
RDW: 13.1 % (ref 11.5–15.5)
WBC: 9.2 10*3/uL (ref 4.0–10.5)
nRBC: 0 % (ref 0.0–0.2)

## 2019-04-01 LAB — CBG MONITORING, ED: Glucose-Capillary: 142 mg/dL — ABNORMAL HIGH (ref 70–99)

## 2019-04-01 LAB — DIFFERENTIAL
Abs Immature Granulocytes: 0.02 10*3/uL (ref 0.00–0.07)
Basophils Absolute: 0.1 10*3/uL (ref 0.0–0.1)
Basophils Relative: 1 %
Eosinophils Absolute: 0.1 10*3/uL (ref 0.0–0.5)
Eosinophils Relative: 1 %
Immature Granulocytes: 0 %
Lymphocytes Relative: 26 %
Lymphs Abs: 2.4 10*3/uL (ref 0.7–4.0)
Monocytes Absolute: 0.5 10*3/uL (ref 0.1–1.0)
Monocytes Relative: 5 %
Neutro Abs: 6.2 10*3/uL (ref 1.7–7.7)
Neutrophils Relative %: 67 %

## 2019-04-01 LAB — BASIC METABOLIC PANEL
Anion gap: 11 (ref 5–15)
BUN: 14 mg/dL (ref 8–23)
CO2: 25 mmol/L (ref 22–32)
Calcium: 9.5 mg/dL (ref 8.9–10.3)
Chloride: 105 mmol/L (ref 98–111)
Creatinine, Ser: 0.89 mg/dL (ref 0.44–1.00)
GFR calc Af Amer: >60 mL/min (ref >60)
GFR calc non Af Amer: >60 mL/min (ref >60)
Glucose, Bld: 156 mg/dL — ABNORMAL HIGH (ref 70–99)
Potassium: 3.6 mmol/L (ref 3.5–5.1)
Sodium: 141 mmol/L (ref 135–145)

## 2019-04-01 NOTE — ED Provider Notes (Signed)
[facility] DEPT Provider Note   CSN: [id number] Arrival date & time: 04/01/19  1750     History Chief Complaint  Patient presents with  . Hypertension  . Hyperglycemia    Ann Weiss is a 69 y.o. female.  Patient presents with weakness.  Patient has diabetes and hypertension.  Patient is supposed see her doctor tomorrow.  No pain no vomiting no nausea  The history is provided by the patient. No language interpreter was used.  Weakness Severity:  Moderate Onset quality:  Sudden Timing:  Constant Progression:  Worsening Chronicity:  New Context: not alcohol use   Relieved by:  Nothing Worsened by:  Nothing Ineffective treatments:  None tried Associated symptoms: no abdominal pain, no chest pain, no cough, no diarrhea, no frequency, no headaches and no seizures        Past Medical History:  Diagnosis Date  . Constipation   . Diabetes mellitus   . GERD (gastroesophageal reflux disease)   . Goiter   . Hyperlipidemia   . Hypertension   . Hypothyroidism   . Premature menopause age 77  . Vitamin D deficiency 2009    Patient Active Problem List   Diagnosis Date Noted  . Acute ischemic left ACA stroke ([place]) 11/12/2017  . GERD (gastroesophageal reflux disease) 11/23/2012  . Major depression in remission ([place]) 11/23/2012  . Vitamin D deficiency 04/24/2011  . Essential hypertension, benign 05/30/2010  . Pure hypercholesterolemia 05/30/2010  . Controlled type 2 diabetes mellitus without complication ([place]) [id number]  . Hypothyroidism 05/30/2010    Past Surgical History:  Procedure Laterality Date  . CESAREAN SECTION     x 2  . COLONOSCOPY  [id number]  . ESOPHAGOGASTRODUODENOSCOPY  [id number]  . FOOT SURGERY Right 01/2013   bone spur  . TUBAL LIGATION       OB History    Gravida  2   Para  2   Term      Preterm      AB      Living  2     SAB      TAB      Ectopic      Multiple      Live Births               Family History  Problem Relation Age of Onset  . Hypertension Mother   . Kidney disease Mother        renal failure, dialysis  . Hypertension Father   . Stroke Father   . Arthritis Maternal Aunt   . Heart disease Maternal Aunt   . Diabetes Maternal Grandmother   . Cancer Neg Hx   . Breast cancer Neg Hx   . Colon cancer Neg Hx     Social History   Tobacco Use  . Smoking status: Never Smoker  . Smokeless tobacco: Never Used  Substance Use Topics  . Alcohol use: Not Currently    Comment: socially maybe 1-2 times monthly  . Drug use: No    Home Medications Prior to Admission medications   Medication Sig Start Date End Date Taking? Authorizing Provider  ALPRAZolam Duanne Moron) 0.5 MG tablet Take 0.5 mg by mouth 3 (three) times daily as needed for anxiety.  03/10/19  Yes [provider]  amLODipine (NORVASC) 10 MG tablet Take 1 tablet (10 mg total) by mouth daily. 06/27/14  Yes Rita Ohara, MD  aspirin EC 81 MG tablet Take 81 mg by mouth daily.   Yes [provider]  escitalopram (LEXAPRO) 10 MG tablet Take 10 mg by mouth daily. 03/24/19  Yes [provider]  hydrALAZINE (APRESOLINE) 25 MG tablet Take 25 mg by mouth 2 (two) times daily. 03/26/19  Yes [provider]  levothyroxine (SYNTHROID, LEVOTHROID) 75 MCG tablet Take 75 mcg by mouth daily. 10/09/17  Yes [provider]  metFORMIN (GLUCOPHAGE) 500 MG tablet Take 1,000 mg by mouth 2 (two) times daily. 03/29/19  Yes [provider]  omeprazole (PRILOSEC) 20 MG capsule take 1 capsule by mouth once daily Patient taking differently: Take 20 mg by mouth daily as needed (indigestion).  11/29/13  Yes Rita Ohara, MD  rosuvastatin (CRESTOR) 10 MG tablet Take 10 mg by mouth daily.  11/12/18  Yes [provider]  telmisartan-hydrochlorothiazide (MICARDIS HCT) 80-25 MG tablet Take 1 tablet by mouth daily. 10/24/17  Yes [provider]  vitamin C (ASCORBIC ACID) 250 MG tablet  Take 250 mg by mouth daily.   Yes [provider]  Alogliptin Benzoate 25 MG TABS Take 1 tablet by mouth daily. Patient not taking: Reported on 04/01/2019 07/20/14   Rita Ohara, MD  BAYER CONTOUR TEST test strip TEST BLOOD SUGARS 3 TIMES DAILY 12/27/13   Rita Ohara, MD  cyclobenzaprine (FLEXERIL) 10 MG tablet Take 1 tablet (10 mg total) by mouth 3 (three) times daily as needed. Patient not taking: Reported on 04/01/2019 11/24/18   Pieter Partridge, DO  FARXIGA 5 MG TABS tablet Take 5 mg by mouth daily. 03/26/19   [provider]  LINZESS 145 MCG CAPS capsule Take 145 mcg by mouth daily. 11/23/18   [provider]  metFORMIN (GLUCOPHAGE) 1000 MG tablet take 1 tablet by mouth twice a day with food Patient not taking: Reported on 04/01/2019 06/27/14   Rita Ohara, MD  methocarbamol (ROBAXIN) 500 MG tablet Take 1 tablet (500 mg total) by mouth every 8 (eight) hours as needed for muscle spasms. Patient not taking: Reported on 11/24/2018 12/16/17   Maudie Flakes, MD    Allergies    Ciprofloxacin  Review of Systems   Review of Systems  Constitutional: Negative for appetite change and fatigue.  HENT: Negative for congestion, ear discharge and sinus pressure.   Eyes: Negative for discharge.  Respiratory: Negative for cough.   Cardiovascular: Negative for chest pain.  Gastrointestinal: Negative for abdominal pain and diarrhea.  Genitourinary: Negative for frequency and hematuria.  Musculoskeletal: Negative for back pain.  Skin: Negative for rash.  Neurological: Positive for weakness. Negative for seizures and headaches.  Psychiatric/Behavioral: Negative for hallucinations.    Physical Exam Updated Vital Signs BP (!) 168/76   Pulse 72   Temp 98.7 F (37.1 C) (Oral)   Resp 15   Ht 5\' 6"  (1.676 m)   Wt 88.2 kg   SpO2 97%   BMI 31.39 kg/m   Physical Exam Vitals and nursing note reviewed.  Constitutional:      Appearance: She is well-developed.  HENT:     Head:  Normocephalic.     Nose: Nose normal.  Eyes:     General: No scleral icterus.    Conjunctiva/sclera: Conjunctivae normal.  Neck:     Thyroid: No thyromegaly.  Cardiovascular:     Rate and Rhythm: Normal rate and regular rhythm.     Heart sounds: No murmur. No friction rub. No gallop.   Pulmonary:     Breath sounds: No stridor. No wheezing or rales.  Chest:     Chest wall:  No tenderness.  Abdominal:     General: There is no distension.     Tenderness: There is no abdominal tenderness. There is no rebound.  Musculoskeletal:        General: Normal range of motion.     Cervical back: Neck supple.  Lymphadenopathy:     Cervical: No cervical adenopathy.  Skin:    Findings: No erythema or rash.  Neurological:     Mental Status: She is oriented to person, place, and time.     Motor: No abnormal muscle tone.     Coordination: Coordination normal.  Psychiatric:        Behavior: Behavior normal.     ED Results / Procedures / Treatments   Labs (all labs ordered are listed, but only abnormal results are displayed) Labs Reviewed  BASIC METABOLIC PANEL - Abnormal; Notable for the following components:      Result Value   Glucose, Bld 156 (*)    All other components within normal limits  URINALYSIS, ROUTINE W REFLEX MICROSCOPIC - Abnormal; Notable for the following components:   Ketones, ur 5 (*)    Leukocytes,Ua TRACE (*)    Bacteria, UA RARE (*)    All other components within normal limits  CBG MONITORING, ED - Abnormal; Notable for the following components:   Glucose-Capillary 142 (*)    All other components within normal limits  CBC  HEPATIC FUNCTION PANEL  DIFFERENTIAL    EKG None  Radiology No results found.  Procedures Procedures (including critical care time)  Medications Ordered in ED Medications - No data to display  ED Course  I have reviewed the triage vital signs and the nursing notes.  Pertinent labs & imaging results that were available during my  care of the patient were reviewed by me and considered in my medical decision making (see chart for details).    MDM Rules/Calculators/A&P                     Patient with hypertension diabetes general weakness.  Labs unremarkable.  Patient will go home and follow-up with her PCP tomorrow Final Clinical Impression(s) / ED Diagnoses Final diagnoses:  Weakness    Rx / DC Orders ED Discharge Orders    None       Milton Ferguson, MD 04/01/19 2303

## 2019-04-01 NOTE — Discharge Instructions (Addendum)
Follow-up with your doctor tomorrow as planned 

## 2019-04-01 NOTE — ED Triage Notes (Signed)
Pt reports that after her covid vaccine 2/19 had issues with her blood pressure being high as well as her blood sugar. Has HTN and diabetes. Also having fatigue.

## 2019-04-02 ENCOUNTER — Other Ambulatory Visit: Payer: Self-pay | Admitting: Physician Assistant

## 2019-04-02 DIAGNOSIS — R1013 Epigastric pain: Secondary | ICD-10-CM

## 2019-04-13 ENCOUNTER — Ambulatory Visit
Admission: RE | Admit: 2019-04-13 | Discharge: 2019-04-13 | Disposition: A | Discharge status: Home / Self Care | Payer: No Typology Code available for payment source | Source: Ambulatory Visit | Attending: Physician Assistant | Admitting: Physician Assistant

## 2019-04-13 ENCOUNTER — Other Ambulatory Visit: Payer: Self-pay

## 2019-04-13 DIAGNOSIS — R1013 Epigastric pain: Secondary | ICD-10-CM

## 2019-04-13 IMAGING — RF DG UGI W/ HIGH DENSITY W/O KUB
9 series · 14 of 24 positions shown · non-contrast
Comparison: 09/24/2018 abdominal radiograph.

CLINICAL DATA: Chronic epigastric abdominal pain and
gastroesophageal reflux disease.

EXAM:
UPPER GI SERIES WITH KUB
TECHNIQUE: After obtaining a scout radiograph a routine upper GI series was
performed using thin and high density barium.
FLUOROSCOPY TIME:  Fluoroscopy Time:  2 minutes 12 seconds
Number of Acquired Spot Images: 13

[Series 1: one shot · 0.14mm/px · 3 of 8 slices shown (1 of 4)]
[im 1/8]
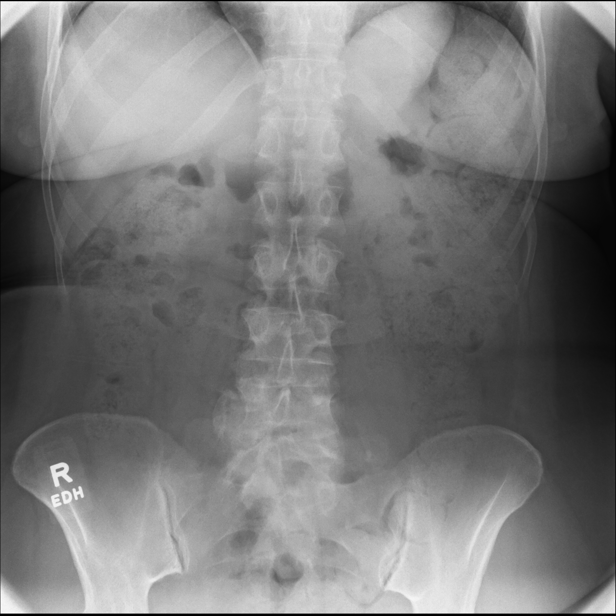
[im 4/8]
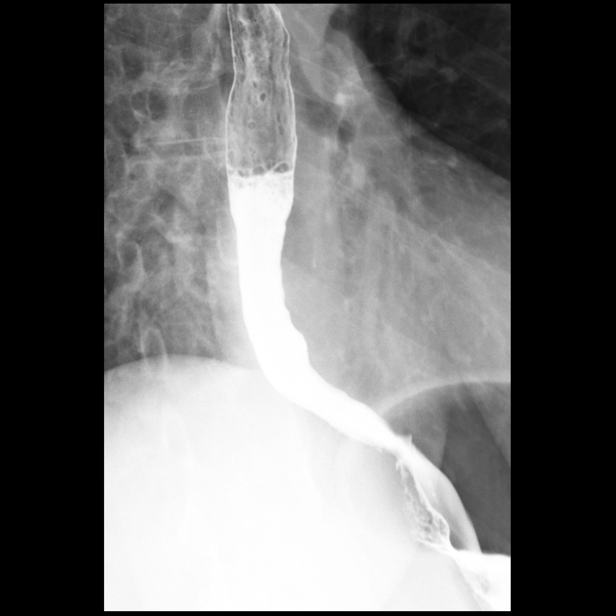
[im 6/8]
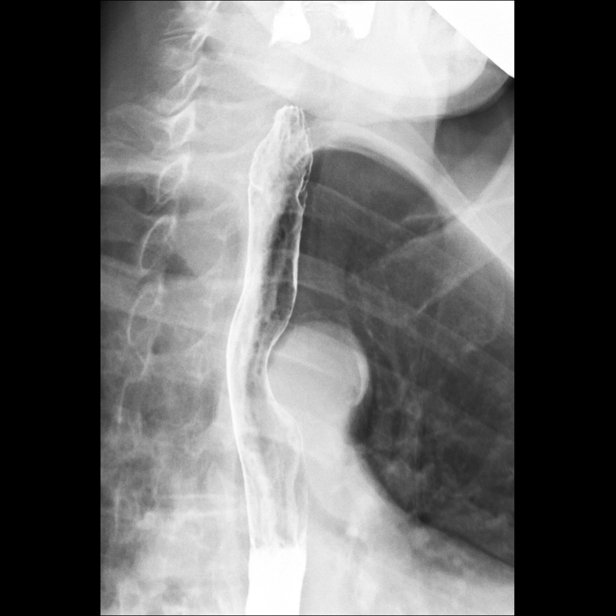

[Series 2: sequence · 2 of 5 frames shown (1 of 5)]
[frame 1/5]
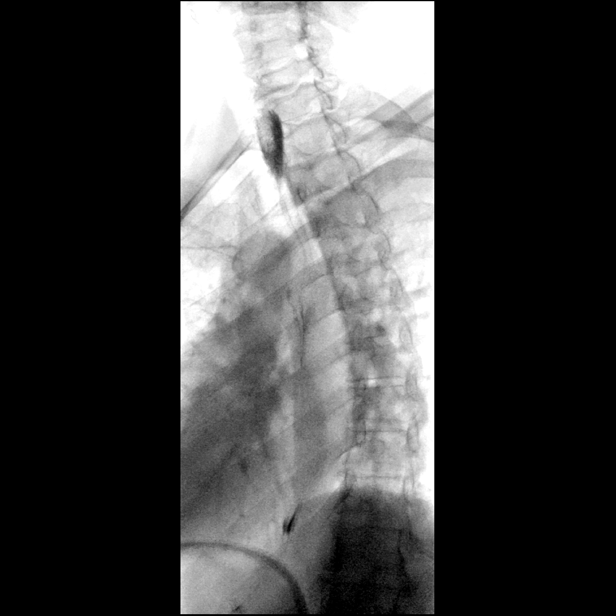
[frame 3/5]
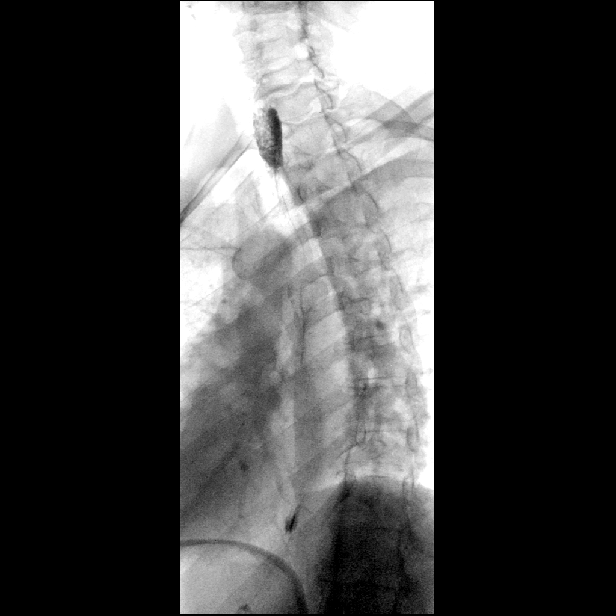

[Series 3: sequence · 1 of 34 frames shown (2 of 5)]
[frame 18/34]
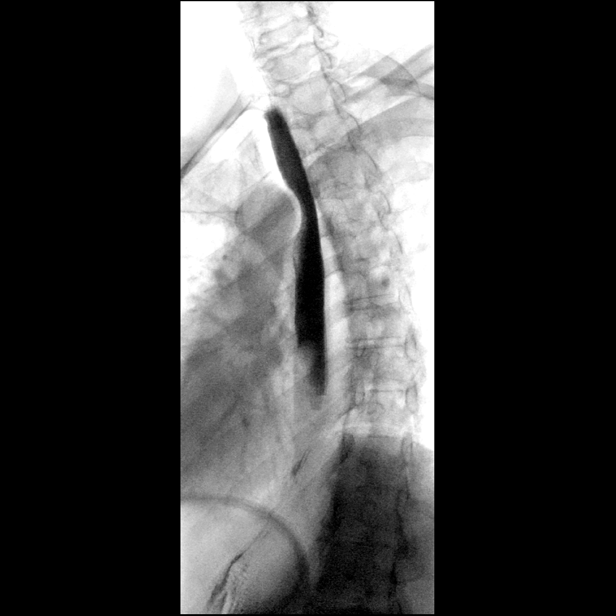

[Series 4: sequence · 2 of 72 frames shown (3 of 5)]
[frame 11/72]
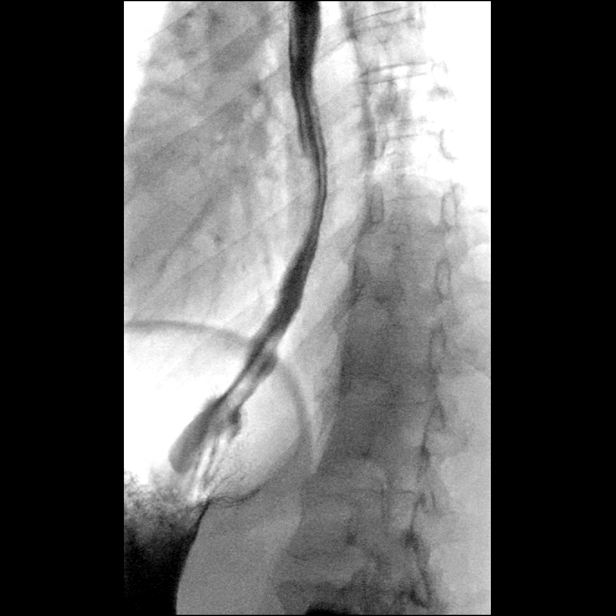
[frame 37/72]
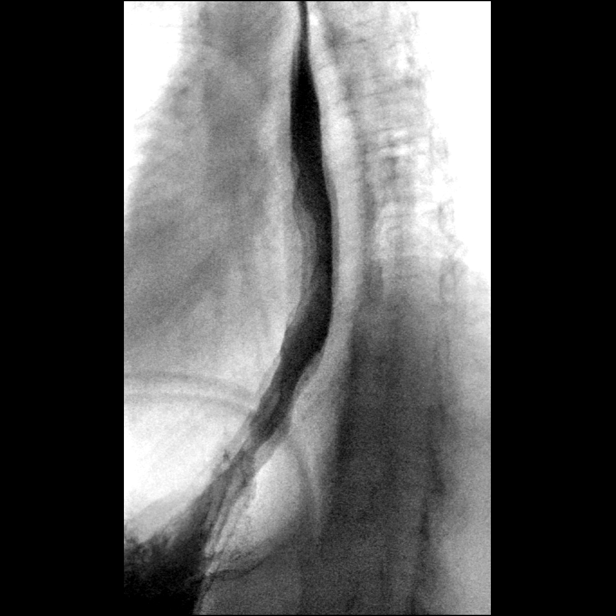

[Series 5: one shot · 0.16mm/px · 2 of 5 slices shown (2 of 4)]
[im 2/5]
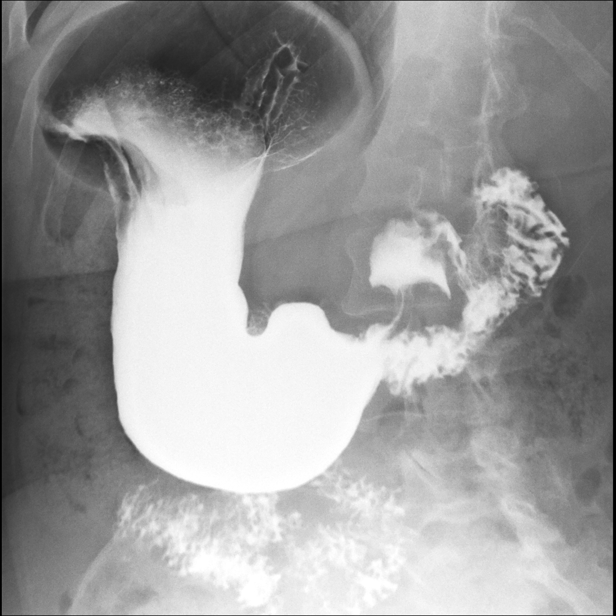
[im 5/5]
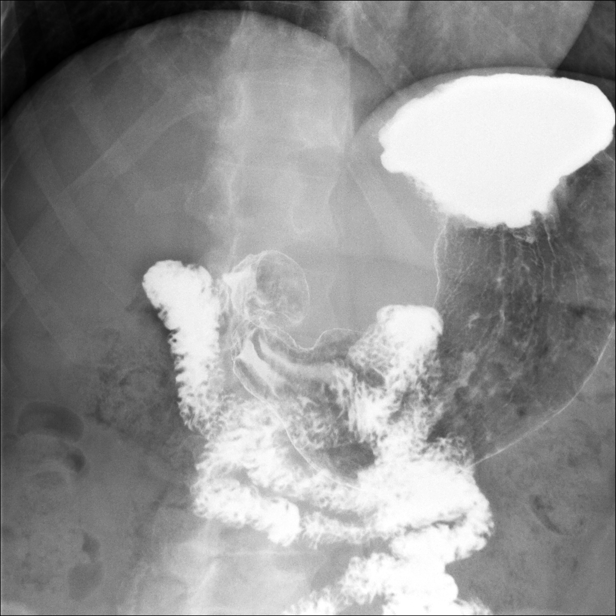

[Series 6: sequence · 1 of 5 frames shown (4 of 5)]
[frame 3/5]
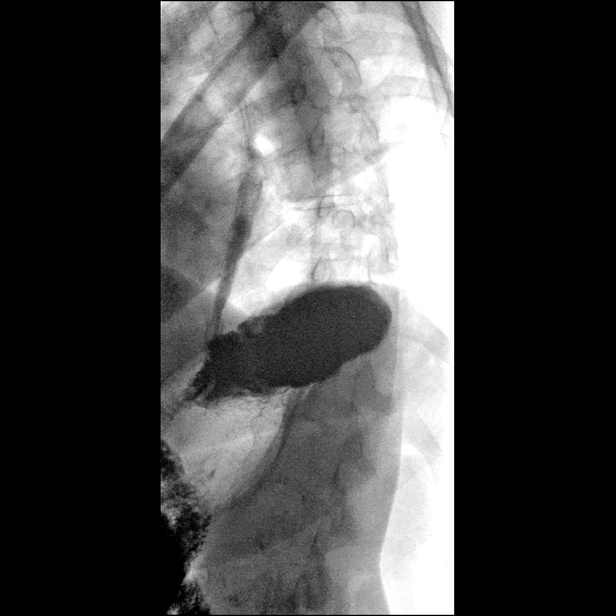

[Series 7: one shot · 0.15mm/px · 1 of 1 slices shown (3 of 4)]
[im 1/1]
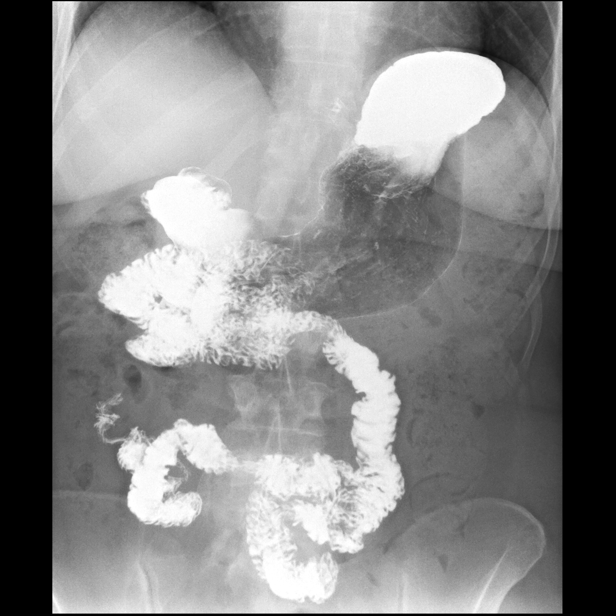

[Series 8: sequence · 1 of 42 frames shown (5 of 5)]
[frame 22/42]
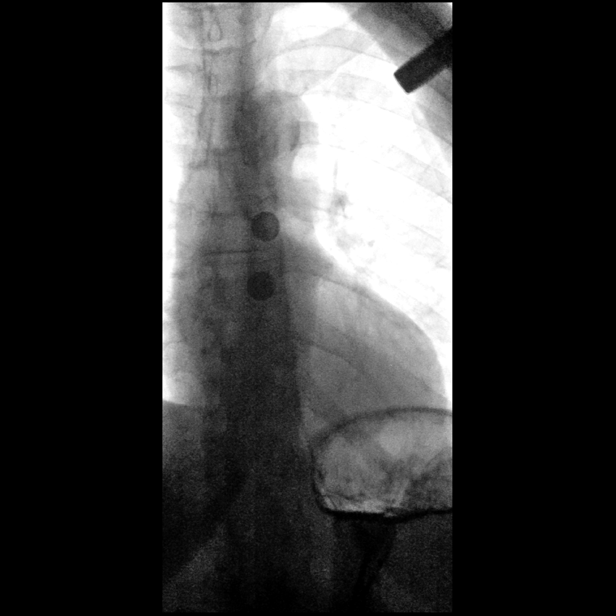

[Series 9: one shot · 1 of 1 slices shown (4 of 4)]
[im 1/1]
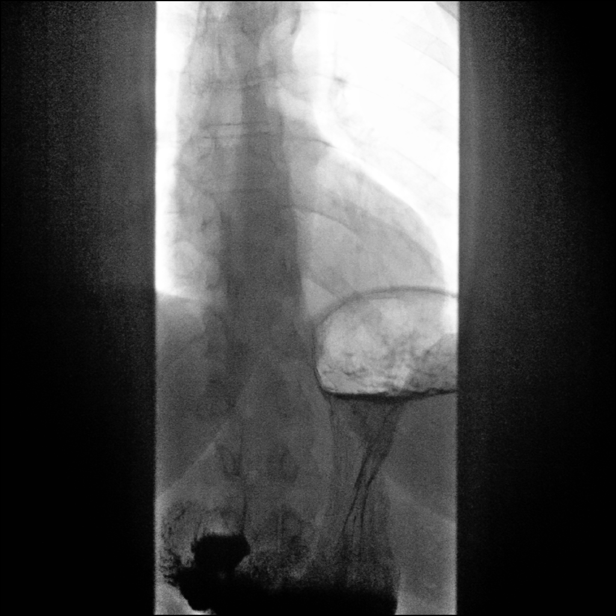

[14 of 24 positions shown; findings below may reference images not displayed]

FINDINGS: Scout radiograph demonstrates no dilated small bowel loops. Moderate
colonic stool. No evidence of pneumatosis or pneumoperitoneum. No
radiopaque nephrolithiasis.

No hiatal hernia. Mild-to-moderate gastroesophageal reflux elicited
to the level of the midthoracic esophagus with water siphon test.
Mildly granular esophageal mucosa suggests mild reflux esophagitis.
No evidence of esophageal mass, ulcer or stricture. Barium tablet
traversed the esophagus into the stomach without delay. Mild
esophageal dysmotility with proximal escape. Normal gastric
emptying. No gastric fold thickening, filling defects, ulcers or
significant erosions. Normal duodenal bulb and C sweep, with no
duodenal fold thickening, filling defects, ulcers or strictures.
Normal duodenal jejunal junction to the left of the spine.
Visualized proximal jejunal loops are normal caliber and without
fold thickening.
IMPRESSION: 1. Mild-to-moderate gastroesophageal reflux.  No hiatal hernia.
2. Mild esophageal dysmotility, with presbyesophagus dysmotility
pattern.
3. Suggestion of mild reflux esophagitis. No evidence of esophageal
mass or stricture.

## 2019-04-15 ENCOUNTER — Encounter: Payer: Self-pay | Admitting: Cardiovascular Disease

## 2019-04-15 ENCOUNTER — Ambulatory Visit (INDEPENDENT_AMBULATORY_CARE_PROVIDER_SITE_OTHER): Payer: No Typology Code available for payment source | Admitting: Cardiovascular Disease

## 2019-04-15 ENCOUNTER — Encounter: Payer: Self-pay | Admitting: *Deleted

## 2019-04-15 ENCOUNTER — Other Ambulatory Visit: Payer: Self-pay

## 2019-04-15 VITALS — BP 160/70 | HR 77 | Ht 66.0 in | Wt 196.8 lb

## 2019-04-15 DIAGNOSIS — I10 Essential (primary) hypertension: Secondary | ICD-10-CM | POA: Diagnosis not present

## 2019-04-15 DIAGNOSIS — E782 Mixed hyperlipidemia: Secondary | ICD-10-CM | POA: Diagnosis not present

## 2019-04-15 DIAGNOSIS — R002 Palpitations: Secondary | ICD-10-CM | POA: Diagnosis not present

## 2019-04-15 MED ORDER — ROSUVASTATIN CALCIUM 40 MG PO TABS: 40.0000 mg | ORAL_TABLET | Freq: Every day | ORAL | 1 refills | Status: DC

## 2019-04-15 NOTE — Progress Notes (Signed)
Cardiology Office Note:   Date:  04/15/2019  NAME:  Ann Weiss    MRN: [id number] DOB:  1950-08-26   PCP:  Seward Carol, MD  Cardiologist:  No primary care provider on file.   Referring MD: Seward Carol, MD   Chief Complaint  Patient presents with  . Palpitations   History of Present Illness:   Ann Weiss is a 69 y.o. female with a hx of HTN, anxiety, diabetes, CVA who is being seen today for the evaluation of palpitations at the request of Seward Carol, MD.  She reports for the past 1 to 2 months has had intermittent episodes of palpitations.  They occur 3-4 times per week.  They can happen at any time.  No identifiable trigger.  They last around 20 minutes and resolve with deep breathing.  She reports significant stress in her life.  Her stepdaughter was recently found dead in her house.  She reports that the reminder is there daily and this is rather difficult for her.  This is also coincided with recent increases in blood pressure.  She apparently did get vaccinated with her second coronavirus shot on February 19 and this seems to have set off episodes of high blood pressure.  She reports her blood pressure ranges in the 200s at times and she is recently been evaluated and started on hydralazine.  She reports that stress does make her symptoms of palpitations worse.  She is concerned she may have a heart issue.  I did review her lab work from her primary care physician office which shows severely elevated LDL cholesterol.  Not at goal for her diabetes as well as prior stroke.  No recent thyroid studies that I can see.  Recent CBC shows hemoglobin 13.4.  She denies excess caffeine use.  She does not use illicit drugs or use tobacco products.  She does not consume excess alcohol.  Both of her parents had heart disease per her report.  She also had a stroke in 2019 and this is left her with right-sided weakness.  Symptoms are bothersome to her and she wants to make sure her palpitations  or not an issue for her.  She denies any chest pain or shortness of breath today.  Labs from primary care physician office show total cholesterol 199, HDL 56, LDL 178, triglycerides 125, A1c 7.7, hemoglobin 13.4, creatinine 0.89, TSH 4.3  Past Medical History: Past Medical History:  Diagnosis Date  . Constipation   . Diabetes mellitus   . GERD (gastroesophageal reflux disease)   . Goiter   . Hyperlipidemia   . Hypertension   . Hypothyroidism   . Premature menopause age 53  . Stroke ([place])   . Vitamin D deficiency 2009    Past Surgical History: Past Surgical History:  Procedure Laterality Date  . CESAREAN SECTION     x 2  . COLONOSCOPY  [id number]  . ESOPHAGOGASTRODUODENOSCOPY  [id number]  . FOOT SURGERY Right 01/2013   bone spur  . TUBAL LIGATION      Current Medications: Current Meds  Medication Sig  . ALPRAZolam (XANAX) 0.5 MG tablet Take 0.5 mg by mouth 3 (three) times daily as needed for anxiety.   Marland Kitchen amLODipine (NORVASC) 10 MG tablet Take 1 tablet (10 mg total) by mouth daily.  Marland Kitchen aspirin EC 81 MG tablet Take 81 mg by mouth daily.  Marland Kitchen BAYER CONTOUR TEST test strip TEST BLOOD SUGARS 3 TIMES DAILY  . cyclobenzaprine (FLEXERIL) 10 MG tablet Take 10 mg  by mouth 3 (three) times daily as needed for muscle spasms.  . hydrALAZINE (APRESOLINE) 50 MG tablet Take 50 mg by mouth 2 (two) times daily.   Marland Kitchen levothyroxine (SYNTHROID, LEVOTHROID) 75 MCG tablet Take 75 mcg by mouth daily.  Marland Kitchen LINZESS 145 MCG CAPS capsule Take 145 mcg by mouth daily.  . meloxicam (MOBIC) 15 MG tablet Take 15 mg by mouth daily as needed.  . metFORMIN (GLUCOPHAGE) 1000 MG tablet take 1 tablet by mouth twice a day with food  . omeprazole (PRILOSEC) 20 MG capsule take 1 capsule by mouth once daily  . rosuvastatin (CRESTOR) 40 MG tablet Take 1 tablet (40 mg total) by mouth daily.  Marland Kitchen telmisartan-hydrochlorothiazide (MICARDIS HCT) 80-25 MG tablet Take 1 tablet by mouth daily.  . vitamin C (ASCORBIC ACID) 250 MG tablet  Take 250 mg by mouth daily.  . [DISCONTINUED] cyclobenzaprine (FLEXERIL) 10 MG tablet Take 1 tablet (10 mg total) by mouth 3 (three) times daily as needed.  . [DISCONTINUED] escitalopram (LEXAPRO) 10 MG tablet Take 10 mg by mouth daily.  . [DISCONTINUED] escitalopram (LEXAPRO) 10 MG tablet Take 10 mg by mouth daily. Take 1 tablet by mouth daly for 30 days. Start 03/24/2019  . [DISCONTINUED] FARXIGA 5 MG TABS tablet Take 5 mg by mouth daily.  . [DISCONTINUED] metFORMIN (GLUCOPHAGE) 500 MG tablet Take 1,000 mg by mouth 2 (two) times daily.  . [DISCONTINUED] rosuvastatin (CRESTOR) 10 MG tablet Take 10 mg by mouth daily.      Allergies:    Ciprofloxacin   Social History: Social History   Socioeconomic History  . Marital status: Married    Spouse name: Sonia Side  . Number of children: 2  . Years of education: Not on file  . Highest education level: Bachelor's degree (e.g., BA, AB, BS)  Occupational History  . Occupation: Retired from [employer]: RETIRED  Tobacco Use  . Smoking status: Never Smoker  . Smokeless tobacco: Never Used  Substance and Sexual Activity  . Alcohol use: Not Currently    Comment: socially maybe 1-2 times monthly  . Drug use: No  . Sexual activity: Not Currently  Other Topics Concern  . Not on file  Social History Narrative   Lives alone.  Daughter lives in [place] and other daughter lives in [place], [place]. 4 grandchildren      Patient is right-handed. She lives with her husband in a two level home. She drinks 2 cups of coffee a day, she does not exercise..   Social Determinants of Health   Financial Resource Strain:   . Difficulty of Paying Living Expenses:   Food Insecurity:   . Worried About Charity fundraiser in the Last Year:   . Arboriculturist in the Last Year:   Transportation Needs:   . Film/video editor (Medical):   Marland Kitchen Lack of Transportation (Non-Medical):   Physical Activity:   . Days of Exercise per Week:   . Minutes of Exercise per Session:    Stress:   . Feeling of Stress :   Social Connections:   . Frequency of Communication with Friends and Family:   . Frequency of Social Gatherings with Friends and Family:   . Attends Religious Services:   . Active Member of Clubs or Organizations:   . Attends Archivist Meetings:   Marland Kitchen Marital Status:      Family History: The patient's family history includes Arthritis in her maternal aunt; Diabetes in her maternal  grandmother; Heart disease in her father, maternal aunt, and mother; Hypertension in her father and mother; Kidney disease in her mother; Stroke in her father. There is no history of Cancer, Breast cancer, or Colon cancer.  ROS:   All other ROS reviewed and negative. Pertinent positives noted in the HPI.     EKGs/Labs/Other Studies Reviewed:   The following studies were personally reviewed by me today:  EKG:  EKG is ordered today.  The ekg ordered today demonstrates normal sinus rhythm, heart rate 77, poor R wave progression, and was personally reviewed by me.   Recent Labs: 04/01/2019: ALT 12; BUN 14; Creatinine, Ser 0.89; Hemoglobin 13.4; Platelets 198; Potassium 3.6; Sodium 141   Recent Lipid Panel    Component Value Date/Time   CHOL 152 11/13/2017 0312   TRIG 85 11/13/2017 0312   HDL 50 11/13/2017 0312   CHOLHDL 3.0 11/13/2017 0312   VLDL 17 11/13/2017 0312   LDLCALC 85 11/13/2017 0312    Physical Exam:   VS:  BP (!) 160/70 Comment: right arm  Pulse 77   Ht 5\' 6"  (1.676 m)   Wt 196 lb 12.8 oz (89.3 kg)   SpO2 99%   BMI 31.76 kg/m    Wt Readings from Last 3 Encounters:  04/15/19 196 lb 12.8 oz (89.3 kg)  04/01/19 194 lb 8 oz (88.2 kg)  07/29/18 220 lb (99.8 kg)    General: Well nourished, well developed, in no acute distress Heart: Atraumatic, normal size  Eyes: PEERLA, EOMI  Neck: Supple, no JVD Endocrine: No thryomegaly Cardiac: Normal S1, S2; RRR; no murmurs, rubs, or gallops Lungs: Clear to auscultation bilaterally, no wheezing,  rhonchi or rales  Abd: Soft, nontender, no hepatomegaly  Ext: No edema, pulses 2+ Musculoskeletal: No deformities, BUE and BLE strength normal and equal Skin: Warm and dry, no rashes   Neuro: Alert and oriented to person, place, time, and situation, CNII-XII grossly intact, no focal deficits  Psych: Normal mood and affect   ASSESSMENT:   Lakelynn Rasor is a 69 y.o. female who presents for the following: 1. Palpitations   2. Essential hypertension   3. Mixed hyperlipidemia     PLAN:   1. Palpitations -Unclear etiology.  Suspect this is anxiety related.  She is had a lot of stressors in her life recently with the death of her stepdaughter who was found in her home.  We will obtain a TSH today.  She is not anemic on review of recent lab work.  Kidney function normal.  We will obtain a 7-day Zio patch to exclude any significant arrhythmia. -No murmurs and normal cardiovascular examination.  No need for echocardiogram at this time.  Her EKG shows left axis deviation which is likely related to hypertension.  2. Essential hypertension -A bit elevated today.  There may be some whitecoat hypertension here.  She reports her blood pressure was 138/63 this morning.  No change in medications today.  3. Mixed hyperlipidemia -Most recent LDL cholesterol is not at goal.  09/24/2018 LDL was 178.  We will increase her Crestor to 40 mg daily.  We will see her back for a 45-month virtual visit in 1 week before that she will give [place] a fasting lipid profile.  We will further titrate depending on the values at that time.  Disposition: Return in about 2 months (around 06/15/2019).  Medication Adjustments/Labs and Tests Ordered: Current medicines are reviewed at length with the patient today.  Concerns regarding medicines are outlined above.  Orders Placed This Encounter  Procedures  . TSH  . Lipid panel  . LONG TERM MONITOR (3-14 DAYS)  . EKG 12-Lead   Meds ordered this encounter  Medications  .  rosuvastatin (CRESTOR) 40 MG tablet    Sig: Take 1 tablet (40 mg total) by mouth daily.    Dispense:  90 tablet    Refill:  1    Patient Instructions  Medication Instructions:  Increase Crestor 40 mg  *If you need a refill on your cardiac medications before your next appointment, please call your pharmacy*   Lab Work: TSH (come anytime) LIPID (fasting, one week before 2 month follow up)   If you have labs (blood work) drawn today and your tests are completely normal, you will receive your results only by: Marland Kitchen MyChart Message (if you have MyChart) OR . A paper copy in the mail If you have any lab test that is abnormal or we need to change your treatment, we will call you to review the results.   Testing/Procedures: Your physician has recommended that you wear a 7 DAY ZIO-PATCH monitor. The Zio patch cardiac monitor continuously records heart rhythm data for up to 14 days, this is for patients being evaluated for multiple types heart rhythms. For the first 24 hours post application, please avoid getting the Zio monitor wet in the shower or by excessive sweating during exercise. After that, feel free to carry on with regular activities. Keep soaps and lotions away from the ZIO XT Patch.  This will be mailed to you, please expect 7-10 days to receive.          Follow-Up: At [facility], you and your health needs are our priority.  As part of our continuing mission to provide you with exceptional heart care, we have created designated Provider Care Teams.  These Care Teams include your primary Cardiologist (physician) and Advanced Practice Providers (APPs -  Physician Assistants and Nurse Practitioners) who all work together to provide you with the care you need, when you need it.  We recommend signing up for the patient portal called "MyChart".  Sign up information is provided on this After Visit Summary.  MyChart is used to connect with patients for Virtual Visits (Telemedicine).   Patients are able to view lab/test results, encounter notes, upcoming appointments, etc.  Non-urgent messages can be sent to your provider as well.   To learn more about what you can do with MyChart, go to [email or web address].    Your next appointment:   2 month(s)  The format for your next appointment:   Virtual Visit   Provider:   Eleonore Chiquito, MD       Signed, Addison Naegeli. Audie Box, [place]  [address] [place], [place] [address] [phone]  04/15/2019 5:26 PM

## 2019-04-15 NOTE — Progress Notes (Signed)
Patient ID: Ann Weiss, female   DOB: 19-Mar-1950, 69 y.o.   MRN: [id number] Patient enrolled for [employer] to mail a 7 day ZIO XT long term holter monitor to be mail to her home.

## 2019-04-15 NOTE — Patient Instructions (Signed)
Medication Instructions:  Increase Crestor 40 mg  *If you need a refill on your cardiac medications before your next appointment, please call your pharmacy*   Lab Work: TSH (come anytime) LIPID (fasting, one week before 2 month follow up)   If you have labs (blood work) drawn today and your tests are completely normal, you will receive your results only by: Marland Kitchen MyChart Message (if you have MyChart) OR . A paper copy in the mail If you have any lab test that is abnormal or we need to change your treatment, we will call you to review the results.   Testing/Procedures: Your physician has recommended that you wear a 7 DAY ZIO-PATCH monitor. The Zio patch cardiac monitor continuously records heart rhythm data for up to 14 days, this is for patients being evaluated for multiple types heart rhythms. For the first 24 hours post application, please avoid getting the Zio monitor wet in the shower or by excessive sweating during exercise. After that, feel free to carry on with regular activities. Keep soaps and lotions away from the ZIO XT Patch.  This will be mailed to you, please expect 7-10 days to receive.          Follow-Up: At [facility], you and your health needs are our priority.  As part of our continuing mission to provide you with exceptional heart care, we have created designated Provider Care Teams.  These Care Teams include your primary Cardiologist (physician) and Advanced Practice Providers (APPs -  Physician Assistants and Nurse Practitioners) who all work together to provide you with the care you need, when you need it.  We recommend signing up for the patient portal called "MyChart".  Sign up information is provided on this After Visit Summary.  MyChart is used to connect with patients for Virtual Visits (Telemedicine).  Patients are able to view lab/test results, encounter notes, upcoming appointments, etc.  Non-urgent messages can be sent to your provider as well.   To learn  more about what you can do with MyChart, go to [email or web address].    Your next appointment:   2 month(s)  The format for your next appointment:   Virtual Visit   Provider:   Eleonore Chiquito, MD

## 2019-04-21 ENCOUNTER — Ambulatory Visit (INDEPENDENT_AMBULATORY_CARE_PROVIDER_SITE_OTHER): Payer: No Typology Code available for payment source

## 2019-04-21 DIAGNOSIS — R002 Palpitations: Secondary | ICD-10-CM | POA: Diagnosis not present

## 2019-04-29 ENCOUNTER — Other Ambulatory Visit: Payer: Self-pay | Admitting: Internal Medicine

## 2019-04-29 DIAGNOSIS — Z1231 Encounter for screening mammogram for malignant neoplasm of breast: Secondary | ICD-10-CM

## 2019-04-30 LAB — TSH: TSH: 4.5 u[IU]/mL (ref 0.450–4.500)

## 2019-05-05 ENCOUNTER — Other Ambulatory Visit: Payer: Self-pay

## 2019-05-05 ENCOUNTER — Ambulatory Visit
Admission: RE | Admit: 2019-05-05 | Discharge: 2019-05-05 | Disposition: A | Discharge status: Home / Self Care | Payer: No Typology Code available for payment source | Source: Ambulatory Visit | Attending: Internal Medicine | Admitting: Internal Medicine

## 2019-05-05 DIAGNOSIS — Z1231 Encounter for screening mammogram for malignant neoplasm of breast: Secondary | ICD-10-CM

## 2019-05-05 IMAGING — MG DIGITAL SCREENING BILAT W/ TOMO W/ CAD
6 of 12 series · 6 of 36 positions shown · non-contrast
Comparison: Previous exam(s).

CLINICAL DATA: Screening.

EXAM:
DIGITAL SCREENING BILATERAL MAMMOGRAM WITH TOMO AND CAD

[R MLO synth-2D]
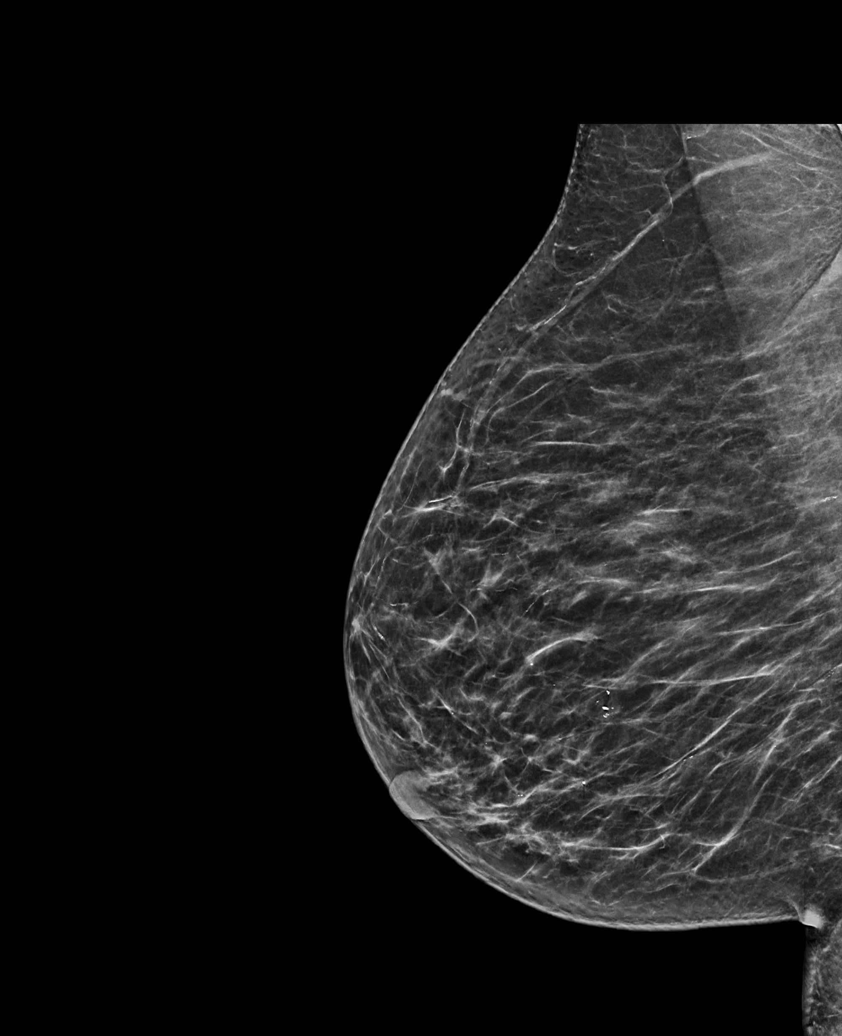

[R CC synth-2D (1 of 2)]
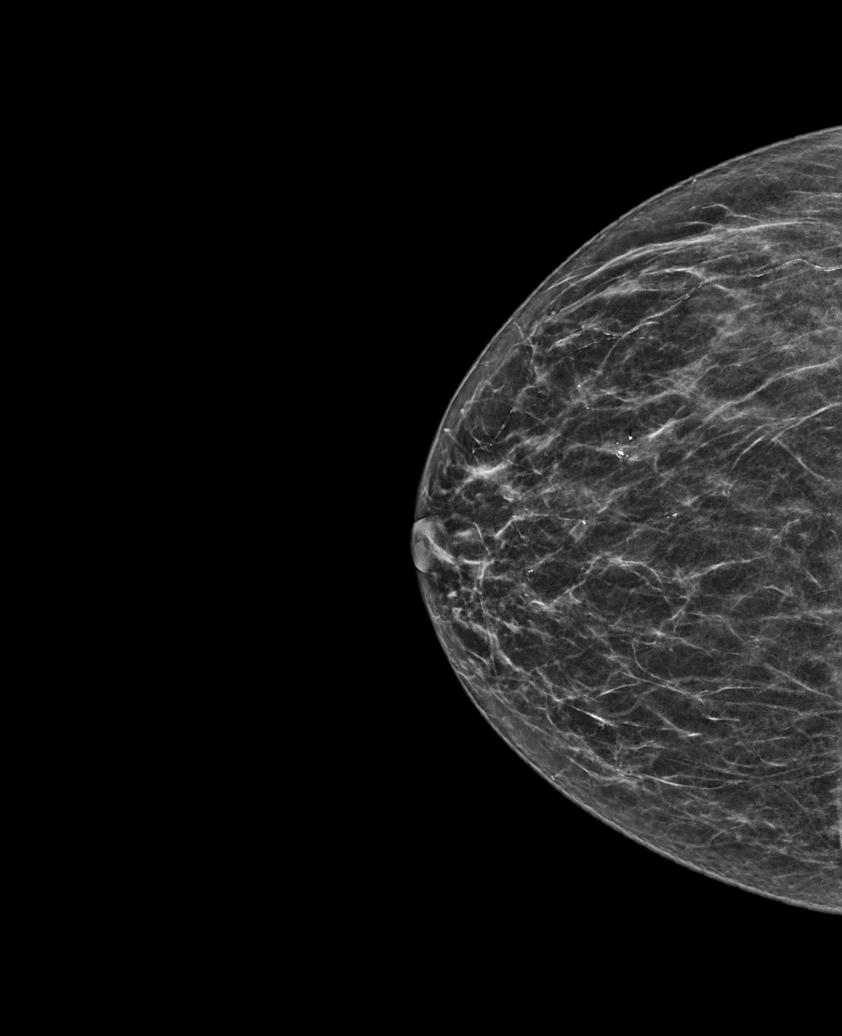

[L CC synth-2D (1 of 2)]
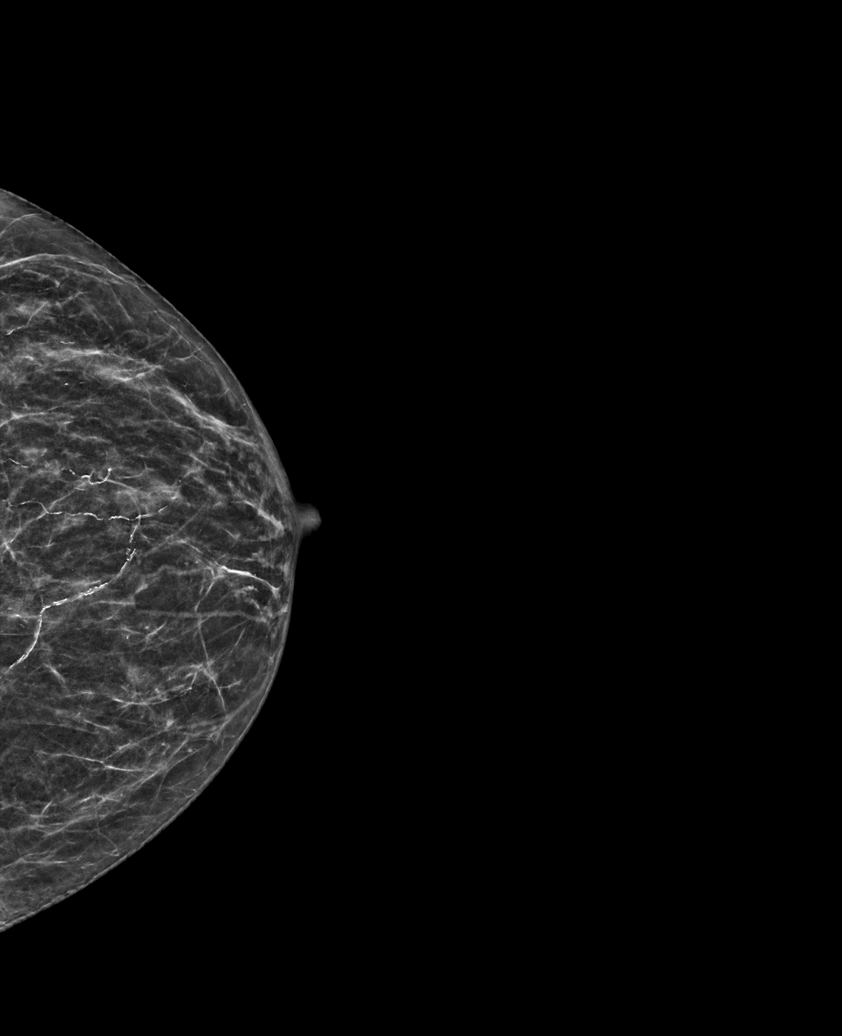

[L MLO synth-2D]
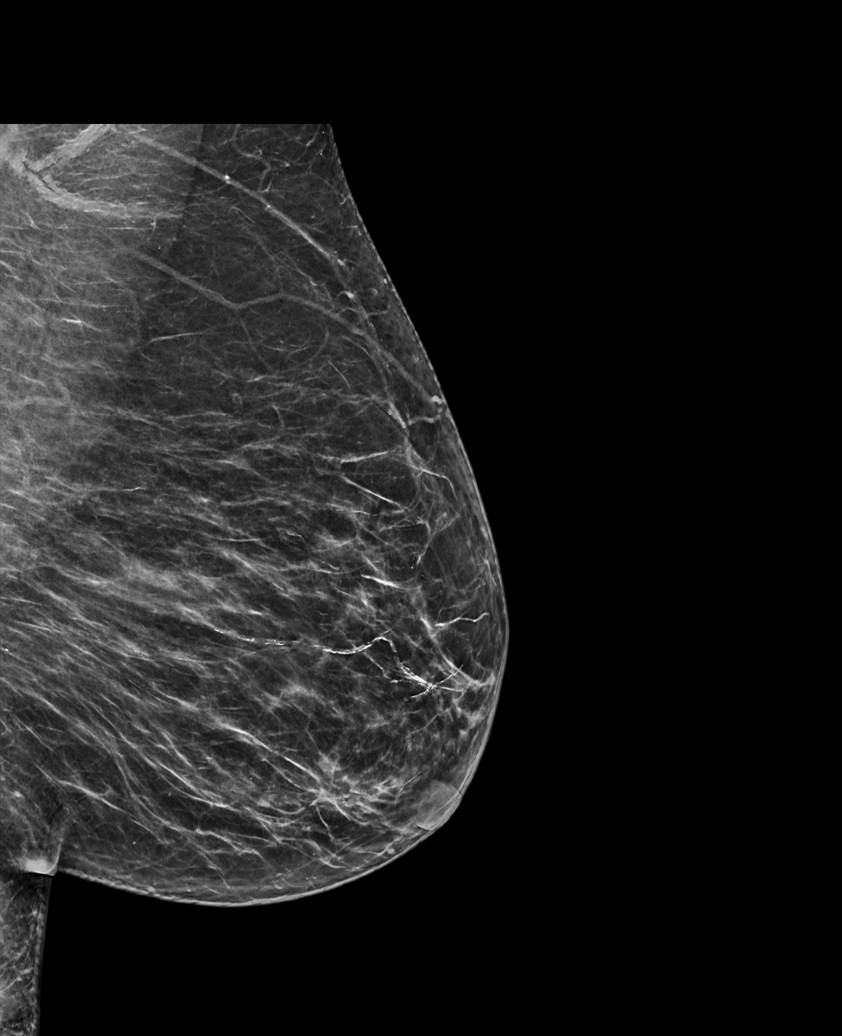

[R CC synth-2D (2 of 2)]
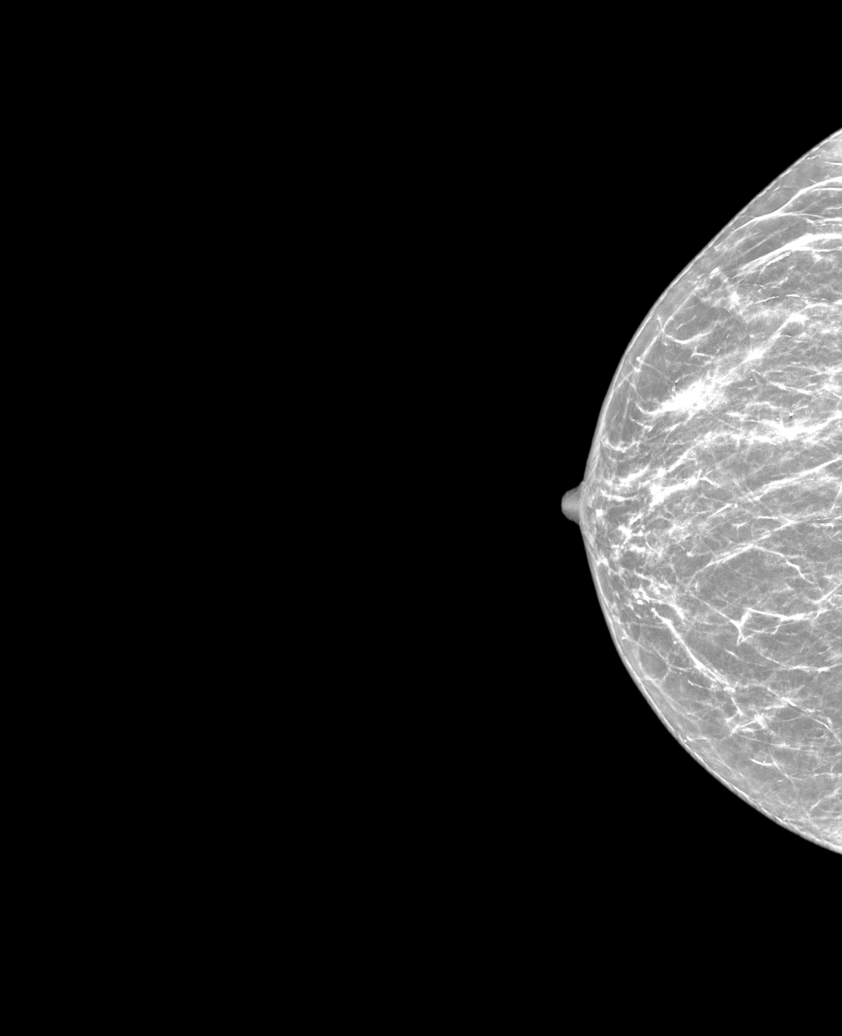

[L CC synth-2D (2 of 2)]
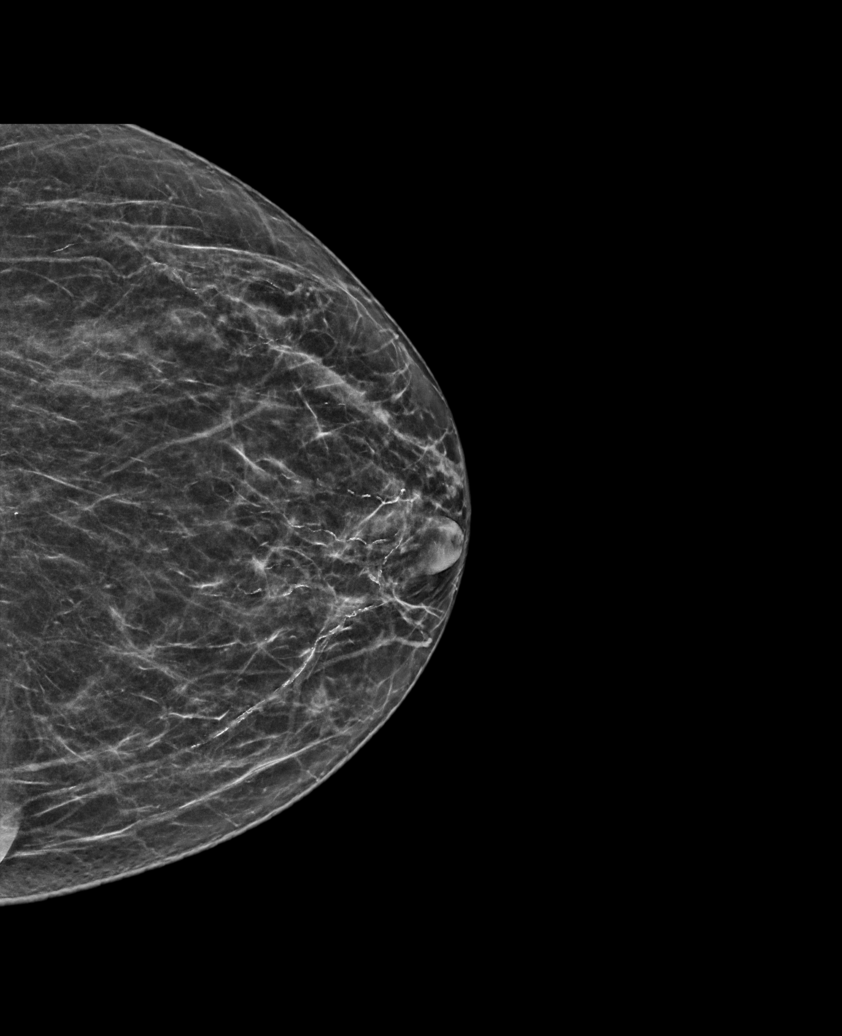

[6 of 36 positions shown; findings below may reference images not displayed]

ACR Breast Density Category b: There are scattered areas of
fibroglandular density.
FINDINGS: In the right breast, calcifications warrant further evaluation with
magnified views. In the left breast, no findings suspicious for
malignancy. Images were processed with CAD.
IMPRESSION: Further evaluation is suggested for calcifications in the right
breast.

RECOMMENDATION:
Diagnostic mammogram of the right breast. (Code:[id number])

The patient will be contacted regarding the findings, and additional
imaging will be scheduled.

BI-RADS CATEGORY  0: Incomplete. Need additional imaging evaluation
and/or prior mammograms for comparison.

## 2019-05-06 ENCOUNTER — Other Ambulatory Visit: Payer: Self-pay | Admitting: Internal Medicine

## 2019-05-06 DIAGNOSIS — R928 Other abnormal and inconclusive findings on diagnostic imaging of breast: Secondary | ICD-10-CM

## 2019-05-14 ENCOUNTER — Other Ambulatory Visit: Payer: Self-pay

## 2019-05-14 ENCOUNTER — Ambulatory Visit
Admission: RE | Admit: 2019-05-14 | Discharge: 2019-05-14 | Disposition: A | Discharge status: Home / Self Care | Payer: No Typology Code available for payment source | Source: Ambulatory Visit | Attending: Internal Medicine | Admitting: Internal Medicine

## 2019-05-14 ENCOUNTER — Other Ambulatory Visit: Payer: Self-pay | Admitting: Internal Medicine

## 2019-05-14 DIAGNOSIS — R921 Mammographic calcification found on diagnostic imaging of breast: Secondary | ICD-10-CM

## 2019-05-14 DIAGNOSIS — R928 Other abnormal and inconclusive findings on diagnostic imaging of breast: Secondary | ICD-10-CM

## 2019-05-14 IMAGING — MG DIGITAL DIAGNOSTIC UNILAT RIGHT W/ CAD
3 series · 3 of 3 positions shown · non-contrast
Comparison: Previous exam(s).

CLINICAL DATA: 68-year-old female presenting as a recall from
screening for possible right breast calcifications.

EXAM:
DIGITAL DIAGNOSTIC RIGHT MAMMOGRAM

[R CC]
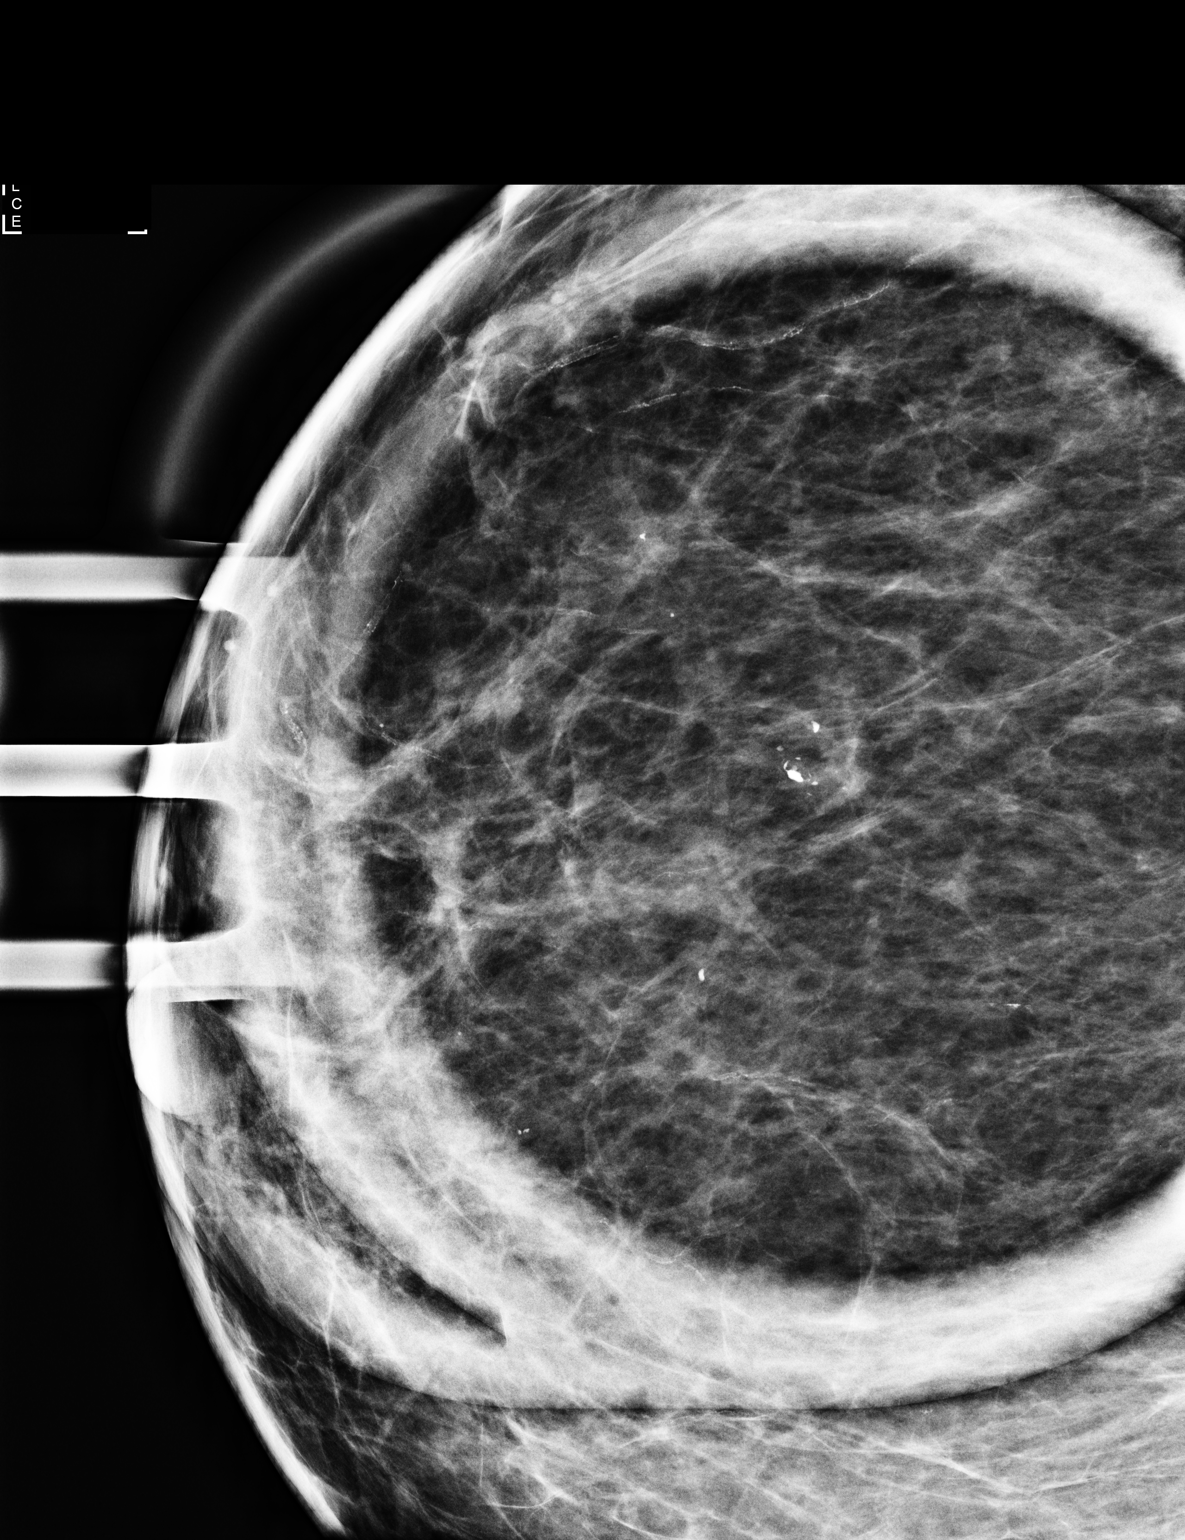

[R ML (1 of 2)]
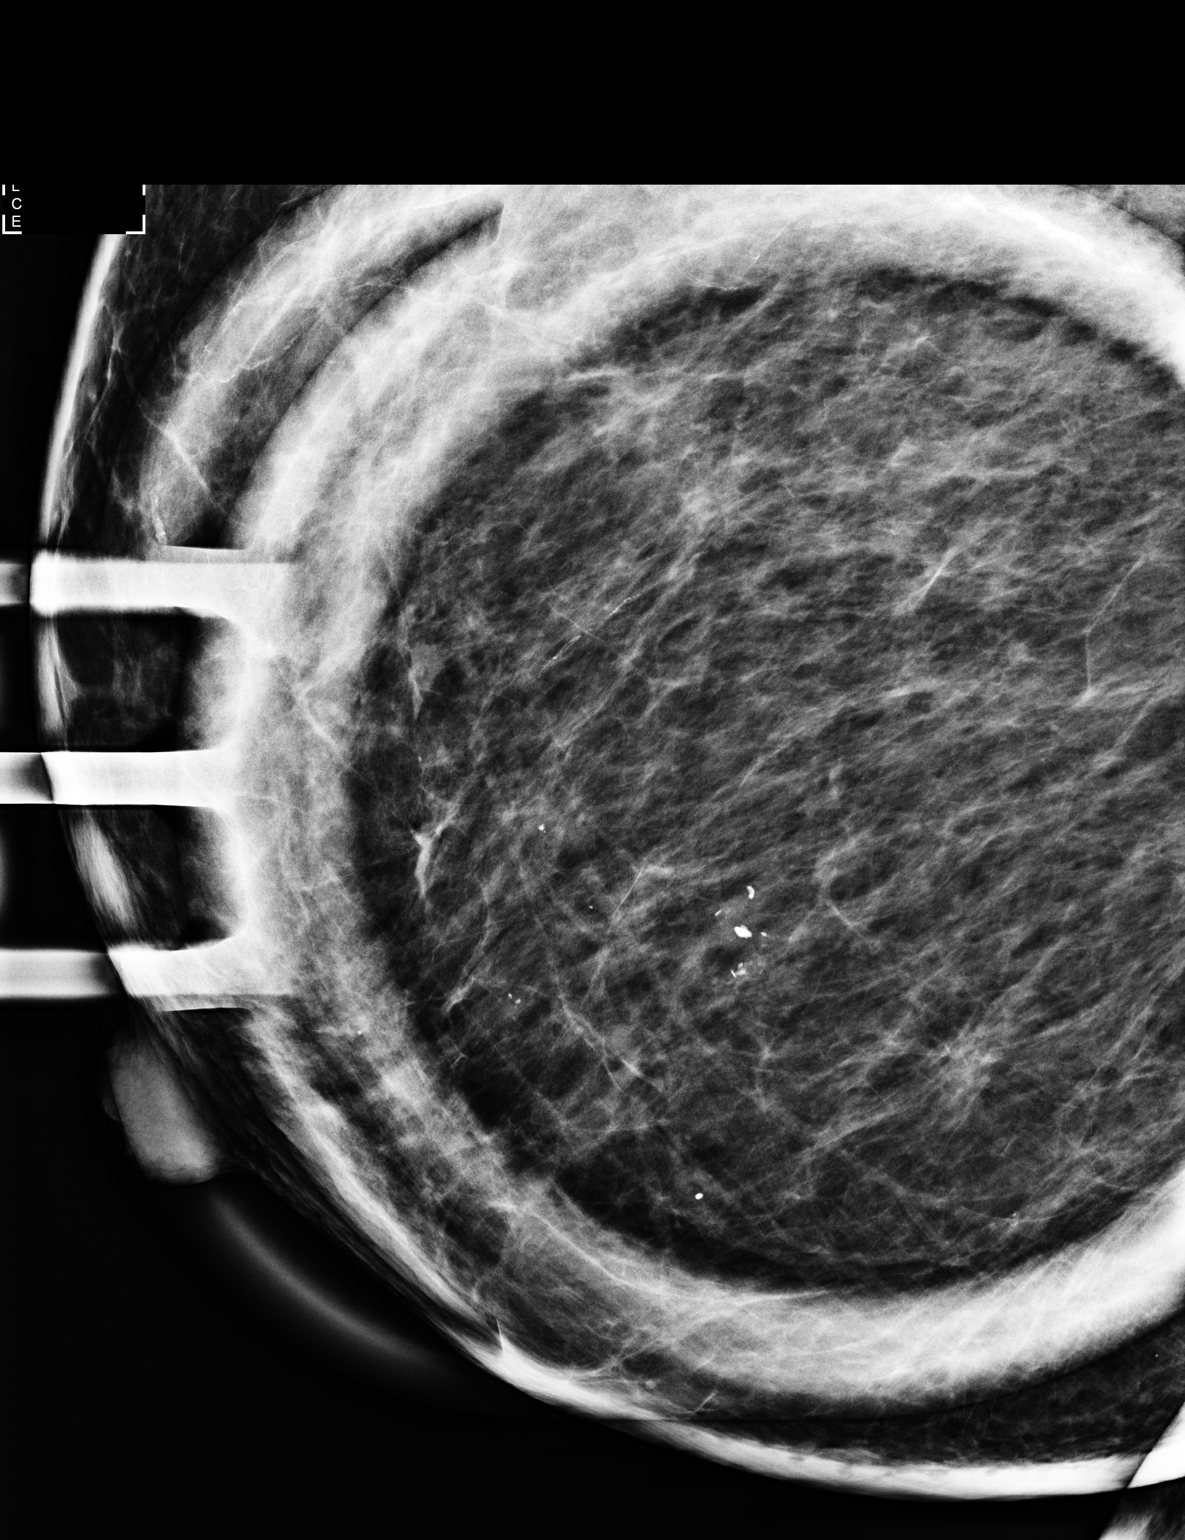

[R ML (2 of 2)]
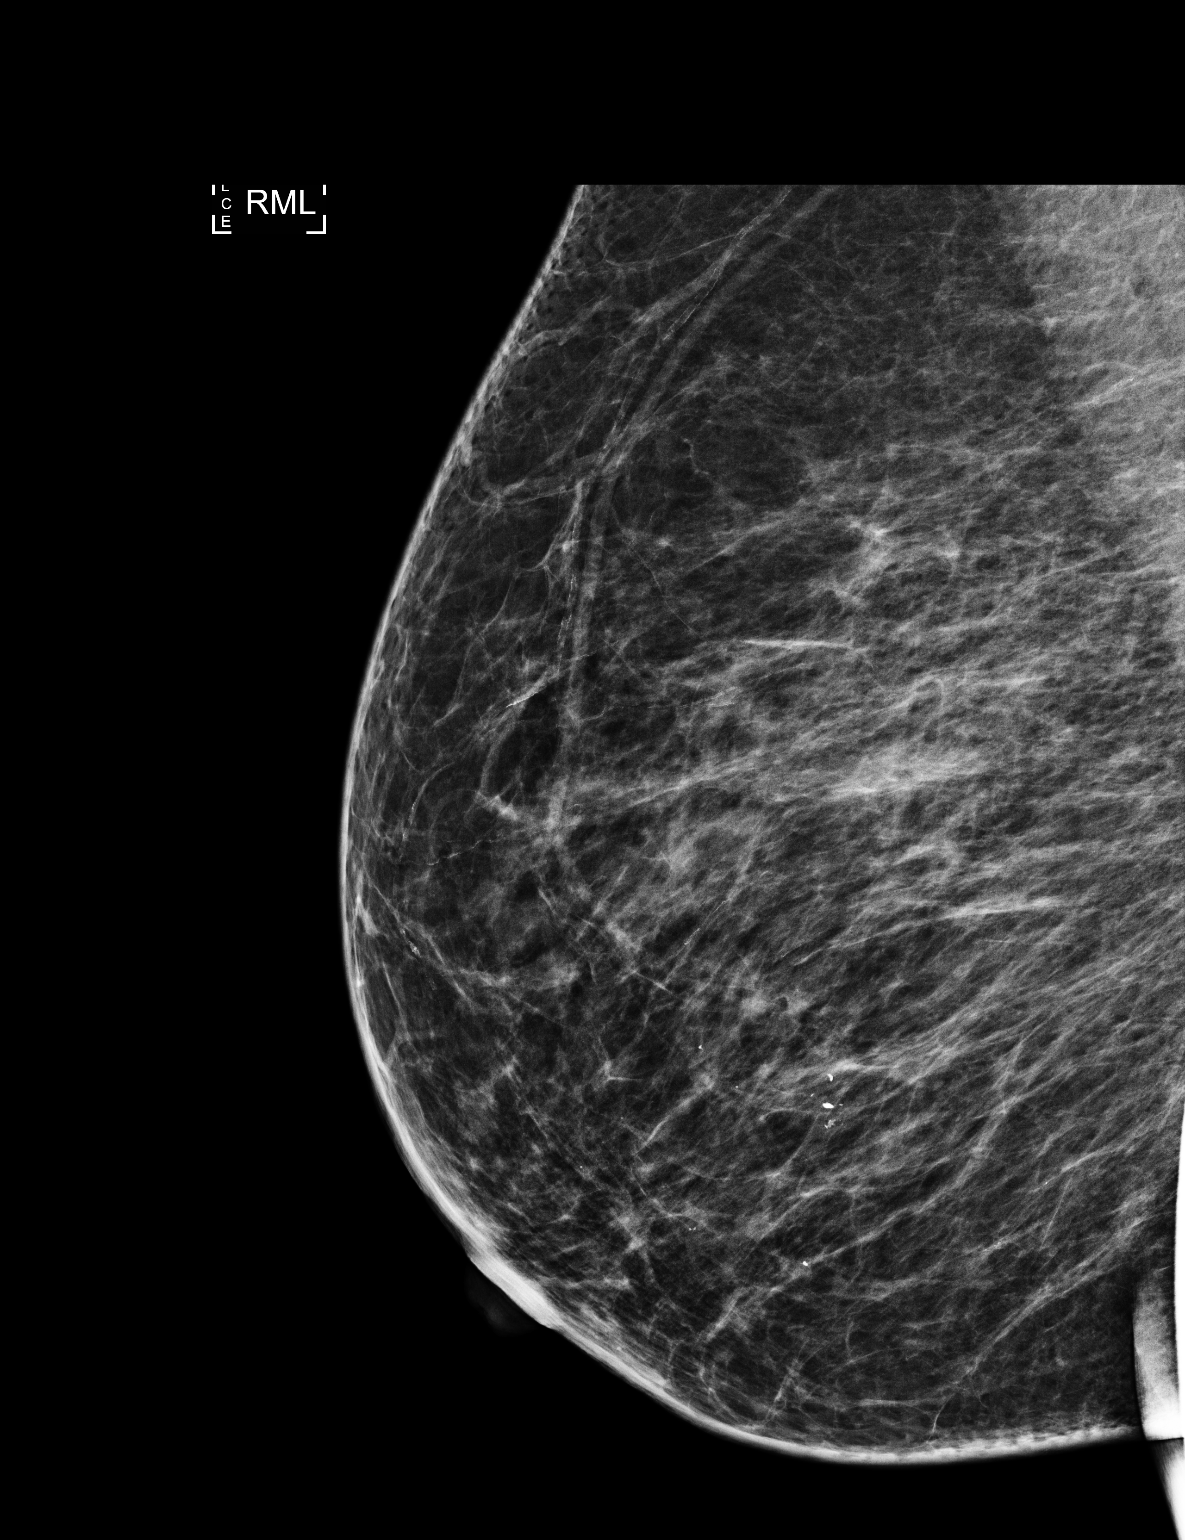

[3 of 3 positions shown; findings below may reference images not displayed]

ACR Breast Density Category b: There are scattered areas of
fibroglandular density.
FINDINGS: Spot 2D magnification and full field 2D view of the right breast was
performed. On the additional imaging there is persistence of grouped
coarse heterogeneous calcifications in the outer right breast
spanning 0.8 cm. There is no associated suspicious mass. No new
finding in the right breast.
IMPRESSION: Right breast calcifications laterally spanning 0.8 cm are
indeterminate.

RECOMMENDATION:
Stereotactic core needle biopsy of the right breast calcifications.

I have discussed the findings and recommendations with the patient.
The patient will be scheduled for biopsy prior to leaving the office
today.

BI-RADS CATEGORY  4: Suspicious.

## 2019-05-17 ENCOUNTER — Telehealth: Payer: Self-pay | Admitting: Cardiovascular Disease

## 2019-05-17 NOTE — Telephone Encounter (Signed)
Follow Up:   Returning a call from Friday, concerning her results.

## 2019-05-18 NOTE — Telephone Encounter (Signed)
Patient called- LVM regarding results.

## 2019-05-20 NOTE — Progress Notes (Signed)
NEUROLOGY FOLLOW UP OFFICE NOTE  Daveona Stadick [id number]  HISTORY OF PRESENT ILLNESS: Ann Weiss is a 43 year oldleft-handed woman with hypertension, hyperlipidemia, diabetes, and goiter whofollows up for stroke.  UPDATE: Current medications: ASA 81mg  daily; Crestor; Flexeril 10mg ; Norvasc; metformin; Micardis HCT   She has reported increased symptoms following the death of her stepdaughter and COVID vaccine in February.  She reported generalized weakness, palpitations and buzzing in her head.  She went to the ED for further evaluation on 04/01/2019.  Labs unremarkable.  She saw cardiology last month.  7 day Zio patch monitoring was negative for arrhythmia.  She was diagnosed with anxiety.    HISTORY: She was admitted to [facility] from 11/12/2017 to 11/13/2017 for stroke. She had a 2-week history of dragging the right foot. On arrival, blood pressure was 169/83. MRI of the brain was personally reviewed and demonstrated a subacute infarct in the left ACA territory. MRA of the brain revealed no significant large vessel stenosis, occlusion, or aneurysm. Echocardiogram demonstrated LVEF 55% - 65% but bubble study appeared not be performed.Hgb A1c was 8.3 and LDL was 85.At discharge, her aspirin was increased from 81 mg to 325 mg daily and Lipitor increased from 20 mg to 40 mg daily. Carotid doppler from 02/13/18 showed less than 50% stenosis in both internal carotid arteries.  Following discharge, she developed right leg pain and stiffness. An MRI of the lumbar spine from 11/23/2017 was personally reviewed and demonstrated severe L4-L5 facet arthrosis with severe spinal stenosis and severe right neural foraminal stenosis. When rolling over in bed, she experienced a popping or tearing sensation in her right groin or upper thigh. She presented to the ED at [facility] on 12/15/2017 for further evaluation. X-ray of right femur and pelvis was  unremarkable. She was diagnosed with muscle strain and discharged on Robaxin. She has been seen by neurosurgery who has chosen conservative management first before considering surgery.  She reported that pain had resolved and deferred surgery.  PAST MEDICAL HISTORY: Past Medical History:  Diagnosis Date  . Constipation   . Diabetes mellitus   . GERD (gastroesophageal reflux disease)   . Goiter   . Hyperlipidemia   . Hypertension   . Hypothyroidism   . Premature menopause age 49  . Stroke ([place])   . Vitamin D deficiency 2009    MEDICATIONS: Current Outpatient Medications on File Prior to Visit  Medication Sig Dispense Refill  . ALPRAZolam (XANAX) 0.5 MG tablet Take 0.5 mg by mouth 3 (three) times daily as needed for anxiety.     Marland Kitchen amLODipine (NORVASC) 10 MG tablet Take 1 tablet (10 mg total) by mouth daily. 30 tablet 5  . aspirin EC 81 MG tablet Take 81 mg by mouth daily.    Marland Kitchen BAYER CONTOUR TEST test strip TEST BLOOD SUGARS 3 TIMES DAILY 100 each 2  . cyclobenzaprine (FLEXERIL) 10 MG tablet Take 10 mg by mouth 3 (three) times daily as needed for muscle spasms.    . hydrALAZINE (APRESOLINE) 50 MG tablet Take 50 mg by mouth 2 (two) times daily.     Marland Kitchen levothyroxine (SYNTHROID, LEVOTHROID) 75 MCG tablet Take 75 mcg by mouth daily.  3  . LINZESS 145 MCG CAPS capsule Take 145 mcg by mouth daily.    . meloxicam (MOBIC) 15 MG tablet Take 15 mg by mouth daily as needed.    . metFORMIN (GLUCOPHAGE) 1000 MG tablet take 1 tablet by mouth twice a day with  food 60 tablet 5  . omeprazole (PRILOSEC) 20 MG capsule take 1 capsule by mouth once daily 30 capsule 11  . rosuvastatin (CRESTOR) 40 MG tablet Take 1 tablet (40 mg total) by mouth daily. 90 tablet 1  . telmisartan-hydrochlorothiazide (MICARDIS HCT) 80-25 MG tablet Take 1 tablet by mouth daily.  6  . vitamin C (ASCORBIC ACID) 250 MG tablet Take 250 mg by mouth daily.     No current facility-administered medications on file prior to visit.     ALLERGIES: Allergies  Allergen Reactions  . Ciprofloxacin     FAMILY HISTORY: Family History  Problem Relation Age of Onset  . Hypertension Mother   . Kidney disease Mother        renal failure, dialysis  . Heart disease Mother   . Hypertension Father   . Stroke Father   . Heart disease Father   . Arthritis Maternal Aunt   . Heart disease Maternal Aunt   . Diabetes Maternal Grandmother   . Cancer Neg Hx   . Breast cancer Neg Hx   . Colon cancer Neg Hx    SOCIAL HISTORY: Social History   Socioeconomic History  . Marital status: Married    Spouse name: Sonia Side  . Number of children: 2  . Years of education: Not on file  . Highest education level: Bachelor's degree (e.g., BA, AB, BS)  Occupational History  . Occupation: Retired from [employer]: RETIRED  Tobacco Use  . Smoking status: Never Smoker  . Smokeless tobacco: Never Used  Substance and Sexual Activity  . Alcohol use: Not Currently    Comment: socially maybe 1-2 times monthly  . Drug use: No  . Sexual activity: Not Currently  Other Topics Concern  . Not on file  Social History Narrative   Lives alone.  Daughter lives in [place] and other daughter lives in [place], [place]. 4 grandchildren      Patient is right-handed. She lives with her husband in a two level home. She drinks 2 cups of coffee a day, she does not exercise..   Social Determinants of Health   Financial Resource Strain:   . Difficulty of Paying Living Expenses:   Food Insecurity:   . Worried About Charity fundraiser in the Last Year:   . Arboriculturist in the Last Year:   Transportation Needs:   . Film/video editor (Medical):   Marland Kitchen Lack of Transportation (Non-Medical):   Physical Activity:   . Days of Exercise per Week:   . Minutes of Exercise per Session:   Stress:   . Feeling of Stress :   Social Connections:   . Frequency of Communication with Friends and Family:   . Frequency of Social Gatherings with Friends and Family:   .  Attends Religious Services:   . Active Member of Clubs or Organizations:   . Attends Archivist Meetings:   Marland Kitchen Marital Status:   Intimate Partner Violence:   . Fear of Current or Ex-Partner:   . Emotionally Abused:   Marland Kitchen Physically Abused:   . Sexually Abused:      PHYSICAL EXAM: Blood pressure (!) 160/94, pulse 68, height 5\' 6"  (1.676 m), weight 195 lb (88.5 kg), SpO2 100 %. General: No acute distress.  Patient appears well-groomed.   Head:  Normocephalic/atraumatic Eyes:  Fundi examined but not visualized Neck: supple, no paraspinal tenderness, full range of motion Heart:  Regular rate and rhythm Lungs:  Clear to  auscultation bilaterally Back: No paraspinal tenderness Neurological Exam: alert and oriented to person, place, and time. Attention span and concentration intact, recent and remote memory intact, fund of knowledge intact.  Speech fluent and not dysarthric, language intact.  CN II-XII intact. Bulk and tone normal, reduced finger thumb tapping speed and amplitude on right.  5-/5 right deltoid and hip flexion; otherwise muscle strength 5/5 throughout.  Sensation to pinprick and vibration intact.  Deep tendon reflexes 2+ throughout, toes downgoing.  Finger to nose and heel to shin testing intact.  Right hemiparetic gait.  Romberg with mild sway.  IMPRESSION: 1.  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  Hypertension 3.  Hyperlipidemia 4.  Type 2 diabetes mellitus 5.  Lumbar spinal stenosis 6.  Adjustment disorder with anxiety and depression  PLAN: Management as per PCP 1.  ASA 81mg  daily for secondary stroke prevention. 2.  Crestor (LDL goal less than 70) 3.  Optimize blood pressure control.  Follow up with PCP 4.  Optimize glycemic control (Hgb A1c goal less than 7) 5.  Management of depression/anxiety 6.  Follow up as needed.  Metta Clines, DO  CC: Seward Carol, MD

## 2019-05-24 ENCOUNTER — Ambulatory Visit (INDEPENDENT_AMBULATORY_CARE_PROVIDER_SITE_OTHER): Payer: No Typology Code available for payment source | Admitting: Neurology

## 2019-05-24 ENCOUNTER — Other Ambulatory Visit: Payer: Self-pay

## 2019-05-24 ENCOUNTER — Encounter: Payer: Self-pay | Admitting: Neurology

## 2019-05-24 VITALS — BP 160/94 | HR 68 | Ht 66.0 in | Wt 195.0 lb

## 2019-05-24 DIAGNOSIS — M48061 Spinal stenosis, lumbar region without neurogenic claudication: Secondary | ICD-10-CM

## 2019-05-24 DIAGNOSIS — R29898 Other symptoms and signs involving the musculoskeletal system: Secondary | ICD-10-CM

## 2019-05-24 DIAGNOSIS — I63422 Cerebral infarction due to embolism of left anterior cerebral artery: Secondary | ICD-10-CM

## 2019-05-24 DIAGNOSIS — F4323 Adjustment disorder with mixed anxiety and depressed mood: Secondary | ICD-10-CM

## 2019-05-24 DIAGNOSIS — I10 Essential (primary) hypertension: Secondary | ICD-10-CM

## 2019-05-24 DIAGNOSIS — E1169 Type 2 diabetes mellitus with other specified complication: Secondary | ICD-10-CM

## 2019-05-24 DIAGNOSIS — E785 Hyperlipidemia, unspecified: Secondary | ICD-10-CM

## 2019-05-24 NOTE — Patient Instructions (Signed)
1.  Continue aspirin 81mg  daily 2.  Continue Crestor 3.  Optimize blood pressure control.  Follow up with PCP 4.  Optimize blood sugar control 5.  Follow up as needed.

## 2019-05-26 ENCOUNTER — Ambulatory Visit
Admission: RE | Admit: 2019-05-26 | Discharge: 2019-05-26 | Disposition: A | Discharge status: Home / Self Care | Payer: No Typology Code available for payment source | Source: Ambulatory Visit | Attending: Internal Medicine | Admitting: Internal Medicine

## 2019-05-26 ENCOUNTER — Other Ambulatory Visit: Payer: Self-pay

## 2019-05-26 DIAGNOSIS — R921 Mammographic calcification found on diagnostic imaging of breast: Secondary | ICD-10-CM

## 2019-05-26 IMAGING — MG MM BREAST LOCALIZATION CLIP
4 series · 4 of 12 positions shown · non-contrast
Comparison: Previous exam(s).

CLINICAL DATA: Status post stereotactic guided core needle biopsy
right breast calcifications.

EXAM:
DIAGNOSTIC RIGHT MAMMOGRAM POST STEREOTACTIC BIOPSY

[R CC synth-2D]
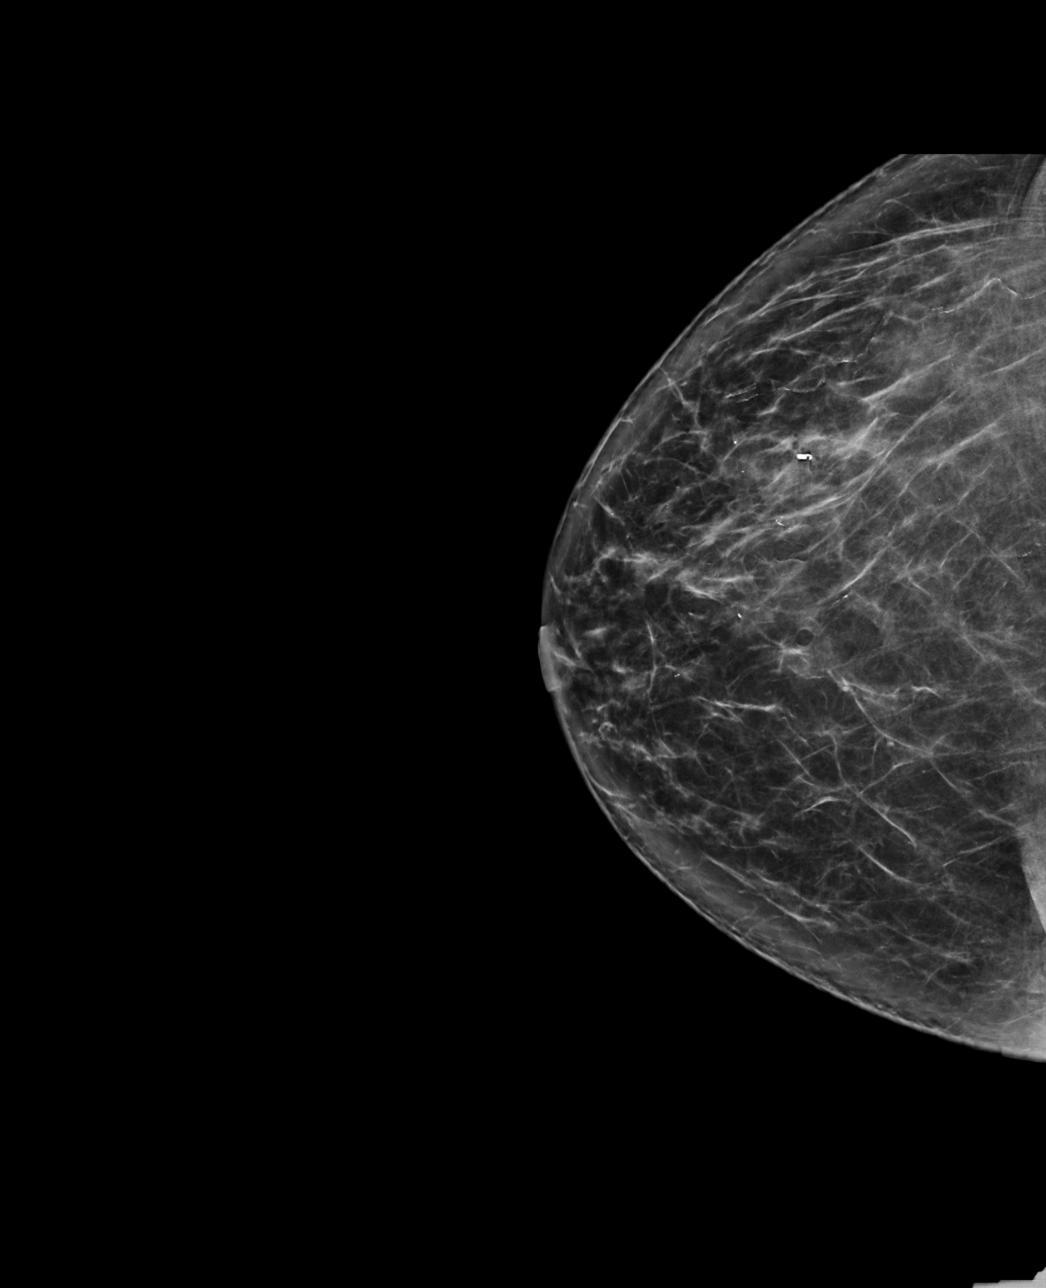

[R LM synth-2D]
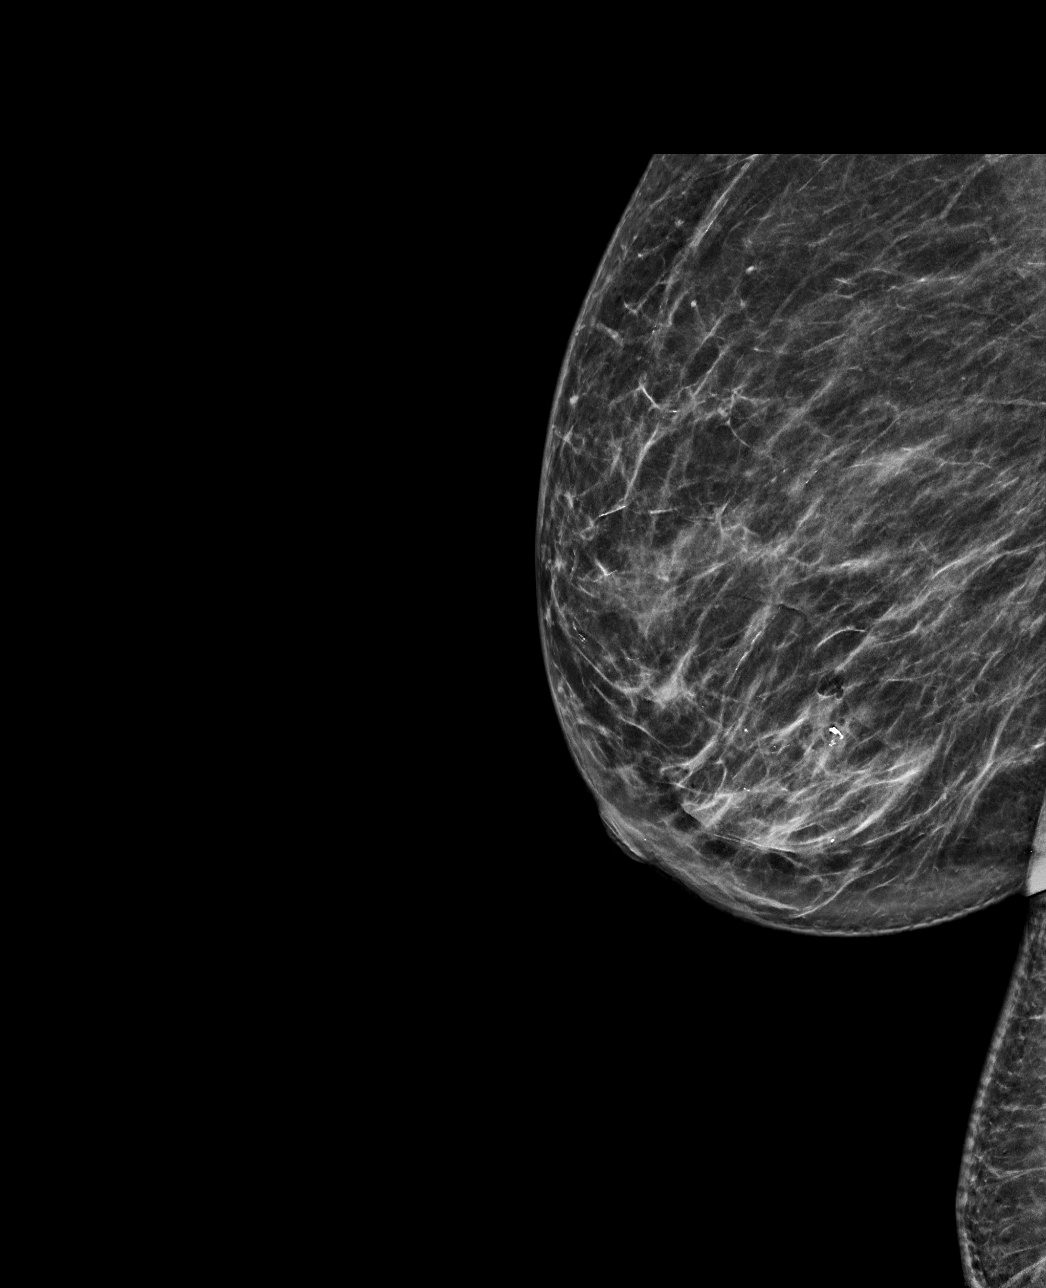

[R CC tomo · tomo slice 36/71.0]
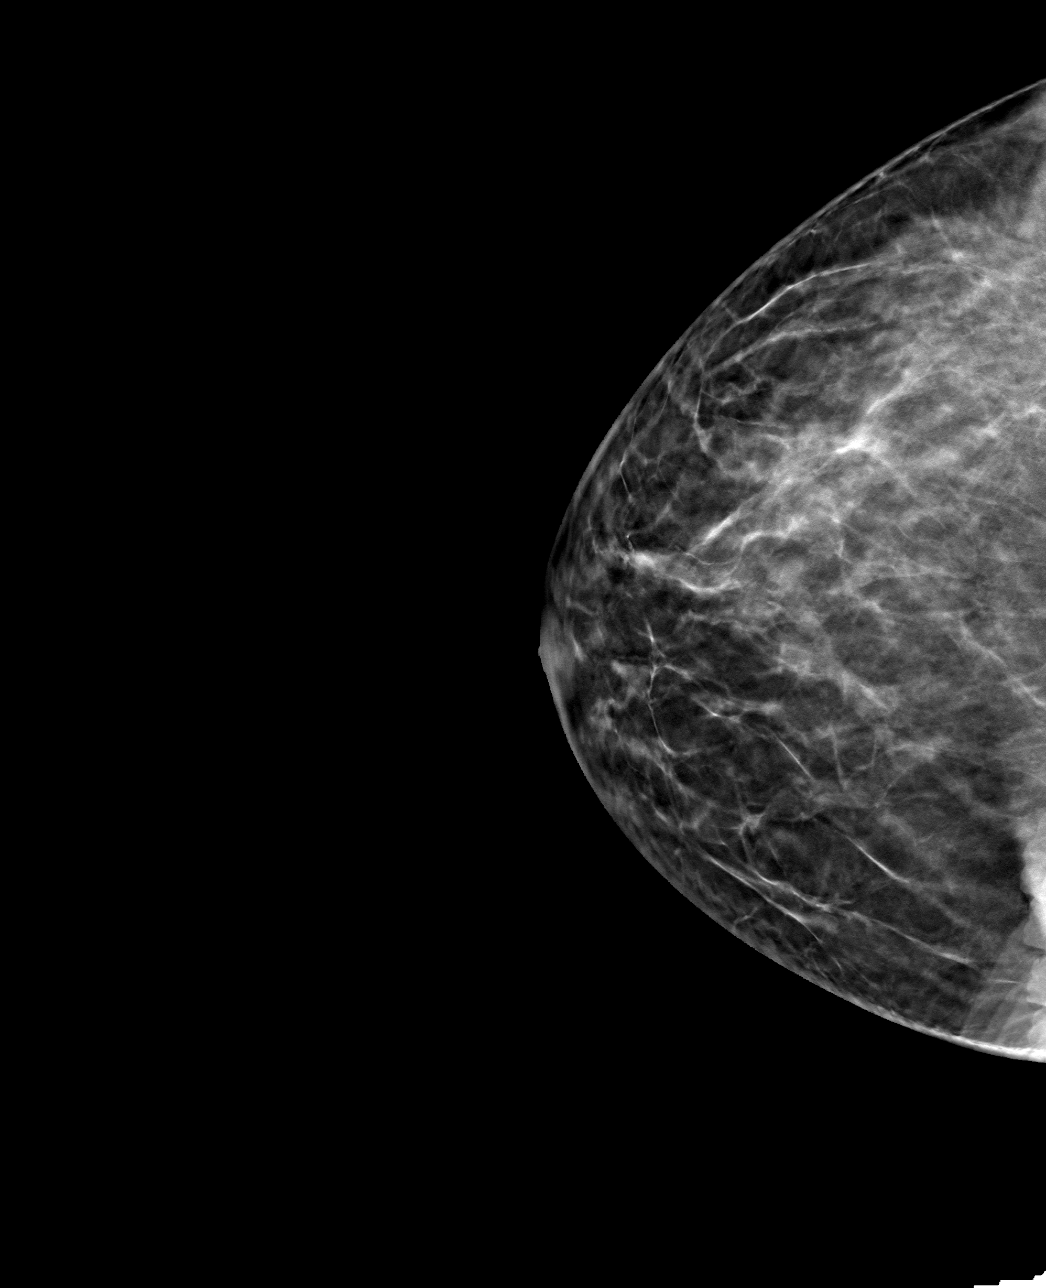

[R LM tomo · tomo slice 35/68.0]
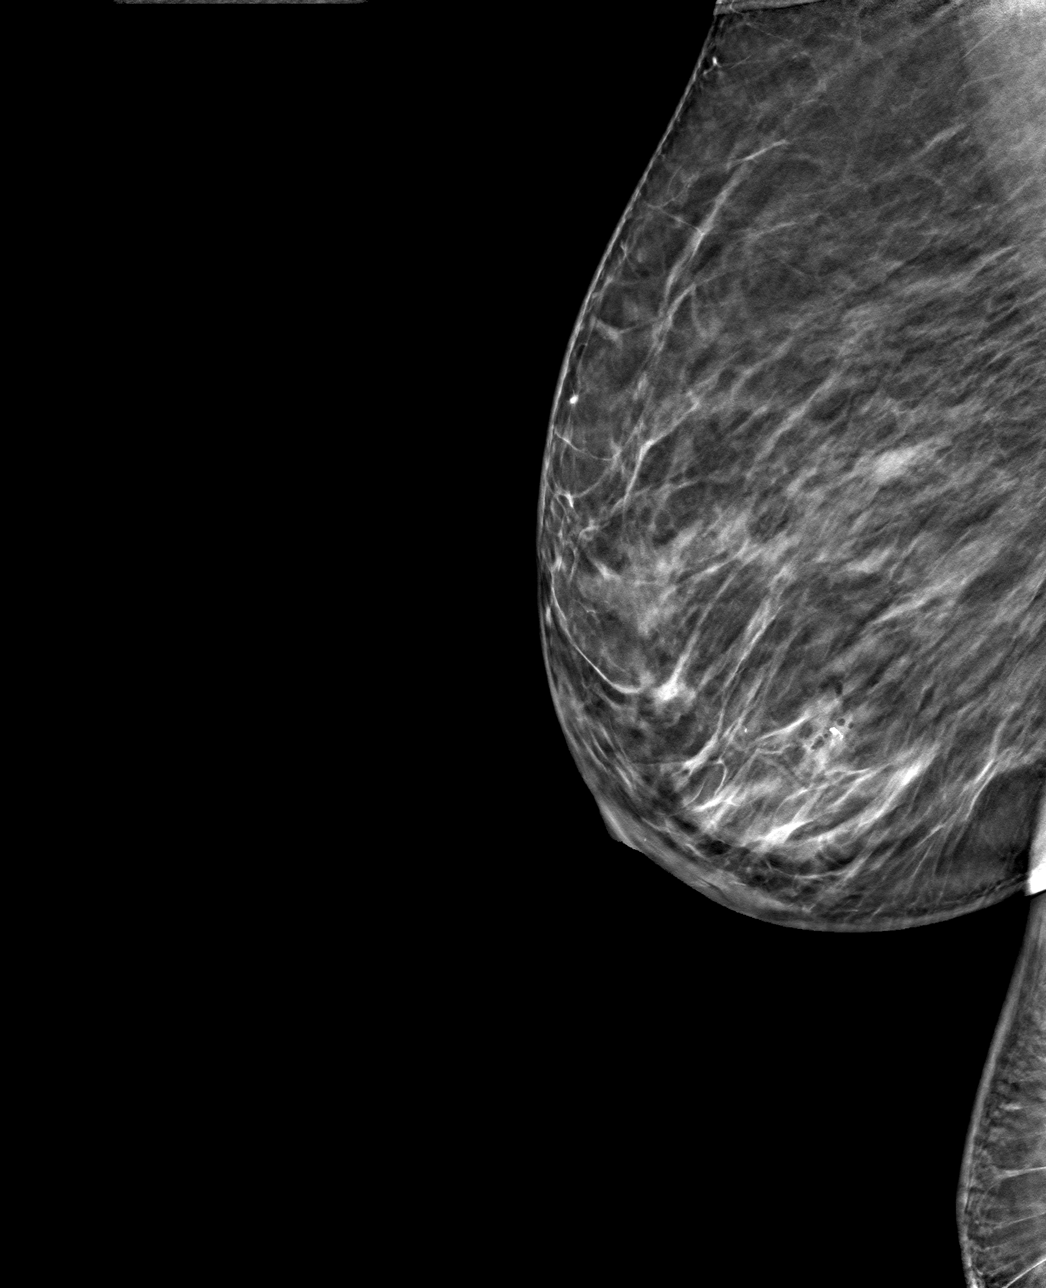

[4 of 12 positions shown; findings below may reference images not displayed]

FINDINGS: Mammographic images were obtained following stereotactic guided
biopsy of right breast calcifications. The biopsy marking clip is in
expected position at the site of biopsy.
IMPRESSION: Appropriate positioning of the coil shaped shaped biopsy marking
clip at the site of biopsy in the lower outer right breast.

Final Assessment: Post Procedure Mammograms for Marker Placement

## 2019-05-26 IMAGING — MG MM BREAST BX W/ LOC DEV 1ST LESION IMAGE BX SPEC STEREO GUIDE*R*
8 of 9 series · 8 of 17 positions shown · non-contrast
Comparison: Previous exams.
COMPARISON: Previous exams.

Addendum:
CLINICAL DATA: Patient with indeterminate right breast
calcifications.

EXAM:
RIGHT BREAST STEREOTACTIC CORE NEEDLE BIOPSY

[R (1 of 6)]
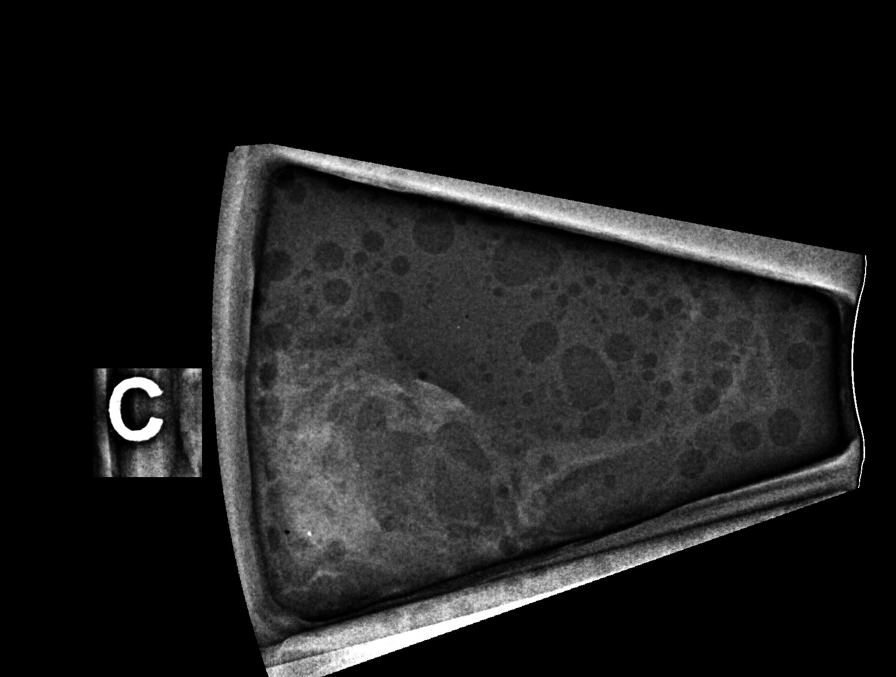

[R (2 of 6)]
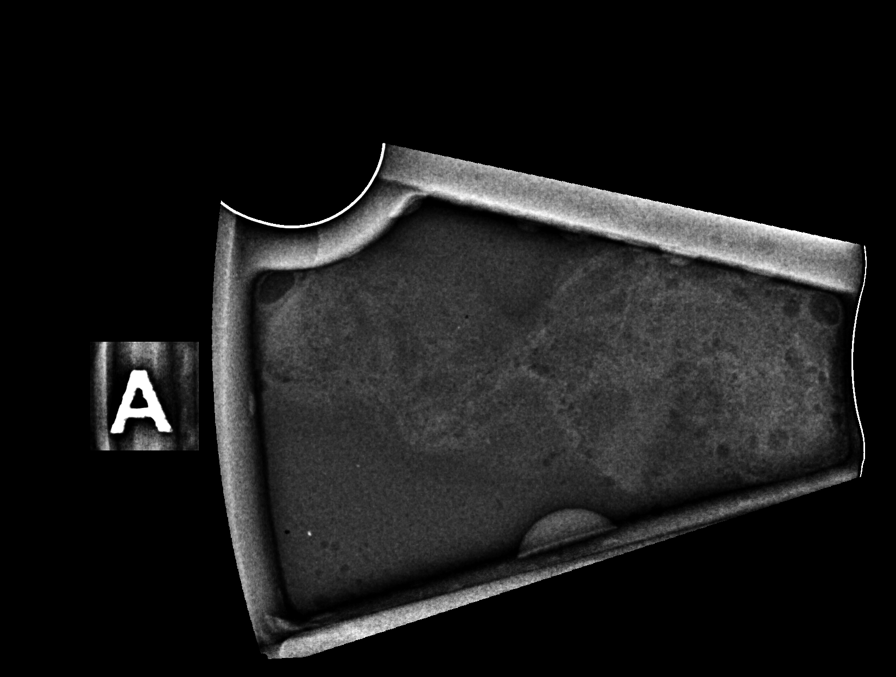

[R (3 of 6)]
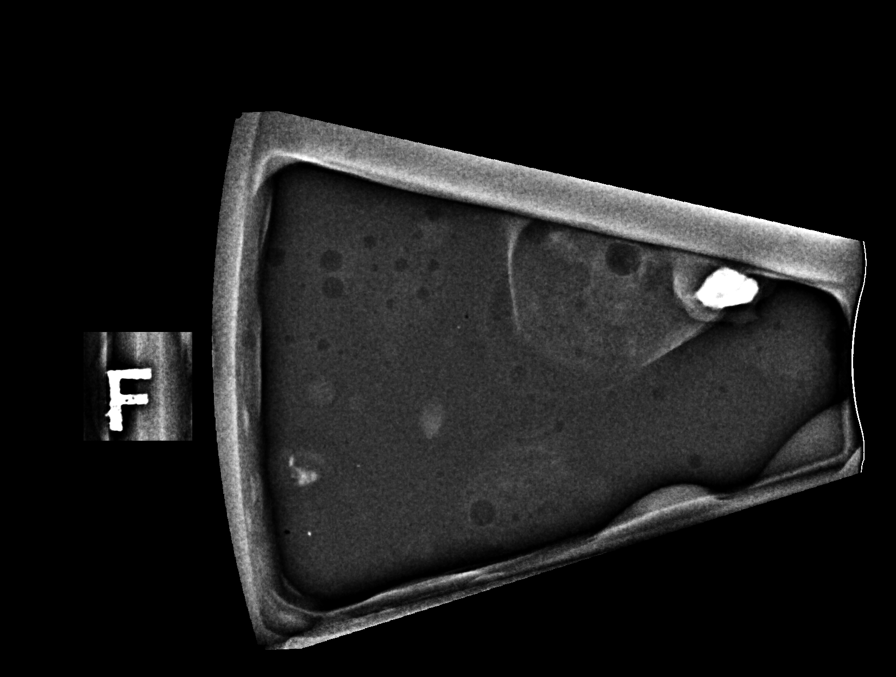

[R (4 of 6)]
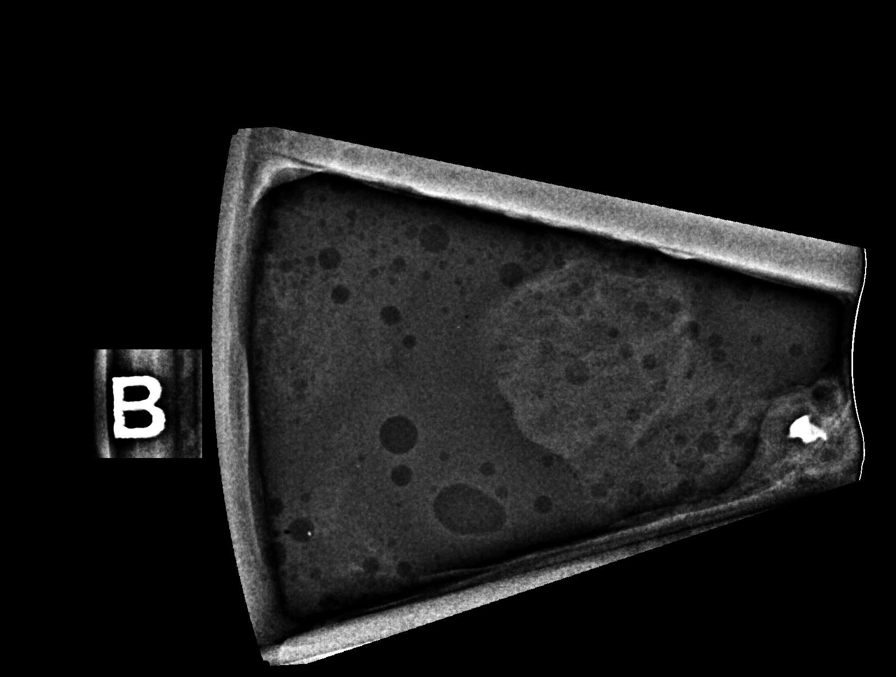

[R (5 of 6)]
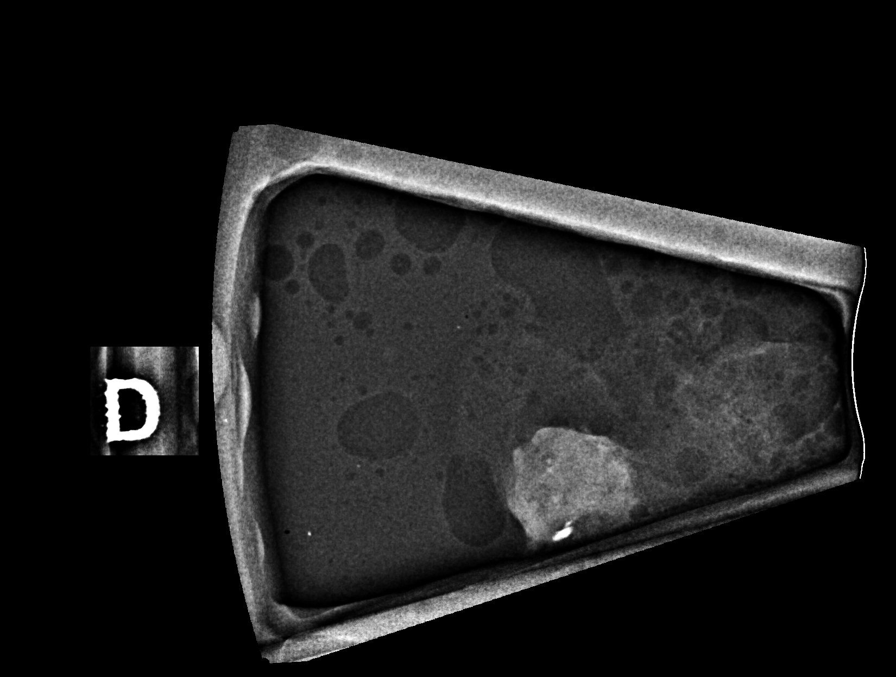

[R (6 of 6)]
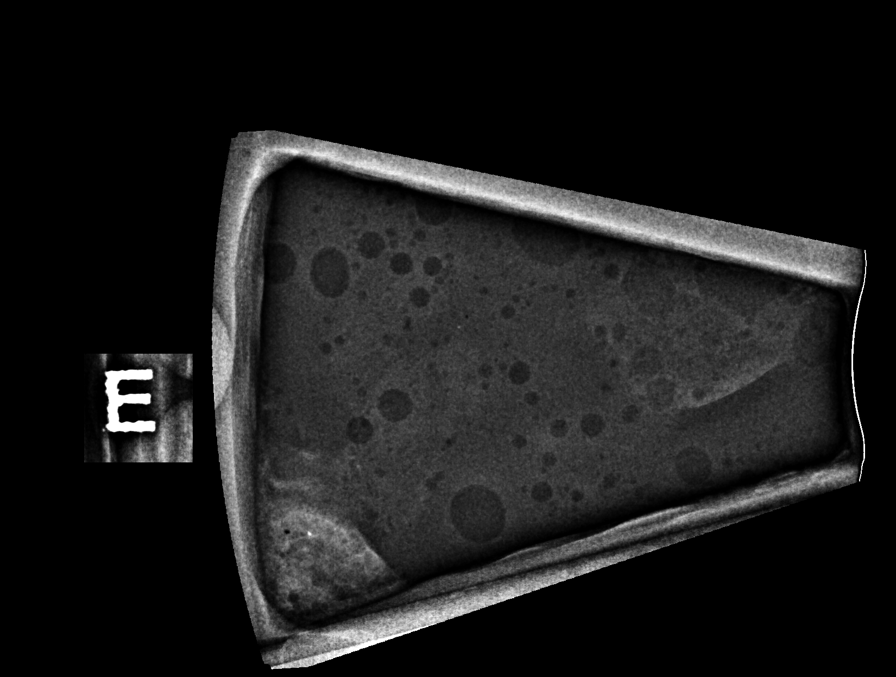

[R LM]
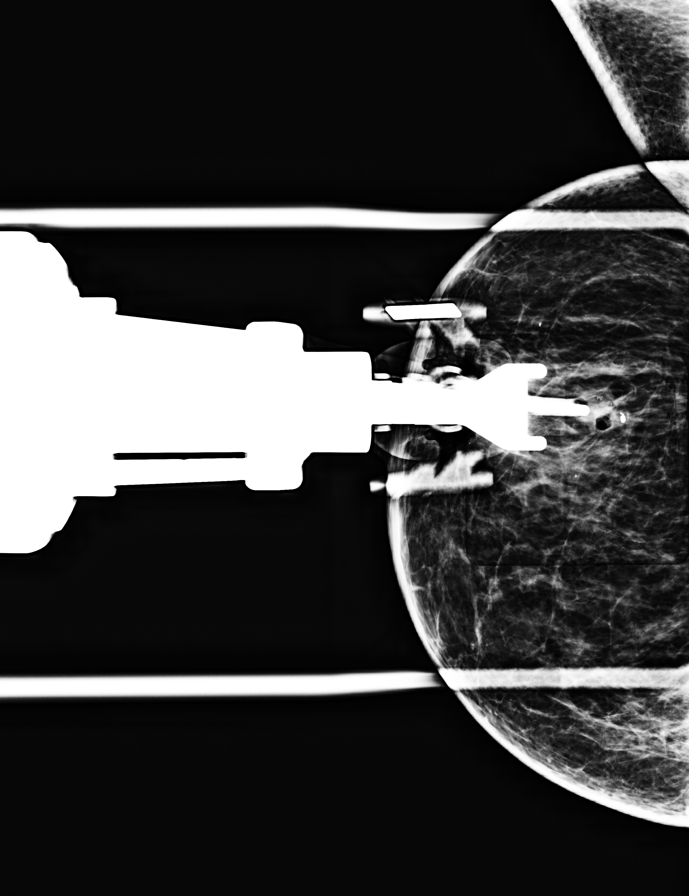

[R LM tomo · tomo slice 25/50.0]
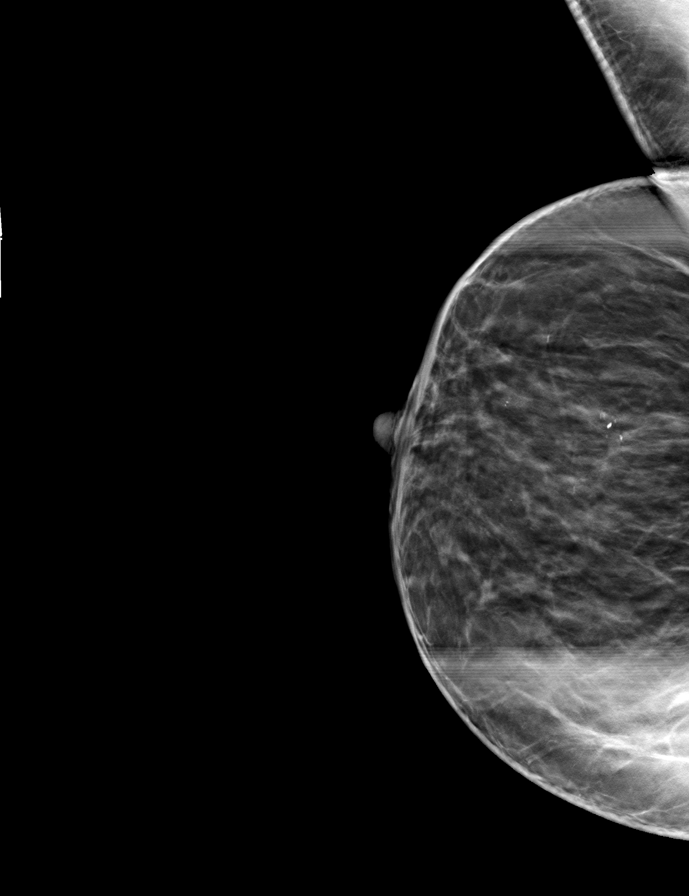

[8 of 17 positions shown; findings below may reference images not displayed]



Using sterile technique and 1% Lidocaine as local anesthetic, under
stereotactic guidance, a 9 gauge vacuum assisted device was used to
perform core needle biopsy of calcifications within the lower outer
right breast middle depth using a lateral approach. Specimen
radiograph was performed showing calcifications. Specimens with
calcifications are identified for pathology.

Lesion quadrant: Lower outer quadrant

At the conclusion of the procedure, coil shaped tissue marker clip
was deployed into the biopsy cavity. Follow-up 2-view mammogram was
performed and dictated separately.
IMPRESSION: Stereotactic-guided biopsy of right breast calcifications. No
apparent complications.

ADDENDUM:
Pathology revealed HYALINIZED INTRADUCTAL PAPILLOMA WITH
CALCIFICATIONS, ATYPICAL DUCTAL HYPERPLASIA of the RIGHT breast,
lower outer. This was found to be concordant by Dr. Jesi Radilla, with
excision recommended.

Pathology results were discussed with the patient by telephone. The
patient reported doing well after the biopsy with tenderness at the
site. Post biopsy instructions and care were reviewed and questions
were answered. The patient was encouraged to call The [REDACTED]

Surgical consultation has been arranged with Dr. Jhovi Anthony Sanlucas at
[REDACTED] on June 16, 2019.

Pathology results reported by Zeinab Tiger RN on 05/27/2019.



Using sterile technique and 1% Lidocaine as local anesthetic, under
stereotactic guidance, a 9 gauge vacuum assisted device was used to
perform core needle biopsy of calcifications within the lower outer
right breast middle depth using a lateral approach. Specimen
radiograph was performed showing calcifications. Specimens with
calcifications are identified for pathology.

Lesion quadrant: Lower outer quadrant

At the conclusion of the procedure, coil shaped tissue marker clip
was deployed into the biopsy cavity. Follow-up 2-view mammogram was
performed and dictated separately.
IMPRESSION: Stereotactic-guided biopsy of right breast calcifications. No
apparent complications.

## 2019-05-27 HISTORY — PX: BREAST BIOPSY: SHX20

## 2019-06-22 ENCOUNTER — Encounter: Payer: Self-pay | Admitting: Neurology

## 2019-07-06 ENCOUNTER — Ambulatory Visit: Payer: Self-pay | Admitting: General Surgery

## 2019-07-06 DIAGNOSIS — N6091 Unspecified benign mammary dysplasia of right breast: Secondary | ICD-10-CM

## 2019-07-14 ENCOUNTER — Other Ambulatory Visit: Payer: Self-pay | Admitting: General Surgery

## 2019-07-14 DIAGNOSIS — N6091 Unspecified benign mammary dysplasia of right breast: Secondary | ICD-10-CM

## 2019-08-02 LAB — LIPID PANEL
Chol/HDL Ratio: 3.8 ratio (ref 0.0–4.4)
Cholesterol, Total: 253 mg/dL — ABNORMAL HIGH (ref 100–199)
HDL: 66 mg/dL (ref >39)
LDL Chol Calc (NIH): 168 mg/dL — ABNORMAL HIGH (ref 0–99)
Triglycerides: 107 mg/dL (ref 0–149)
VLDL Cholesterol Cal: 19 mg/dL (ref 5–40)

## 2019-08-03 NOTE — Progress Notes (Signed)
Cardiology Office Note:   Date:  08/04/2019  NAME:  Ann Weiss    MRN: [id number] DOB:  28-Jun-1950   PCP:  Seward Carol, MD  Cardiologist:  No primary care provider on file.   Referring MD: Seward Carol, MD   Chief Complaint  Patient presents with  . Hyperlipidemia    History of Present Illness:   Ann Weiss is a 69 y.o. female with a hx of CVA, HTN, DM, HLD who presents for follow-up. Complained of palpitations. TSH normal and monitor normal. Elevated LDL and we increased crestor to 40 mg QD. Still not at goal.  She reports she stopped taking her Crestor about 1 month ago.  Apparently she was diagnosed with breast calcifications and she will undergo biopsy.  She was concerned that the Crestor medication may increase calcium in her body.  Her palpitations have stopped.  She reports that the stress in her life has gone away and this was likely the result of her symptoms.  She is had no further episodes of palpitations.  She is still exercising up to 20 minutes/day on an exercise bike.  She has no chest pain or shortness of breath.  She has no pain in her legs concerning for claudication.  She does get pins and needle pain in her leg at times.  However this is now with exercise.  Blood pressure 138/80.  She seems to be doing overall well.  I would have like to see her cholesterol levels on the statin medication but we will have to follow this up in a few months.  Problem List 1. CVA 2019 2. DM -A1c 7.7 3. HTN 4. HLD -T chol 253, HDL 66, LDL 168, TG 107  Past Medical History: Past Medical History:  Diagnosis Date  . Constipation   . Diabetes mellitus   . GERD (gastroesophageal reflux disease)   . Goiter   . Hyperlipidemia   . Hypertension   . Hypothyroidism   . Premature menopause age 18  . Stroke ([place])   . Vitamin D deficiency 2009    Past Surgical History: Past Surgical History:  Procedure Laterality Date  . CESAREAN SECTION     x 2  . COLONOSCOPY   [id number]  . ESOPHAGOGASTRODUODENOSCOPY  [id number]  . FOOT SURGERY Right 01/2013   bone spur  . TUBAL LIGATION      Current Medications: Current Meds  Medication Sig  . amLODipine (NORVASC) 10 MG tablet Take 1 tablet (10 mg total) by mouth daily.  Marland Kitchen aspirin EC 81 MG tablet Take 81 mg by mouth daily.  Marland Kitchen BAYER CONTOUR TEST test strip TEST BLOOD SUGARS 3 TIMES DAILY  . cyclobenzaprine (FLEXERIL) 10 MG tablet Take 10 mg by mouth 3 (three) times daily as needed for muscle spasms.  . hydrALAZINE (APRESOLINE) 50 MG tablet Take 50 mg by mouth 2 (two) times daily.   Marland Kitchen lactulose (CHRONULAC) 10 GM/15ML solution Take 10 g by mouth 2 (two) times daily.  Marland Kitchen levothyroxine (SYNTHROID, LEVOTHROID) 75 MCG tablet Take 75 mcg by mouth daily.  . meloxicam (MOBIC) 15 MG tablet Take 15 mg by mouth daily as needed.  . metFORMIN (GLUCOPHAGE) 1000 MG tablet take 1 tablet by mouth twice a day with food  . omeprazole (PRILOSEC) 20 MG capsule take 1 capsule by mouth once daily (Patient taking differently: take 1 capsule by mouth as needed)  . rosuvastatin (CRESTOR) 40 MG tablet Take 1 tablet (40 mg total) by mouth daily.  Marland Kitchen telmisartan-hydrochlorothiazide (  MICARDIS HCT) 80-25 MG tablet Take 1 tablet by mouth daily.  . vitamin C (ASCORBIC ACID) 250 MG tablet Take 250 mg by mouth daily.     Allergies:    Ciprofloxacin   Social History: Social History   Socioeconomic History  . Marital status: Married    Spouse name: Sonia Side  . Number of children: 2  . Years of education: Not on file  . Highest education level: Bachelor's degree (e.g., BA, AB, BS)  Occupational History  . Occupation: Retired from [employer]: RETIRED  Tobacco Use  . Smoking status: Never Smoker  . Smokeless tobacco: Never Used  Vaping Use  . Vaping Use: Never used  Substance and Sexual Activity  . Alcohol use: Not Currently    Comment: socially maybe 1-2 times monthly  . Drug use: No  . Sexual activity: Not Currently  Other Topics  Concern  . Not on file  Social History Narrative   Lives alone.  Daughter lives in [place] and other daughter lives in [place], [place]. 4 grandchildren      Patient is right-handed. She lives with her husband in a two level home. She drinks 2 cups of coffee a day, she does not exercise..   Social Determinants of Health   Financial Resource Strain:   . Difficulty of Paying Living Expenses:   Food Insecurity:   . Worried About Charity fundraiser in the Last Year:   . Arboriculturist in the Last Year:   Transportation Needs:   . Film/video editor (Medical):   Marland Kitchen Lack of Transportation (Non-Medical):   Physical Activity:   . Days of Exercise per Week:   . Minutes of Exercise per Session:   Stress:   . Feeling of Stress :   Social Connections:   . Frequency of Communication with Friends and Family:   . Frequency of Social Gatherings with Friends and Family:   . Attends Religious Services:   . Active Member of Clubs or Organizations:   . Attends Archivist Meetings:   Marland Kitchen Marital Status:      Family History: he patient's family history includes Arthritis in her maternal aunt; Diabetes in her maternal grandmother; Heart disease in her father, maternal aunt, and mother; Hypertension in her father and mother; Kidney disease in her mother; Stroke in her father. There is no history of Cancer, Breast cancer, or Colon cancer.  ROS:   All other ROS reviewed and negative. Pertinent positives noted in the HPI.     EKGs/Labs/Other Studies Reviewed:   The following studies were personally reviewed by me today:   TTE 11/2017  - Left ventricle: The cavity size was normal. Wall thickness was  increased in a pattern of mild LVH. Systolic function was normal.  The estimated ejection fraction was in the range of 55% to 65%.   Zio 05/14/2019 -> normal without arrhythmia   Recent Labs: 04/01/2019: ALT 12; BUN 14; Creatinine, Ser 0.89; Hemoglobin 13.4; Platelets 198; Potassium 3.6; Sodium  141 04/30/2019: TSH 4.500   Recent Lipid Panel    Component Value Date/Time   CHOL 253 (H) 08/02/2019 1115   TRIG 107 08/02/2019 1115   HDL 66 08/02/2019 1115   CHOLHDL 3.8 08/02/2019 1115   CHOLHDL 3.0 11/13/2017 0312   VLDL 17 11/13/2017 0312   LDLCALC 168 (H) 08/02/2019 1115    Physical Exam:   VS:  BP (!) 138/80   Pulse 68   Ht 5\' 6"  (  1.676 m)   Wt 192 lb 6.4 oz (87.3 kg)   BMI 31.05 kg/m    Wt Readings from Last 3 Encounters:  08/04/19 192 lb 6.4 oz (87.3 kg)  05/24/19 195 lb (88.5 kg)  04/15/19 196 lb 12.8 oz (89.3 kg)    General: Well nourished, well developed, in no acute distress Heart: Atraumatic, normal size  Eyes: PEERLA, EOMI  Neck: Supple, no JVD Endocrine: No thryomegaly Cardiac: Normal S1, S2; RRR; no murmurs, rubs, or gallops Lungs: Clear to auscultation bilaterally, no wheezing, rhonchi or rales  Abd: Soft, nontender, no hepatomegaly  Ext: No edema, pulses 2+ Musculoskeletal: No deformities, BUE and BLE strength normal and equal Skin: Warm and dry, no rashes   Neuro: Alert and oriented to person, place, time, and situation, CNII-XII grossly intact, no focal deficits  Psych: Normal mood and affect   ASSESSMENT:   Sarahann Horrell is a 69 y.o. female who presents for the following: 1. Palpitations   2. Essential hypertension   3. Mixed hyperlipidemia     PLAN:   1. Palpitations -Palpitations have resolved.  Zio patch without any significant arrhythmias.  Stress related.  2. Essential hypertension -Blood pressure acceptable today.  No change in medications.  3. Mixed hyperlipidemia -LDL cholesterol not at goal.  Apparently she stopped taking her Crestor.  She will start taking this again.  She will have a fresh panel of labs done by her primary care physician in October.  I will see her back in 6 months to discuss if any changes need to be done.  Would like her LDL cholesterol less than 70 as she has had a stroke.  Disposition: Return in about  6 months (around 02/04/2020).  Medication Adjustments/Labs and Tests Ordered: Current medicines are reviewed at length with the patient today.  Concerns regarding medicines are outlined above.  No orders of the defined types were placed in this encounter.  No orders of the defined types were placed in this encounter.   Patient Instructions  Medication Instructions:  The current medical regimen is effective;  continue present plan and medications.  *If you need a refill on your cardiac medications before your next appointment, please call your pharmacy*   Follow-Up: At [facility], you and your health needs are our priority.  As part of our continuing mission to provide you with exceptional heart care, we have created designated Provider Care Teams.  These Care Teams include your primary Cardiologist (physician) and Advanced Practice Providers (APPs -  Physician Assistants and Nurse Practitioners) who all work together to provide you with the care you need, when you need it.  We recommend signing up for the patient portal called "MyChart".  Sign up information is provided on this After Visit Summary.  MyChart is used to connect with patients for Virtual Visits (Telemedicine).  Patients are able to view lab/test results, encounter notes, upcoming appointments, etc.  Non-urgent messages can be sent to your provider as well.   To learn more about what you can do with MyChart, go to [email or web address].    Your next appointment:   6 month(s)  The format for your next appointment:   In Person  Provider:   Eleonore Chiquito, MD       Time Spent with Patient: I have spent a total of 25 minutes with patient reviewing hospital notes, telemetry, EKGs, labs and examining the patient as well as establishing an assessment and plan that was discussed with the patient.  > 50%  of time was spent in direct patient care.  Signed, Addison Naegeli. Audie Box, [place]  [address], [place], [place] [address] [phone]  08/04/2019 10:48 AM

## 2019-08-04 ENCOUNTER — Other Ambulatory Visit: Payer: Self-pay

## 2019-08-04 ENCOUNTER — Ambulatory Visit (INDEPENDENT_AMBULATORY_CARE_PROVIDER_SITE_OTHER): Payer: No Typology Code available for payment source | Admitting: Cardiovascular Disease

## 2019-08-04 ENCOUNTER — Encounter: Payer: Self-pay | Admitting: Cardiovascular Disease

## 2019-08-04 VITALS — BP 138/80 | HR 68 | Ht 66.0 in | Wt 192.4 lb

## 2019-08-04 DIAGNOSIS — R002 Palpitations: Secondary | ICD-10-CM | POA: Diagnosis not present

## 2019-08-04 DIAGNOSIS — E782 Mixed hyperlipidemia: Secondary | ICD-10-CM | POA: Diagnosis not present

## 2019-08-04 DIAGNOSIS — I10 Essential (primary) hypertension: Secondary | ICD-10-CM | POA: Diagnosis not present

## 2019-08-04 NOTE — Patient Instructions (Signed)
Medication Instructions:  The current medical regimen is effective;  continue present plan and medications.  *If you need a refill on your cardiac medications before your next appointment, please call your pharmacy*   Follow-Up: At [facility], you and your health needs are our priority.  As part of our continuing mission to provide you with exceptional heart care, we have created designated Provider Care Teams.  These Care Teams include your primary Cardiologist (physician) and Advanced Practice Providers (APPs -  Physician Assistants and Nurse Practitioners) who all work together to provide you with the care you need, when you need it.  We recommend signing up for the patient portal called "MyChart".  Sign up information is provided on this After Visit Summary.  MyChart is used to connect with patients for Virtual Visits (Telemedicine).  Patients are able to view lab/test results, encounter notes, upcoming appointments, etc.  Non-urgent messages can be sent to your provider as well.   To learn more about what you can do with MyChart, go to [email or web address].    Your next appointment:   6 month(s)  The format for your next appointment:   In Person  Provider:   St. Francois O'Neal, MD      

## 2019-08-23 ENCOUNTER — Other Ambulatory Visit: Payer: Self-pay

## 2019-08-23 ENCOUNTER — Encounter (HOSPITAL_BASED_OUTPATIENT_CLINIC_OR_DEPARTMENT_OTHER): Payer: Self-pay | Admitting: General Surgery

## 2019-08-24 ENCOUNTER — Encounter (HOSPITAL_BASED_OUTPATIENT_CLINIC_OR_DEPARTMENT_OTHER)
Admission: RE | Admit: 2019-08-24 | Discharge: 2019-08-24 | Disposition: A | Discharge status: Home / Self Care | Payer: No Typology Code available for payment source | Source: Ambulatory Visit | Attending: General Surgery | Admitting: General Surgery

## 2019-08-24 ENCOUNTER — Other Ambulatory Visit (HOSPITAL_COMMUNITY)
Admission: RE | Admit: 2019-08-24 | Discharge: 2019-08-24 | Disposition: A | Discharge status: Home / Self Care | Payer: No Typology Code available for payment source | Source: Ambulatory Visit | Attending: General Surgery | Admitting: General Surgery

## 2019-08-24 DIAGNOSIS — Z01812 Encounter for preprocedural laboratory examination: Secondary | ICD-10-CM | POA: Diagnosis present

## 2019-08-24 DIAGNOSIS — K219 Gastro-esophageal reflux disease without esophagitis: Secondary | ICD-10-CM | POA: Diagnosis not present

## 2019-08-24 DIAGNOSIS — D241 Benign neoplasm of right breast: Secondary | ICD-10-CM | POA: Diagnosis not present

## 2019-08-24 DIAGNOSIS — E039 Hypothyroidism, unspecified: Secondary | ICD-10-CM | POA: Diagnosis not present

## 2019-08-24 DIAGNOSIS — I69351 Hemiplegia and hemiparesis following cerebral infarction affecting right dominant side: Secondary | ICD-10-CM | POA: Diagnosis not present

## 2019-08-24 DIAGNOSIS — E119 Type 2 diabetes mellitus without complications: Secondary | ICD-10-CM | POA: Diagnosis not present

## 2019-08-24 DIAGNOSIS — Z20822 Contact with and (suspected) exposure to covid-19: Secondary | ICD-10-CM | POA: Diagnosis not present

## 2019-08-24 DIAGNOSIS — I10 Essential (primary) hypertension: Secondary | ICD-10-CM | POA: Diagnosis not present

## 2019-08-24 DIAGNOSIS — F419 Anxiety disorder, unspecified: Secondary | ICD-10-CM | POA: Diagnosis not present

## 2019-08-24 DIAGNOSIS — N6091 Unspecified benign mammary dysplasia of right breast: Secondary | ICD-10-CM | POA: Diagnosis present

## 2019-08-24 DIAGNOSIS — Z79899 Other long term (current) drug therapy: Secondary | ICD-10-CM | POA: Diagnosis not present

## 2019-08-24 DIAGNOSIS — Z7982 Long term (current) use of aspirin: Secondary | ICD-10-CM | POA: Diagnosis not present

## 2019-08-24 DIAGNOSIS — E7800 Pure hypercholesterolemia, unspecified: Secondary | ICD-10-CM | POA: Diagnosis not present

## 2019-08-24 DIAGNOSIS — Z79890 Hormone replacement therapy (postmenopausal): Secondary | ICD-10-CM | POA: Diagnosis not present

## 2019-08-24 LAB — SARS CORONAVIRUS 2 (TAT 6-24 HRS): SARS Coronavirus 2: NEGATIVE

## 2019-08-24 LAB — BASIC METABOLIC PANEL
Anion gap: 10 (ref 5–15)
BUN: 13 mg/dL (ref 8–23)
CO2: 27 mmol/L (ref 22–32)
Calcium: 9.4 mg/dL (ref 8.9–10.3)
Chloride: 101 mmol/L (ref 98–111)
Creatinine, Ser: 1.07 mg/dL — ABNORMAL HIGH (ref 0.44–1.00)
GFR calc Af Amer: >60 mL/min (ref >60)
GFR calc non Af Amer: 54 mL/min — ABNORMAL LOW (ref >60)
Glucose, Bld: 291 mg/dL — ABNORMAL HIGH (ref 70–99)
Potassium: 3.9 mmol/L (ref 3.5–5.1)
Sodium: 138 mmol/L (ref 135–145)

## 2019-08-24 NOTE — Progress Notes (Signed)
Glucose-291, Dr. Jillyn Hidden aware, will recheck day of surgery.

## 2019-08-24 NOTE — Progress Notes (Signed)

## 2019-08-26 ENCOUNTER — Ambulatory Visit
Admission: RE | Admit: 2019-08-26 | Discharge: 2019-08-26 | Disposition: A | Discharge status: Home / Self Care | Payer: No Typology Code available for payment source | Source: Ambulatory Visit | Attending: General Surgery | Admitting: General Surgery

## 2019-08-26 ENCOUNTER — Other Ambulatory Visit: Payer: Self-pay

## 2019-08-26 DIAGNOSIS — N6091 Unspecified benign mammary dysplasia of right breast: Secondary | ICD-10-CM

## 2019-08-26 IMAGING — MG MM PLC BREAST LOC DEV 1ST LESION INC*R*
8 of 12 series · 8 of 20 positions shown · non-contrast
Comparison: Previous exam(s).

CLINICAL DATA: Patient for preoperative localization prior to right
excision.

EXAM:
MAMMOGRAPHIC GUIDED RADIOACTIVE SEED LOCALIZATION OF THE RIGHT
BREAST

[R LM (1 of 3)]
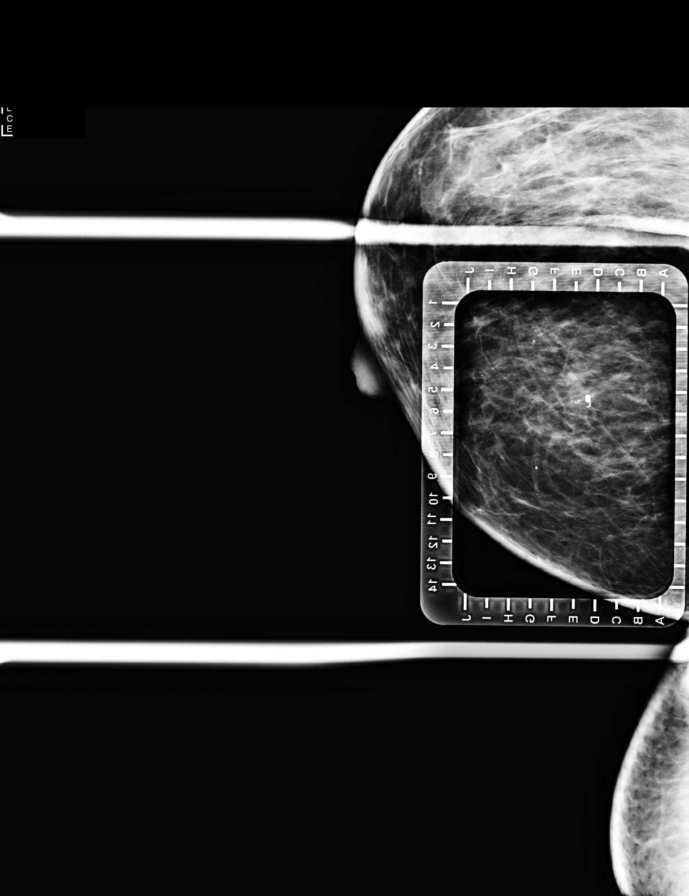

[R CC (1 of 5)]
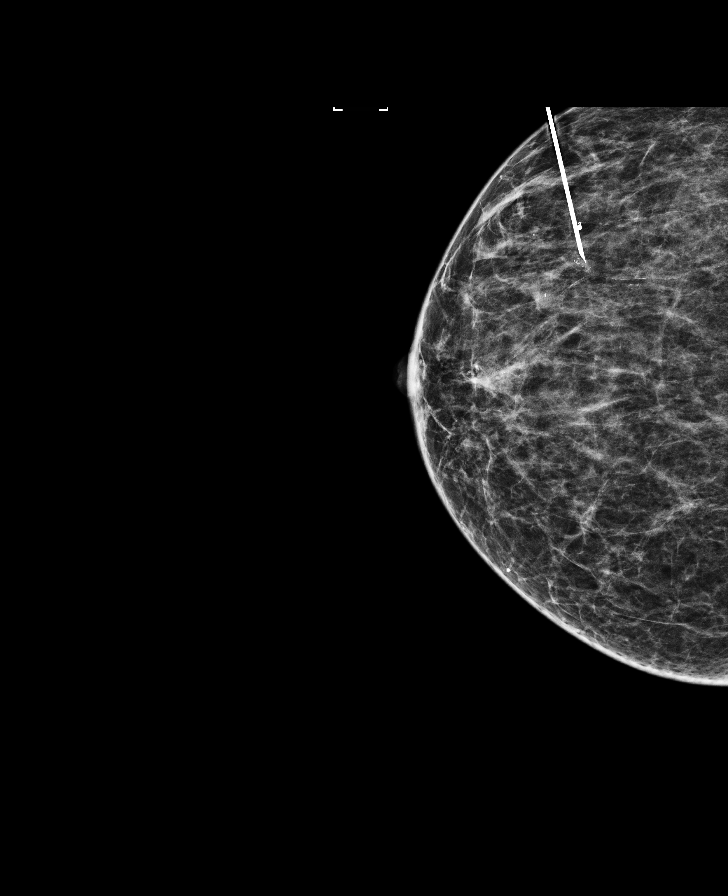

[R CC (2 of 5)]
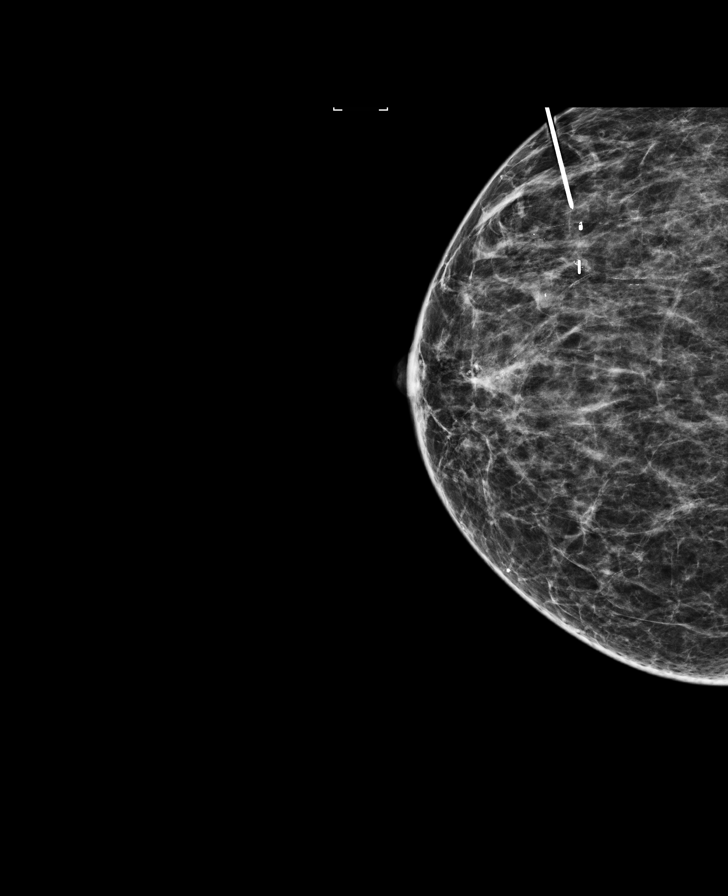

[R LM (2 of 3)]
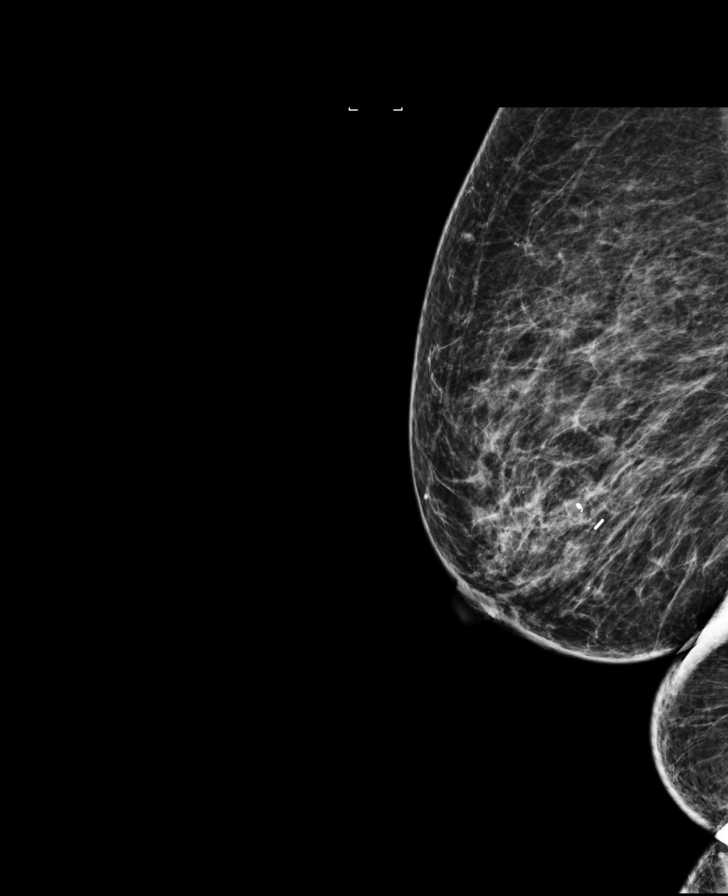

[R CC (3 of 5)]
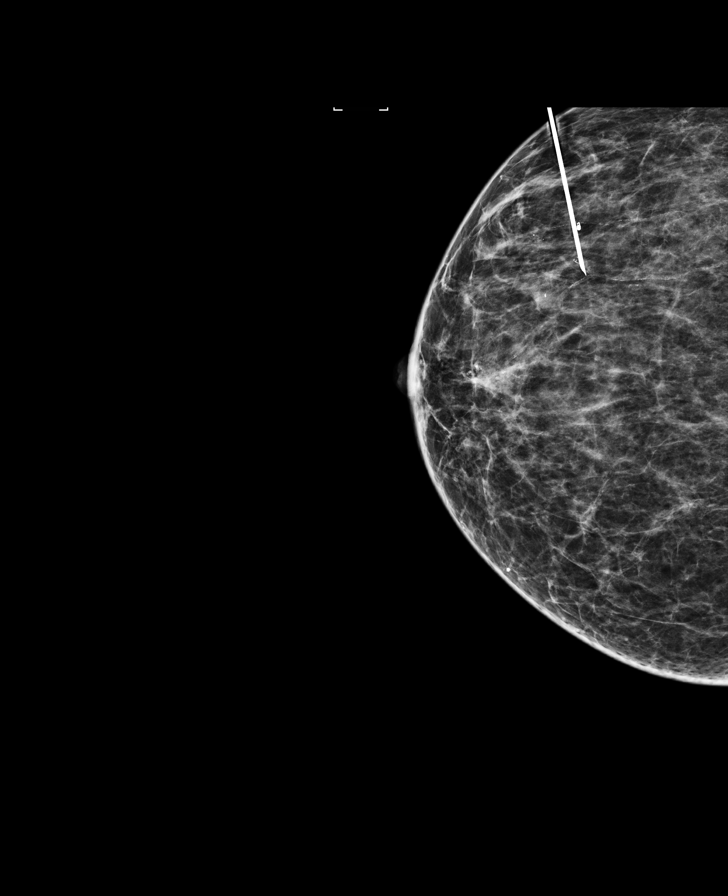

[R CC (4 of 5)]
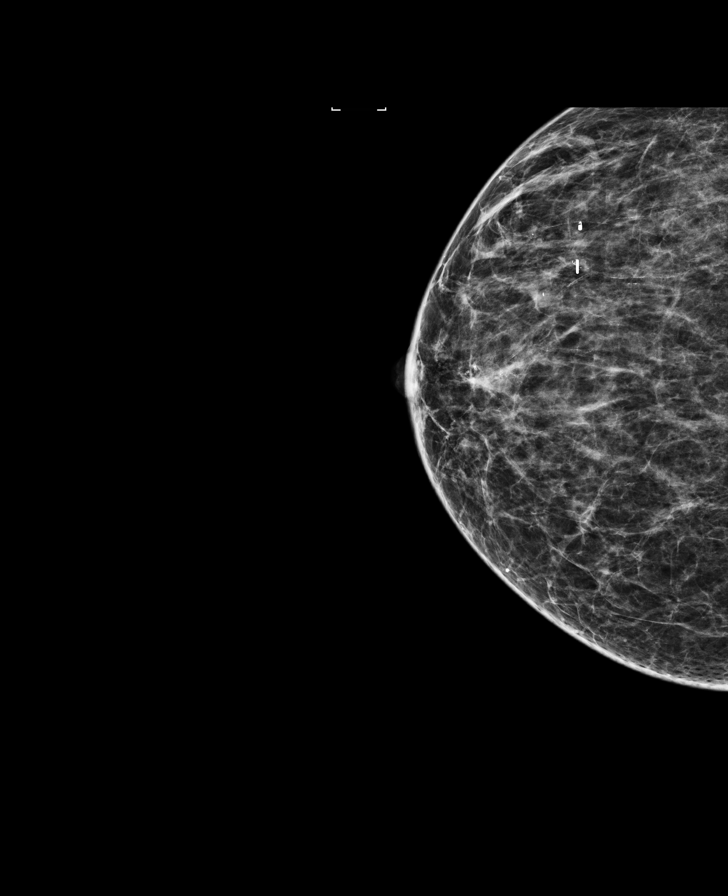

[R CC (5 of 5)]
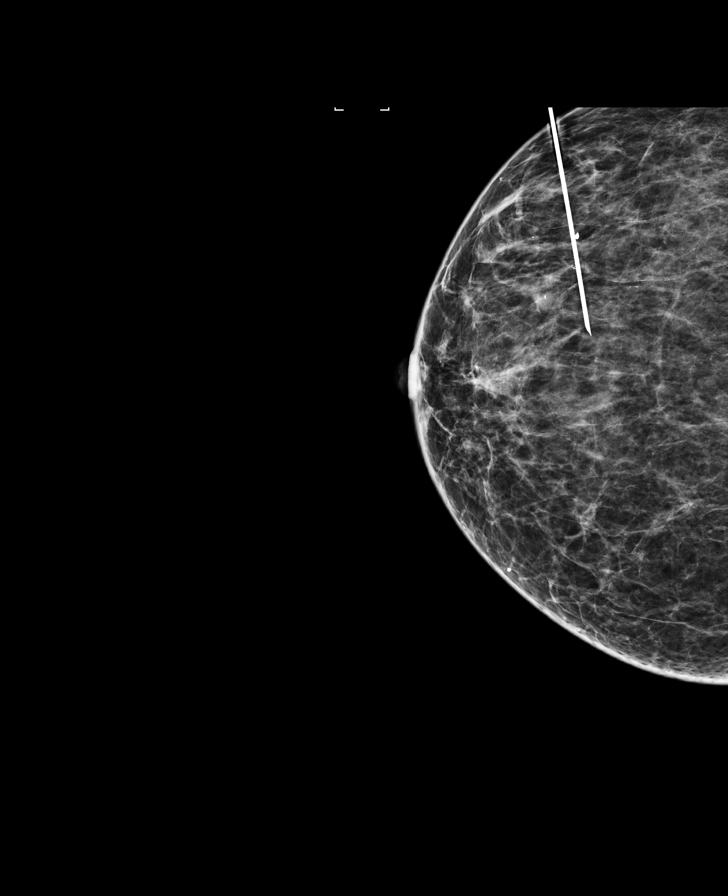

[R LM (3 of 3)]
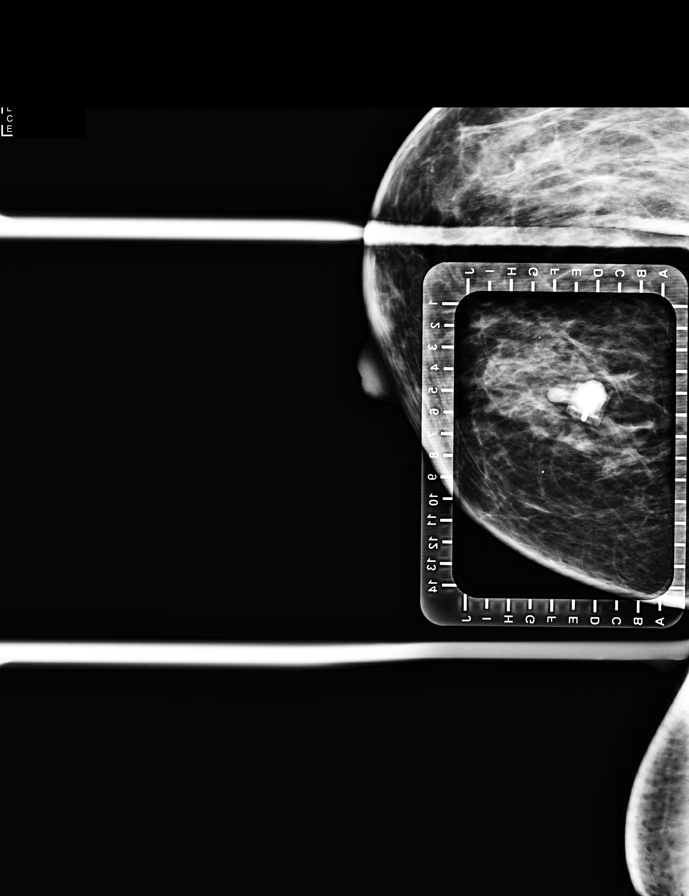

[8 of 20 positions shown; findings below may reference images not displayed]

FINDINGS: Patient presents for radioactive seed localization prior to right
excision. I met with the patient and we discussed the procedure of
seed localization including benefits and alternatives. We discussed
the high likelihood of a successful procedure. We discussed the
risks of the procedure including infection, bleeding, tissue injury
and further surgery. We discussed the low dose of radioactivity
involved in the procedure. Informed, written consent was given.

The usual time-out protocol was performed immediately prior to the
procedure.

Using mammographic guidance, sterile technique, 1% lidocaine and an
Y-GBL radioactive seed, residual calcifications were localized using
a lateral approach. The follow-up mammogram images confirm the seed
in the expected location and were marked for Dr. Ourari.

Follow-up survey of the patient confirms presence of the radioactive
seed.

Order number of Y-GBL seed:  [id number].

Total activity:  0.2 x 4 millicuries reference Date: 08/09/2019

The patient tolerated the procedure well and was released from the
[REDACTED]. She was given instructions regarding seed removal.
IMPRESSION: Radioactive seed localization right breast. No apparent
complications.

Note the radioactive seed was placed at the residual calcifications.
The seed and residual calcifications are located approximately 9 mm
medial to the coil shaped biopsy marking clip.

## 2019-08-27 ENCOUNTER — Encounter (HOSPITAL_BASED_OUTPATIENT_CLINIC_OR_DEPARTMENT_OTHER): Payer: Self-pay | Admitting: General Surgery

## 2019-08-27 ENCOUNTER — Ambulatory Visit (HOSPITAL_BASED_OUTPATIENT_CLINIC_OR_DEPARTMENT_OTHER)
Admission: RE | Admit: 2019-08-27 | Discharge: 2019-08-27 | Disposition: A | Discharge status: Home / Self Care | Payer: No Typology Code available for payment source | Attending: General Surgery | Admitting: General Surgery

## 2019-08-27 ENCOUNTER — Encounter (HOSPITAL_BASED_OUTPATIENT_CLINIC_OR_DEPARTMENT_OTHER)
Admission: RE | Disposition: A | Discharge status: Home / Self Care | Payer: Self-pay | Source: Home / Self Care | Attending: General Surgery

## 2019-08-27 ENCOUNTER — Ambulatory Visit
Admission: RE | Admit: 2019-08-27 | Discharge: 2019-08-27 | Disposition: A | Discharge status: Home / Self Care | Payer: No Typology Code available for payment source | Source: Ambulatory Visit | Attending: General Surgery | Admitting: General Surgery

## 2019-08-27 ENCOUNTER — Ambulatory Visit (HOSPITAL_BASED_OUTPATIENT_CLINIC_OR_DEPARTMENT_OTHER): Payer: No Typology Code available for payment source | Admitting: Anesthesiology

## 2019-08-27 ENCOUNTER — Other Ambulatory Visit: Payer: Self-pay

## 2019-08-27 DIAGNOSIS — Z7982 Long term (current) use of aspirin: Secondary | ICD-10-CM | POA: Insufficient documentation

## 2019-08-27 DIAGNOSIS — D241 Benign neoplasm of right breast: Secondary | ICD-10-CM | POA: Insufficient documentation

## 2019-08-27 DIAGNOSIS — K219 Gastro-esophageal reflux disease without esophagitis: Secondary | ICD-10-CM | POA: Insufficient documentation

## 2019-08-27 DIAGNOSIS — E119 Type 2 diabetes mellitus without complications: Secondary | ICD-10-CM | POA: Insufficient documentation

## 2019-08-27 DIAGNOSIS — I10 Essential (primary) hypertension: Secondary | ICD-10-CM | POA: Insufficient documentation

## 2019-08-27 DIAGNOSIS — F419 Anxiety disorder, unspecified: Secondary | ICD-10-CM | POA: Insufficient documentation

## 2019-08-27 DIAGNOSIS — I69351 Hemiplegia and hemiparesis following cerebral infarction affecting right dominant side: Secondary | ICD-10-CM | POA: Insufficient documentation

## 2019-08-27 DIAGNOSIS — E039 Hypothyroidism, unspecified: Secondary | ICD-10-CM | POA: Insufficient documentation

## 2019-08-27 DIAGNOSIS — E7800 Pure hypercholesterolemia, unspecified: Secondary | ICD-10-CM | POA: Insufficient documentation

## 2019-08-27 DIAGNOSIS — Z79890 Hormone replacement therapy (postmenopausal): Secondary | ICD-10-CM | POA: Insufficient documentation

## 2019-08-27 DIAGNOSIS — Z79899 Other long term (current) drug therapy: Secondary | ICD-10-CM | POA: Insufficient documentation

## 2019-08-27 DIAGNOSIS — N6091 Unspecified benign mammary dysplasia of right breast: Secondary | ICD-10-CM

## 2019-08-27 HISTORY — DX: Unspecified lump in the right breast, unspecified quadrant: N63.10

## 2019-08-27 HISTORY — PX: BREAST LUMPECTOMY WITH RADIOACTIVE SEED LOCALIZATION: SHX6424

## 2019-08-27 LAB — GLUCOSE, CAPILLARY
Glucose-Capillary: 163 mg/dL — ABNORMAL HIGH (ref 70–99)
Glucose-Capillary: 169 mg/dL — ABNORMAL HIGH (ref 70–99)

## 2019-08-27 IMAGING — MG MM BREAST SURGICAL SPECIMEN
1 series · 2 of 2 positions shown · non-contrast
Comparison: Previous exam(s).

CLINICAL DATA: Evaluate surgical specimen following excision of
RIGHT breast ADH.

EXAM:
SPECIMEN RADIOGRAPH OF THE RIGHT BREAST

[Series 1: R · right · 0.07mm/px · 2 of 2 slices shown]
[im 1/2]
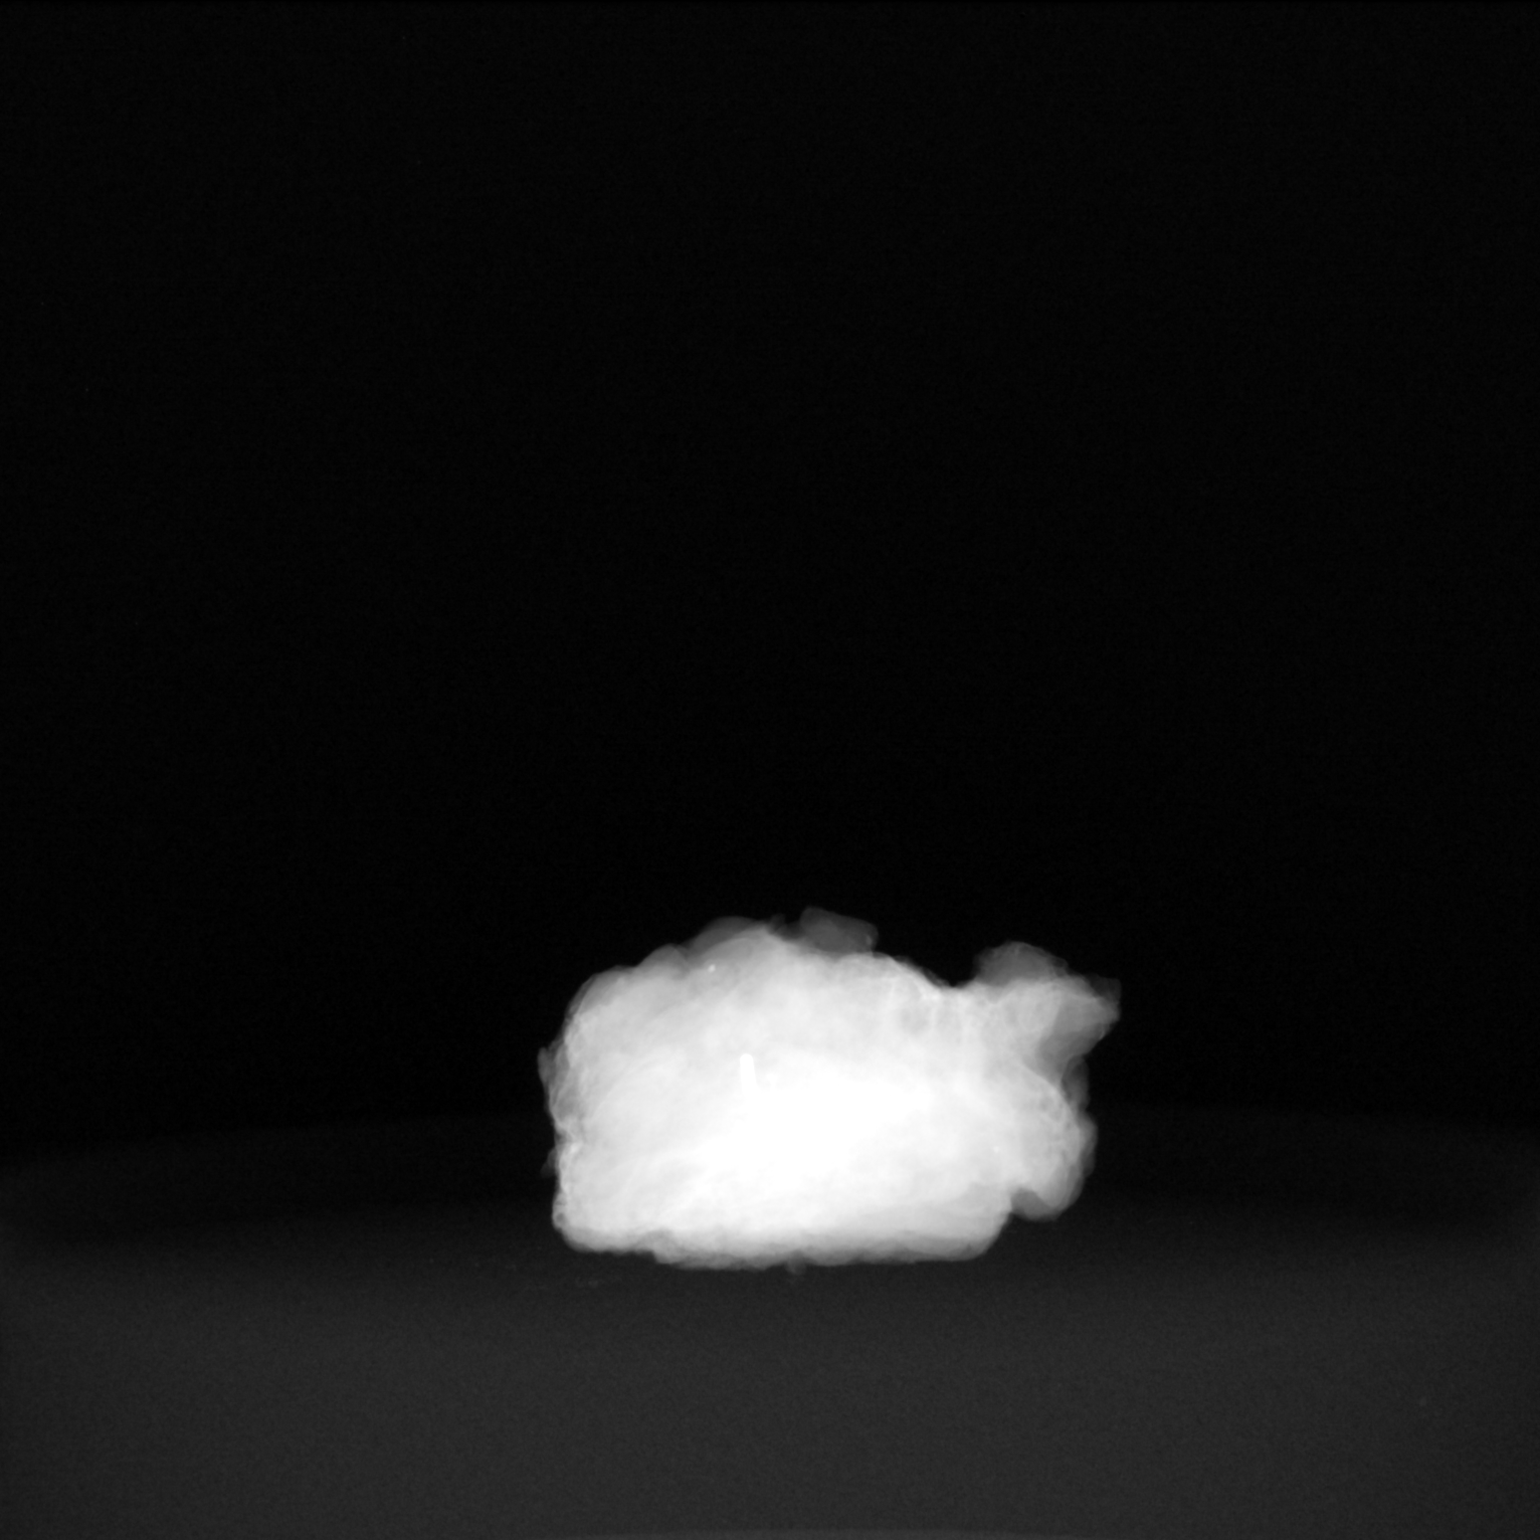
[im 2/2]
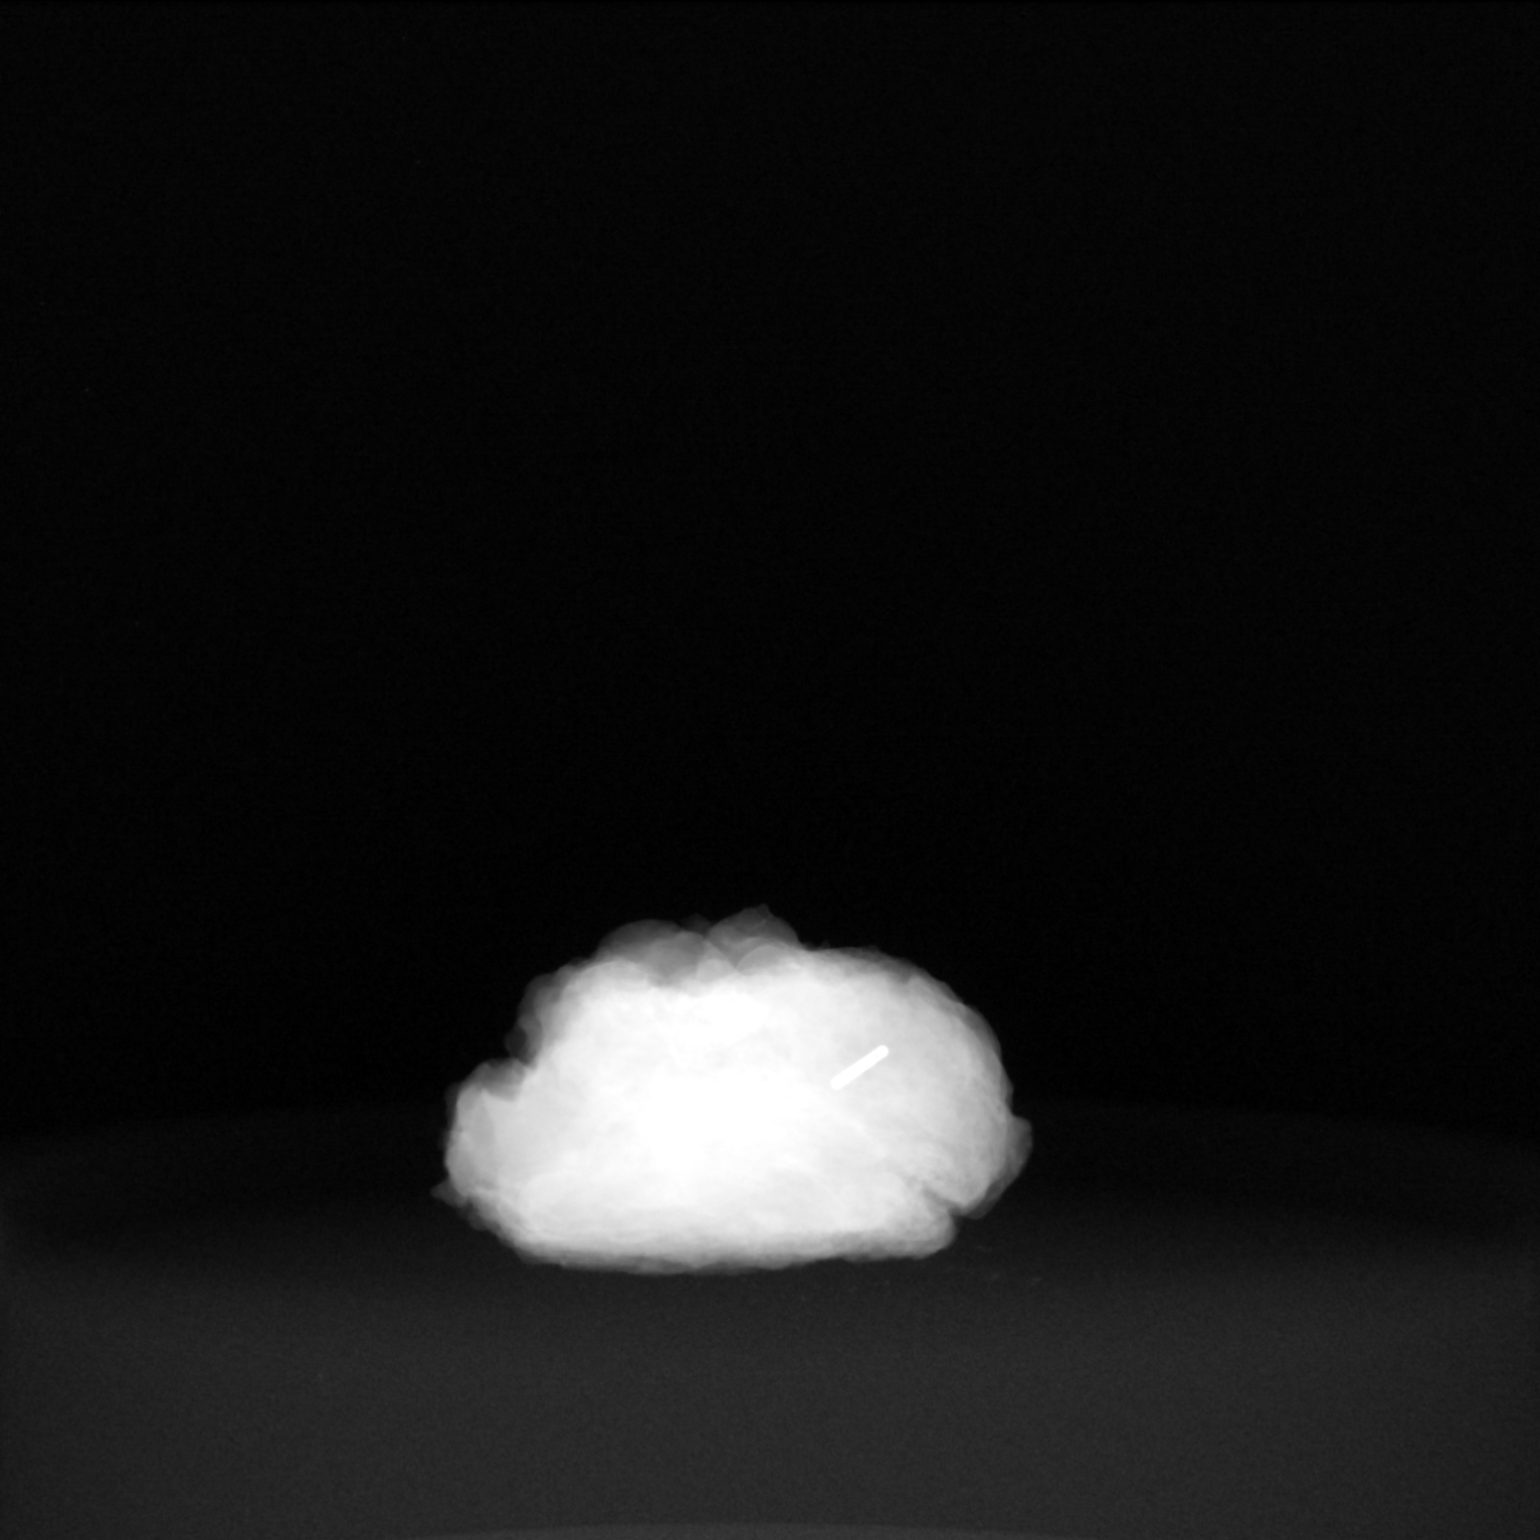

[2 of 2 positions shown; findings below may reference images not displayed]

FINDINGS: Status post excision of the RIGHT breast. The radioactive seed and
COIL biopsy marker clip are present and completely intact.
IMPRESSION: Specimen radiograph of the RIGHT breast.

## 2019-08-27 SURGERY — BREAST LUMPECTOMY WITH RADIOACTIVE SEED LOCALIZATION
Anesthesia: General | Site: Breast | Laterality: Right

## 2019-08-27 MED ORDER — CHLORHEXIDINE GLUCONATE CLOTH 2 % EX PADS: 6.0000 | MEDICATED_PAD | Freq: Once | CUTANEOUS | Status: DC

## 2019-08-27 MED ORDER — GABAPENTIN 300 MG PO CAPS
300.0000 mg | ORAL_CAPSULE | ORAL | Status: AC
  Administered 2019-08-27: 300 mg via ORAL

## 2019-08-27 MED ORDER — ONDANSETRON HCL 4 MG/2ML IJ SOLN
INTRAMUSCULAR | Status: DC | PRN
  Administered 2019-08-27: 4 mg via INTRAVENOUS

## 2019-08-27 MED ORDER — PROPOFOL 10 MG/ML IV BOLUS
INTRAVENOUS | Status: AC
  Filled 2019-08-27: qty 20

## 2019-08-27 MED ORDER — CELECOXIB 200 MG PO CAPS
200.0000 mg | ORAL_CAPSULE | ORAL | Status: AC
  Administered 2019-08-27: 200 mg via ORAL

## 2019-08-27 MED ORDER — MIDAZOLAM HCL 2 MG/2ML IJ SOLN
INTRAMUSCULAR | Status: DC | PRN
  Administered 2019-08-27 (×2): 1 mg via INTRAVENOUS

## 2019-08-27 MED ORDER — CELECOXIB 200 MG PO CAPS
ORAL_CAPSULE | ORAL | Status: AC
  Filled 2019-08-27: qty 1

## 2019-08-27 MED ORDER — GABAPENTIN 300 MG PO CAPS
ORAL_CAPSULE | ORAL | Status: AC
  Filled 2019-08-27: qty 1

## 2019-08-27 MED ORDER — HYDROCODONE-ACETAMINOPHEN 5-325 MG PO TABS: 1.0000 | ORAL_TABLET | Freq: Four times a day (QID) | ORAL | 0 refills | Status: DC | PRN

## 2019-08-27 MED ORDER — MIDAZOLAM HCL 2 MG/2ML IJ SOLN
INTRAMUSCULAR | Status: AC
  Filled 2019-08-27: qty 2

## 2019-08-27 MED ORDER — LACTATED RINGERS IV SOLN: INTRAVENOUS | Status: DC

## 2019-08-27 MED ORDER — 0.9 % SODIUM CHLORIDE (POUR BTL) OPTIME
TOPICAL | Status: DC | PRN
  Administered 2019-08-27: 300 mL

## 2019-08-27 MED ORDER — CEFAZOLIN SODIUM-DEXTROSE 2-4 GM/100ML-% IV SOLN
INTRAVENOUS | Status: AC
  Filled 2019-08-27: qty 100

## 2019-08-27 MED ORDER — BUPIVACAINE HCL (PF) 0.25 % IJ SOLN
INTRAMUSCULAR | Status: DC | PRN
  Administered 2019-08-27: 18 mL

## 2019-08-27 MED ORDER — ACETAMINOPHEN 500 MG PO TABS
1000.0000 mg | ORAL_TABLET | ORAL | Status: AC
  Administered 2019-08-27: 1000 mg via ORAL

## 2019-08-27 MED ORDER — DEXAMETHASONE SODIUM PHOSPHATE 10 MG/ML IJ SOLN
INTRAMUSCULAR | Status: AC
  Filled 2019-08-27: qty 1

## 2019-08-27 MED ORDER — FENTANYL CITRATE (PF) 100 MCG/2ML IJ SOLN
INTRAMUSCULAR | Status: AC
  Filled 2019-08-27: qty 2

## 2019-08-27 MED ORDER — FENTANYL CITRATE (PF) 100 MCG/2ML IJ SOLN: 25.0000 ug | INTRAMUSCULAR | Status: DC | PRN

## 2019-08-27 MED ORDER — ACETAMINOPHEN 500 MG PO TABS
ORAL_TABLET | ORAL | Status: AC
  Filled 2019-08-27: qty 2

## 2019-08-27 MED ORDER — ONDANSETRON HCL 4 MG/2ML IJ SOLN
INTRAMUSCULAR | Status: AC
  Filled 2019-08-27: qty 2

## 2019-08-27 MED ORDER — PROPOFOL 10 MG/ML IV BOLUS
INTRAVENOUS | Status: DC | PRN
  Administered 2019-08-27: 110 mg via INTRAVENOUS

## 2019-08-27 MED ORDER — LIDOCAINE HCL (CARDIAC) PF 100 MG/5ML IV SOSY
PREFILLED_SYRINGE | INTRAVENOUS | Status: DC | PRN
  Administered 2019-08-27: 90 mg via INTRAVENOUS

## 2019-08-27 MED ORDER — CEFAZOLIN SODIUM-DEXTROSE 2-4 GM/100ML-% IV SOLN
2.0000 g | INTRAVENOUS | Status: AC
  Administered 2019-08-27: 2 g via INTRAVENOUS

## 2019-08-27 MED ORDER — FENTANYL CITRATE (PF) 100 MCG/2ML IJ SOLN
INTRAMUSCULAR | Status: DC | PRN
Start: 2019-08-27 — End: 2019-08-27
  Administered 2019-08-27: 50 ug via INTRAVENOUS

## 2019-08-27 SURGICAL SUPPLY — 47 items
ADH SKN CLS APL DERMABOND .7 (GAUZE/BANDAGES/DRESSINGS) ×1
APL PRP STRL LF DISP 70% ISPRP (MISCELLANEOUS) ×1
APPLIER CLIP 9.375 MED OPEN (MISCELLANEOUS)
APR CLP MED 9.3 20 MLT OPN (MISCELLANEOUS)
BLADE SURG 15 STRL LF DISP TIS (BLADE) ×1 IMPLANT
BLADE SURG 15 STRL SS (BLADE) ×6
CANISTER SUC SOCK COL 7IN (MISCELLANEOUS) ×1 IMPLANT
CANISTER SUCT 1200ML W/VALVE (MISCELLANEOUS) ×3 IMPLANT
CHLORAPREP W/TINT 26 (MISCELLANEOUS) ×3 IMPLANT
CLIP APPLIE 9.375 MED OPEN (MISCELLANEOUS) IMPLANT
COVER BACK TABLE 60X90IN (DRAPES) ×3 IMPLANT
COVER MAYO STAND STRL (DRAPES) ×3 IMPLANT
COVER PROBE W GEL 5X96 (DRAPES) ×3 IMPLANT
COVER WAND RF STERILE (DRAPES) IMPLANT
DECANTER SPIKE VIAL GLASS SM (MISCELLANEOUS) IMPLANT
DERMABOND ADVANCED (GAUZE/BANDAGES/DRESSINGS) ×2
DERMABOND ADVANCED .7 DNX12 (GAUZE/BANDAGES/DRESSINGS) ×1 IMPLANT
DRAPE LAPAROSCOPIC ABDOMINAL (DRAPES) ×3 IMPLANT
DRAPE UTILITY XL STRL (DRAPES) ×3 IMPLANT
ELECT COATED BLADE 2.86 ST (ELECTRODE) ×3 IMPLANT
ELECT REM PT RETURN 9FT ADLT (ELECTROSURGICAL) ×3
ELECTRODE REM PT RTRN 9FT ADLT (ELECTROSURGICAL) ×1 IMPLANT
GLOVE BIO SURGEON STRL SZ7.5 (GLOVE) ×6 IMPLANT
GLOVE BIOGEL PI IND STRL 7.0 (GLOVE) IMPLANT
GLOVE BIOGEL PI INDICATOR 7.0 (GLOVE) ×4
GLOVE ECLIPSE 6.5 STRL STRAW (GLOVE) ×2 IMPLANT
GOWN STRL REUS W/ TWL LRG LVL3 (GOWN DISPOSABLE) ×2 IMPLANT
GOWN STRL REUS W/TWL LRG LVL3 (GOWN DISPOSABLE) ×6
ILLUMINATOR WAVEGUIDE N/F (MISCELLANEOUS) IMPLANT
KIT MARKER MARGIN INK (KITS) ×3 IMPLANT
LIGHT WAVEGUIDE WIDE FLAT (MISCELLANEOUS) IMPLANT
NDL HYPO 25X1 1.5 SAFETY (NEEDLE) IMPLANT
NEEDLE HYPO 25X1 1.5 SAFETY (NEEDLE) ×3 IMPLANT
NS IRRIG 1000ML POUR BTL (IV SOLUTION) ×2 IMPLANT
PACK BASIN DAY SURGERY FS (CUSTOM PROCEDURE TRAY) ×3 IMPLANT
PENCIL SMOKE EVACUATOR (MISCELLANEOUS) ×3 IMPLANT
SLEEVE SCD COMPRESS KNEE MED (MISCELLANEOUS) ×3 IMPLANT
SPONGE LAP 18X18 RF (DISPOSABLE) ×3 IMPLANT
SUT MON AB 4-0 PC3 18 (SUTURE) ×3 IMPLANT
SUT SILK 2 0 SH (SUTURE) IMPLANT
SUT VICRYL 3-0 CR8 SH (SUTURE) ×3 IMPLANT
SYR CONTROL 10ML LL (SYRINGE) ×2 IMPLANT
TOWEL GREEN STERILE FF (TOWEL DISPOSABLE) ×3 IMPLANT
TRAY FAXITRON CT DISP (TRAY / TRAY PROCEDURE) ×3 IMPLANT
TUBE CONNECTING 20'X1/4 (TUBING) ×1
TUBE CONNECTING 20X1/4 (TUBING) ×2 IMPLANT
YANKAUER SUCT BULB TIP NO VENT (SUCTIONS) ×2 IMPLANT

## 2019-08-27 NOTE — Anesthesia Preprocedure Evaluation (Addendum)
Anesthesia Evaluation  Patient identified by MRN, date of birth, ID band Patient awake    Reviewed: Allergy & Precautions, H&P , NPO status , Patient's Chart, lab work & pertinent test results  Airway Mallampati: II  TM Distance: >3 FB Neck ROM: Full    Dental no notable dental hx. (+) Chipped,    Pulmonary neg pulmonary ROS,    Pulmonary exam normal breath sounds clear to auscultation       Cardiovascular hypertension, Pt. on medications  Rhythm:Regular Rate:Normal     Neuro/Psych Depression CVA, Residual Symptoms    GI/Hepatic Neg liver ROS, GERD  Medicated,  Endo/Other  negative endocrine ROSdiabetes, Type 2, Oral Hypoglycemic AgentsHypothyroidism   Renal/GU negative Renal ROS  negative genitourinary   Musculoskeletal   Abdominal   Peds  Hematology negative hematology ROS (+)   Anesthesia Other Findings   Reproductive/Obstetrics negative OB ROS                            Anesthesia Physical Anesthesia Plan  ASA: III  Anesthesia Plan: General   Post-op Pain Management:    Induction: Intravenous  PONV Risk Score and Plan: 4 or greater and Ondansetron and Dexamethasone  Airway Management Planned: LMA  Additional Equipment:   Intra-op Plan:   Post-operative Plan: Extubation in OR  Informed Consent: I have reviewed the patients History and Physical, chart, labs and discussed the procedure including the risks, benefits and alternatives for the proposed anesthesia with the patient or authorized representative who has indicated his/her understanding and acceptance.     Dental advisory given  Plan Discussed with: CRNA  Anesthesia Plan Comments:         Anesthesia Quick Evaluation

## 2019-08-27 NOTE — H&P (Signed)
Ann Weiss  Location: [facility] Patient #: [id number] DOB: July 29, 1950 Married / Language: English / Race: Black or African American Female   History of Present Illness The patient is a 69 year old female who presents with a complaint of Breast problems. We are asked to see the patient in consultation by Dr. polite to evaluate her for a area of atypical ductal hyperplasia in the right breast. The patient is a 69 year old black female who recently went for a routine screening mammogram. At that time she was found to have an 8 mm area of calcification in the lower outer quadrant of the right breast that looked abnormal. This was biopsied and came back as atypical duct hyperplasia. She has no family history of breast cancer. She does have a history of stroke which has left her a little bit weak on the right side. She only takes an aspirin for this but is followed by a neurologist   Past Surgical History  Breast Biopsy  Right. Cesarean Section - 1  Colon Polyp Removal - Colonoscopy   Diagnostic Studies History  Colonoscopy  1-5 years ago Mammogram  within last year Pap Smear  1-5 years ago  Allergies  Cipro *FLUOROQUINOLONES*  Allergies Reconciled   Medication History  ALPRAZolam (0.5MG  Tablet, Oral) Active. amLODIPine Besylate (10MG  Tablet, Oral) Active. Aspirin (81MG  Tablet, Oral) Active. Cyclobenzaprine HCl (10MG  Tablet, Oral) Active. hydrALAZINE HCl (50MG  Tablet, Oral) Active. Linzess (145MCG Capsule, Oral) Active. Levothyroxine Sodium (75MCG Tablet, Oral) Active. metFORMIN HCl (500MG  Tablet, Oral) Active. metFORMIN HCl (1000MG  Tablet, Oral) Active. Omeprazole (20MG  Capsule DR, Oral) Active. Rosuvastatin Calcium (10MG  Tablet, Oral) Active. Telmisartan-HCTZ (80-25MG  Tablet, Oral) Active. Vitamin C (250MG  Tablet Chewable, Oral) Active. Medications Reconciled  Social History  Alcohol use  Occasional alcohol use. Caffeine use   Carbonated beverages, Coffee. No drug use  Tobacco use  Never smoker.  Family History  Arthritis  Father, Mother. Cerebrovascular Accident  Father. Diabetes Mellitus  Mother. Heart Disease  Father, Mother. Heart disease in female family member before age 70  Heart disease in female family member before age 41  Hypertension  Father, Mother. Kidney Disease  Mother.  Pregnancy / Birth History  Age at menarche  24 years. Age of menopause  28-50 Gravida  2 Length (months) of breastfeeding  3-6 Maternal age  80-25 Para  2  Other Problems  Anxiety Disorder  Arthritis  Cerebrovascular Accident  Diabetes Mellitus  Gastroesophageal Reflux Disease  High blood pressure  Hypercholesterolemia  Lump In Breast  Thyroid Disease     Review of Systems  General Not Present- Appetite Loss, Chills, Fatigue, Fever, Night Sweats, Weight Gain and Weight Loss. Skin Not Present- Change in Wart/Mole, Dryness, Hives, Jaundice, New Lesions, Non-Healing Wounds, Rash and Ulcer. HEENT Present- Wears glasses/contact lenses. Not Present- Earache, Hearing Loss, Hoarseness, Nose Bleed, Oral Ulcers, Ringing in the Ears, Seasonal Allergies, Sinus Pain, Sore Throat, Visual Disturbances and Yellow Eyes. Breast Present- Breast Mass. Not Present- Breast Pain, Nipple Discharge and Skin Changes. Cardiovascular Not Present- Chest Pain, Difficulty Breathing Lying Down, Leg Cramps, Palpitations, Rapid Heart Rate, Shortness of Breath and Swelling of Extremities. Gastrointestinal Present- Constipation. Not Present- Abdominal Pain, Bloating, Bloody Stool, Change in Bowel Habits, Chronic diarrhea, Difficulty Swallowing, Excessive gas, Gets full quickly at meals, Hemorrhoids, Indigestion, Nausea, Rectal Pain and Vomiting. Female Genitourinary Not Present- Frequency, Nocturia, Painful Urination, Pelvic Pain and Urgency. Musculoskeletal Present- Joint Stiffness. Not Present- Back Pain, Joint Pain, Muscle  Pain, Muscle Weakness and Swelling of Extremities.  Neurological Present- Trouble walking and Weakness. Not Present- Decreased Memory, Fainting, Headaches, Numbness, Seizures, Tingling and Tremor. Endocrine Not Present- Cold Intolerance, Excessive Hunger, Hair Changes, Heat Intolerance, Hot flashes and New Diabetes. Hematology Present- Blood Thinners. Not Present- Easy Bruising, Excessive bleeding, Gland problems, HIV and Persistent Infections.  Vitals  Weight: 192.13 lb Height: 66in Body Surface Area: 1.97 m Body Mass Index: 31.01 kg/m  Temp.: 96.63F  BP: 134/84(Sitting, Left Arm, Standard)       Physical Exam  General Mental Status-Alert. General Appearance-Consistent with stated age. Hydration-Well hydrated. Voice-Normal.  Head and Neck Head-normocephalic, atraumatic with no lesions or palpable masses. Trachea-midline. Thyroid Gland Characteristics - normal size and consistency.  Eye Eyeball - Bilateral-Extraocular movements intact. Sclera/Conjunctiva - Bilateral-No scleral icterus.  Chest and Lung Exam Chest and lung exam reveals -quiet, even and easy respiratory effort with no use of accessory muscles and on auscultation, normal breath sounds, no adventitious sounds and normal vocal resonance. Inspection Chest Wall - Normal. Back - normal.  Breast Note: There is a small palpable bruise in the lower outer right breast. Other than this there is no palpable mass in either breast. There is no palpable axillary, supraclavicular, or cervical lymphadenopathy.   Cardiovascular Cardiovascular examination reveals -normal heart sounds, regular rate and rhythm with no murmurs and normal pedal pulses bilaterally.  Abdomen Inspection Inspection of the abdomen reveals - No Hernias. Skin - Scar - no surgical scars. Palpation/Percussion Palpation and Percussion of the abdomen reveal - Soft, Non Tender, No Rebound tenderness, No Rigidity (guarding)  and No hepatosplenomegaly. Auscultation Auscultation of the abdomen reveals - Bowel sounds normal.  Neurologic Neurologic evaluation reveals -alert and oriented x 3 with no impairment of recent or remote memory. Mental Status-Normal.  Musculoskeletal Normal Exam - Left-Upper Extremity Strength Normal and Lower Extremity Strength Normal. Normal Exam - Right-Upper Extremity Strength Normal and Lower Extremity Strength Normal.  Lymphatic Head & Neck  General Head & Neck Lymphatics: Bilateral - Description - Normal. Axillary  General Axillary Region: Bilateral - Description - Normal. Tenderness - Non Tender. Femoral & Inguinal  Generalized Femoral & Inguinal Lymphatics: Bilateral - Description - Normal. Tenderness - Non Tender.    Assessment & Plan  ATYPICAL DUCTAL HYPERPLASIA OF RIGHT BREAST (N60.91) Impression: The patient appears to have an 8 mm area of atypical duct hyperplasia in the lower outer quadrant of the right breast. Because atypical ductal hyperplasia can have a very similar appearance to her DCIS and because it is considered a high-risk lesion I would recommend that this area be removed. I have discussed with her in detail the risks and benefits of the operation as well as some of the technical aspects including the use of a radioactive seed for localization and she understands. She would like to target her family about it before planning surgery. The presence of atypical ductal hyperplasia raises her risk of breast cancer from about 7% to approximately 27%. Given her history of stroke I will also get clearance from her neurologist and medical doctor, Dr. Metta Clines and Dr. Maudry Mayhew. This patient encounter took 60 minutes today to perform the following: take history, perform exam, review outside records, interpret imaging, counsel the patient on their diagnosis and document encounter, findings & plan in the EHR Current Plans Referred to Oncology, for evaluation and  follow up (Oncology). Routine.

## 2019-08-27 NOTE — Discharge Instructions (Signed)
  Post Anesthesia Home Care Instructions  Activity: Get plenty of rest for the remainder of the day. A responsible individual must stay with you for 24 hours following the procedure.  For the next 24 hours, DO NOT: -Drive a car -Paediatric nurse -Drink alcoholic beverages -Take any medication unless instructed by your physician -Make any legal decisions or sign important papers.  Meals: Start with liquid foods such as gelatin or soup. Progress to regular foods as tolerated. Avoid greasy, spicy, heavy foods. If nausea and/or vomiting occur, drink only clear liquids until the nausea and/or vomiting subsides. Call your physician if vomiting continues.  Special Instructions/Symptoms: Your throat may feel dry or sore from the anesthesia or the breathing tube placed in your throat during surgery. If this causes discomfort, gargle with warm salt water. The discomfort should disappear within 24 hours.  If you had a scopolamine patch placed behind your ear for the management of post- operative nausea and/or vomiting:  1. The medication in the patch is effective for 72 hours, after which it should be removed.  Wrap patch in a tissue and discard in the trash. Wash hands thoroughly with soap and water. 2. You may remove the patch earlier than 72 hours if you experience unpleasant side effects which may include dry mouth, dizziness or visual disturbances. 3. Avoid touching the patch. Wash your hands with soap and water after contact with the patch.  No tylenol today until after 4pm if needed. No ibuprofen today until after 4pm if needed.

## 2019-08-27 NOTE — Anesthesia Postprocedure Evaluation (Signed)
Anesthesia Post Note  Patient: Shelvie Salsberry  Procedure(s) Performed: RIGHT BREAST LUMPECTOMY WITH RADIOACTIVE SEED LOCALIZATION (Right Breast)     Patient location during evaluation: PACU Anesthesia Type: General Level of consciousness: awake and alert Pain management: pain level controlled Vital Signs Assessment: post-procedure vital signs reviewed and stable Respiratory status: spontaneous breathing, nonlabored ventilation and respiratory function stable Cardiovascular status: blood pressure returned to baseline and stable Postop Assessment: no apparent nausea or vomiting Anesthetic complications: no   No complications documented.  Last Vitals:  Vitals:   08/27/19 1245 08/27/19 1317  BP: (!) 155/81 131/61  Pulse: 64 65  Resp: 10 16  Temp:  36.9 C  SpO2: 97% 100%    Last Pain:  Vitals:   08/27/19 1317  TempSrc:   PainSc: 0-No pain                 Kwamane Whack,W. EDMOND

## 2019-08-27 NOTE — Transfer of Care (Signed)
Immediate Anesthesia Transfer of Care Note  Patient: Ann Weiss  Procedure(s) Performed: RIGHT BREAST LUMPECTOMY WITH RADIOACTIVE SEED LOCALIZATION (Right Breast)  Patient Location: PACU  Anesthesia Type:General  Level of Consciousness: awake, alert  and oriented  Airway & Oxygen Therapy: Patient Spontanous Breathing and Patient connected to face mask oxygen  Post-op Assessment: Report given to RN and Post -op Vital signs reviewed and stable  Post vital signs: Reviewed and stable  Last Vitals:  Vitals Value Taken Time  BP 103/57 08/27/19 1200  Temp    Pulse 53 08/27/19 1202  Resp 11 08/27/19 1202  SpO2 100 % 08/27/19 1202  Vitals shown include unvalidated device data.  Last Pain:  Vitals:   08/27/19 1001  TempSrc: Oral  PainSc: 0-No pain         Complications: No complications documented.

## 2019-08-27 NOTE — Interval H&P Note (Signed)
History and Physical Interval Note:  08/27/2019 10:37 AM  Ann Weiss  has presented today for surgery, with the diagnosis of RIGHT BREAST ATYPICAL DUCTAL HYPERPLASIA.  The various methods of treatment have been discussed with the patient and family. After consideration of risks, benefits and other options for treatment, the patient has consented to  Procedure(s): RIGHT BREAST LUMPECTOMY WITH RADIOACTIVE SEED LOCALIZATION (Right) as a surgical intervention.  The patient's history has been reviewed, patient examined, no change in status, stable for surgery.  I have reviewed the patient's chart and labs.  Questions were answered to the patient's satisfaction.     [name]

## 2019-08-27 NOTE — Anesthesia Procedure Notes (Signed)
Procedure Name: LMA Insertion Performed by: Sundiata Ferrick M, CRNA Pre-anesthesia Checklist: Patient identified, Emergency Drugs available, Suction available and Patient being monitored Patient Re-evaluated:Patient Re-evaluated prior to induction Oxygen Delivery Method: Circle system utilized Preoxygenation: Pre-oxygenation with 100% oxygen Induction Type: IV induction Ventilation: Mask ventilation without difficulty LMA: LMA inserted LMA Size: 4.0 Number of attempts: 1 Airway Equipment and Method: Bite block Placement Confirmation: positive ETCO2 Tube secured with: Tape Dental Injury: Teeth and Oropharynx as per pre-operative assessment        

## 2019-08-27 NOTE — Op Note (Signed)
08/27/2019  11:50 AM  PATIENT:  Ann Weiss  69 y.o. female  PRE-OPERATIVE DIAGNOSIS:  RIGHT BREAST ATYPICAL DUCTAL HYPERPLASIA  POST-OPERATIVE DIAGNOSIS:  RIGHT BREAST ATYPICAL DUCTAL HYPERPLASIA  PROCEDURE:  Procedure(s): RIGHT BREAST LUMPECTOMY WITH RADIOACTIVE SEED LOCALIZATION (Right)  SURGEON:  Surgeon(s) and Role:    * Jovita Kussmaul, MD - Primary  PHYSICIAN ASSISTANT:   ASSISTANTS: none   ANESTHESIA:   local and general  EBL:  minimal   BLOOD ADMINISTERED:none  DRAINS: none   LOCAL MEDICATIONS USED:  MARCAINE     SPECIMEN:  Source of Specimen:  right breast tissue  DISPOSITION OF SPECIMEN:  PATHOLOGY  COUNTS:  YES  TOURNIQUET:  * No tourniquets in log *  DICTATION: .Dragon Dictation   After informed consent was obtained the patient was brought to the operating room and placed in the supine position on the operating table. After adequate induction of general anesthesia the patient's right breast was prepped with ChloraPrep, allowed to dry, and draped in usual sterile manner. An appropriate timeout was performed. Previously an I-125 seed was placed in the lower outer quadrant of the right breast to mark an area of atypical ductal hyperplasia. The neoprobe was set to I-125 in the area of radioactivity was readily identified. The area around this was infiltrated with quarter percent Marcaine. A curvilinear incision was then made along the lower outer edge of the areola with a 15 blade knife. The incision was carried through the skin and subcutaneous tissue sharply with the electrocautery. Dissection was then carried out towards the radioactive seed under the direction of the neoprobe. Once I more closely approached the radioactive seed I then removed a circular portion of breast tissue sharply with the electrocautery around the radioactive seed while checking the area of radioactivity frequently. Once the specimen was removed it was oriented with the appropriate  paint colors. A specimen radiograph was obtained that showed the clip and seed to be within the specimen. The specimen was then sent to pathology for further evaluation. Hemostasis was achieved using the Bovie electrocautery. The wound was irrigated with saline and infiltrated with more quarter percent Marcaine. The deep layer of the wound was then closed with layers of interrupted 3-0 Vicryl stitches. The skin was closed with interrupted 4-0 Monocryl subcuticular stitches. Dermabond dressings were applied. The patient tolerated the procedure well. At the end of the case all needle sponge and instrument counts were correct. The patient was then awakened and taken to recovery in stable condition.  PLAN OF CARE: Discharge to home after PACU  PATIENT DISPOSITION:  PACU - hemodynamically stable.   Delay start of Pharmacological VTE agent (>24hrs) due to surgical blood loss or risk of bleeding: not applicable

## 2019-08-30 LAB — SURGICAL PATHOLOGY

## 2019-08-31 ENCOUNTER — Encounter (HOSPITAL_BASED_OUTPATIENT_CLINIC_OR_DEPARTMENT_OTHER): Payer: Self-pay | Admitting: General Surgery

## 2019-09-23 ENCOUNTER — Telehealth: Payer: Self-pay | Admitting: Hematology and Oncology

## 2019-09-23 NOTE — Telephone Encounter (Signed)
Ms. Neiswonger returned my call and has been scheduled to see r. Gudena on 10/11 at 1pm. Pt aware to arrive 15 minutes early.

## 2019-10-11 ENCOUNTER — Other Ambulatory Visit: Payer: Self-pay | Admitting: Internal Medicine

## 2019-10-11 DIAGNOSIS — M8588 Other specified disorders of bone density and structure, other site: Secondary | ICD-10-CM

## 2019-10-18 ENCOUNTER — Telehealth: Payer: Self-pay | Admitting: Hematology and Oncology

## 2019-10-18 ENCOUNTER — Inpatient Hospital Stay: Payer: No Typology Code available for payment source | Admitting: Hematology and Oncology

## 2019-10-18 DIAGNOSIS — D241 Benign neoplasm of right breast: Secondary | ICD-10-CM | POA: Insufficient documentation

## 2019-10-18 NOTE — Assessment & Plan Note (Deleted)
Screening mammogram on 05/05/19 showed right breast calcifications. Diagnostic mammogram on 05/14/19 showed right breast calcifications, 0.8cm. 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 Right lumpectomy on 08/27/19 with Dr. Marlou Starks: Hyalinized intraductal papilloma and usual duct epithelial hyperplasia.  Pathology review: I discussed the difference between usual ductal hyperplasia, atypical ductal hyperplasia, DCIS and invasive breast cancer. I explained to her that atypical ductal hyperplasia is characterized by a proliferation of uniform epithelial cells filling part, but not the entirety, of the involved duct. ADH is associated with an increased risk of both ipsilateral and contralateral breast cancer and thus provides evidence of underlying breast abnormalities that predispose to breast cancer.   Prognosis:  I discussed the risks and benefits of tamoxifen therapy.  Tamoxifen counseling:We discussed the risks and benefits of tamoxifen. These include but not limited to insomnia, hot flashes, mood changes, vaginal dryness, and weight gain. Although rare, serious side effects including endometrial cancer, risk of blood clots were also discussed. Patient understands these risks and consented to starting treatment. Planned treatment duration is 5 years.  In addition, I recommended the following 1. Exercise 30 minutes daily 2. Weight loss 3. Increasing fruits and vegetables and less red meat I believe all of the above would extensively decrease her risk of breast cancer as well as other cancers including cancers of the colon substantially.  Surveillance plan: Annual mammograms and breast exams

## 2019-10-18 NOTE — Telephone Encounter (Signed)
Pt cld to r/s her new pt appt to 11/8 at 1pm to see Dr. Lindi Adie.

## 2019-11-15 ENCOUNTER — Telehealth: Payer: Self-pay | Admitting: Hematology and Oncology

## 2019-11-15 ENCOUNTER — Inpatient Hospital Stay
Payer: No Typology Code available for payment source | Attending: Hematology and Oncology | Admitting: Hematology and Oncology

## 2019-11-15 ENCOUNTER — Other Ambulatory Visit: Payer: Self-pay

## 2019-11-15 VITALS — BP 157/70 | HR 72 | Temp 98.3°F | Resp 18 | Ht 66.0 in | Wt 198.7 lb

## 2019-11-15 DIAGNOSIS — D241 Benign neoplasm of right breast: Secondary | ICD-10-CM

## 2019-11-15 DIAGNOSIS — Z9189 Other specified personal risk factors, not elsewhere classified: Secondary | ICD-10-CM | POA: Diagnosis present

## 2019-11-15 NOTE — Assessment & Plan Note (Signed)
05/26/2019: Right breast biopsy LOQ: Hyalinized intraductal papilloma with calcifications, atypical ductal hyperplasia. 08/27/2019: Right lumpectomy: Hyalinized intraductal papilloma.  Usual ductal epithelial hyperplasia.  PASH: Pseudo-angiomatous stromal hyperplasia is a benign proliferation of the stroma that appears to be like a vascular lesion. It may present as a mass or thickening on exam and commonly found on mammograms as a well-defined mass. Excisional biopsy should be performed for most of these cases. However if it was diagnosed without any mass then surgical excision is not necessary. There is no increased risk of subsequent breast cancer associated with PASH.  Ann Weiss risk:

## 2019-11-15 NOTE — Telephone Encounter (Signed)
Scheduled appointment per 11/8 los. Spoke to patient who is aware of appointment date and time. Gave patient calendar print out.

## 2019-11-15 NOTE — Progress Notes (Signed)
[facility] CONSULT NOTE  Patient Care Team: Seward Carol, MD as PCP - General (Internal Medicine) Pieter Partridge, DO as Consulting Physician (Neurology)  CHIEF COMPLAINTS/PURPOSE OF CONSULTATION:  Newly diagnosed high risk for breast cancer  HISTORY OF PRESENTING ILLNESS:  Ann Weiss 69 y.o. female is here because of recent diagnosis of high risk for breast cancer. Screening mammogram on 05/05/19 showed right breast calcifications. Diagnostic mammogram on 05/14/19 showed right breast calcifications spanning 0.8cm. Biopsy on 05/26/19 showed atypical ductal hyperplasia. She underwent a right lumpectomy on 08/27/19 with Dr. Marlou Starks for which pathology showed hyalinized intraductal papilloma. She presents to the clinic today for initial evaluation to discuss surveillance options.   I reviewed her records extensively and collaborated the history with the patient.   MEDICAL HISTORY:  Past Medical History:  Diagnosis Date  . Breast mass, right   . Constipation   . Diabetes mellitus   . GERD (gastroesophageal reflux disease)   . Goiter   . Hyperlipidemia   . Hypertension   . Hypothyroidism   . Premature menopause age 31  . Stroke ([place])    sl weakness on right side-uses cane to walk  . Vitamin D deficiency 2009    SURGICAL HISTORY: Past Surgical History:  Procedure Laterality Date  . BREAST LUMPECTOMY WITH RADIOACTIVE SEED LOCALIZATION Right 08/27/2019   Procedure: RIGHT BREAST LUMPECTOMY WITH RADIOACTIVE SEED LOCALIZATION;  Surgeon: Jovita Kussmaul, MD;  Location: [facility];  Service: General;  Laterality: Right;  . CESAREAN SECTION     x 2  . COLONOSCOPY  [id number]  . ESOPHAGOGASTRODUODENOSCOPY  [id number]  . FOOT SURGERY Right 01/2013   bone spur  . TUBAL LIGATION      SOCIAL HISTORY: Social History   Socioeconomic History  . Marital status: Married    Spouse name: Sonia Side  . Number of children: 2  . Years of education: Not on file  . Highest  education level: Bachelor's degree (e.g., BA, AB, BS)  Occupational History  . Occupation: Retired from [employer]: RETIRED  Tobacco Use  . Smoking status: Never Smoker  . Smokeless tobacco: Never Used  Vaping Use  . Vaping Use: Never used  Substance and Sexual Activity  . Alcohol use: Yes    Comment: socially maybe 1-2 times monthly  . Drug use: No  . Sexual activity: Not Currently  Other Topics Concern  . Not on file  Social History Narrative    Daughter lives in [place] and other daughter lives in [place], [place]. 4 grandchildren      Patient is right-handed. She lives with her husband in a two level home. She drinks 2 cups of coffee a day, she does not exercise..   Social Determinants of Health   Financial Resource Strain:   . Difficulty of Paying Living Expenses: Not on file  Food Insecurity:   . Worried About Charity fundraiser in the Last Year: Not on file  . Ran Out of Food in the Last Year: Not on file  Transportation Needs:   . Lack of Transportation (Medical): Not on file  . Lack of Transportation (Non-Medical): Not on file  Physical Activity:   . Days of Exercise per Week: Not on file  . Minutes of Exercise per Session: Not on file  Stress:   . Feeling of Stress : Not on file  Social Connections:   . Frequency of Communication with Friends and Family: Not on file  .  Frequency of Social Gatherings with Friends and Family: Not on file  . Attends Religious Services: Not on file  . Active Member of Clubs or Organizations: Not on file  . Attends Archivist Meetings: Not on file  . Marital Status: Not on file  Intimate Partner Violence:   . Fear of Current or Ex-Partner: Not on file  . Emotionally Abused: Not on file  . Physically Abused: Not on file  . Sexually Abused: Not on file    FAMILY HISTORY: Family History  Problem Relation Age of Onset  . Hypertension Mother   . Kidney disease Mother        renal failure, dialysis  . Heart disease Mother     . Hypertension Father   . Stroke Father   . Heart disease Father   . Arthritis Maternal Aunt   . Heart disease Maternal Aunt   . Diabetes Maternal Grandmother   . Cancer Neg Hx   . Breast cancer Neg Hx   . Colon cancer Neg Hx     ALLERGIES:  is allergic to ciprofloxacin.  MEDICATIONS:  Current Outpatient Medications  Medication Sig Dispense Refill  . amLODipine (NORVASC) 10 MG tablet Take 1 tablet (10 mg total) by mouth daily. 30 tablet 5  . aspirin EC 81 MG tablet Take 81 mg by mouth daily.    Marland Kitchen BAYER CONTOUR TEST test strip TEST BLOOD SUGARS 3 TIMES DAILY 100 each 2  . hydrALAZINE (APRESOLINE) 50 MG tablet Take 50 mg by mouth 2 (two) times daily.     Marland Kitchen HYDROcodone-acetaminophen (NORCO/VICODIN) 5-325 MG tablet Take 1-2 tablets by mouth every 6 (six) hours as needed for moderate pain or severe pain. 10 tablet 0  . lactulose (CHRONULAC) 10 GM/15ML solution Take 10 g by mouth 2 (two) times daily.    Marland Kitchen levothyroxine (SYNTHROID, LEVOTHROID) 75 MCG tablet Take 75 mcg by mouth daily.  3  . metFORMIN (GLUCOPHAGE) 1000 MG tablet take 1 tablet by mouth twice a day with food 60 tablet 5  . omeprazole (PRILOSEC) 20 MG capsule take 1 capsule by mouth once daily (Patient taking differently: take 1 capsule by mouth as needed) 30 capsule 11  . rosuvastatin (CRESTOR) 40 MG tablet Take 1 tablet (40 mg total) by mouth daily. 90 tablet 1  . telmisartan-hydrochlorothiazide (MICARDIS HCT) 80-25 MG tablet Take 1 tablet by mouth daily.  6  . vitamin C (ASCORBIC ACID) 250 MG tablet Take 250 mg by mouth daily.     No current facility-administered medications for this visit.    REVIEW OF SYSTEMS:     All other systems were reviewed with the patient and are negative.  PHYSICAL EXAMINATION: ECOG PERFORMANCE STATUS: 1 - Symptomatic but completely ambulatory  Vitals:   11/15/19 1253  BP: (!) 157/70  Pulse: 72  Resp: 18  Temp: 98.3 F (36.8 C)  SpO2: 99%   Filed Weights   11/15/19 1253  Weight:  198 lb 11.2 oz (90.1 kg)     LABORATORY DATA:  I have reviewed the data as listed Lab Results  Component Value Date   WBC 9.2 04/01/2019   HGB 13.4 04/01/2019   HCT 41.7 04/01/2019   MCV 91.4 04/01/2019   PLT 198 04/01/2019   Lab Results  Component Value Date   NA 138 08/24/2019   K 3.9 08/24/2019   CL 101 08/24/2019   CO2 27 08/24/2019    RADIOGRAPHIC STUDIES: I have personally reviewed the radiological reports and agreed with  the findings in the report.  ASSESSMENT AND PLAN:  Intraductal papilloma of right breast 05/26/2019: Right breast biopsy LOQ: Hyalinized intraductal papilloma with calcifications, atypical ductal hyperplasia. 08/27/2019: Right lumpectomy: Hyalinized intraductal papilloma.  Usual ductal epithelial hyperplasia.  PASH: Pseudo-angiomatous stromal hyperplasia is a benign proliferation of the stroma that appears to be like a vascular lesion. It may present as a mass or thickening on exam and commonly found on mammograms as a well-defined mass. Excisional biopsy should be performed for most of these cases. However if it was diagnosed without any mass then surgical excision is not necessary. There is no increased risk of subsequent breast cancer associated with PASH.  Tyrer Cusick risk: 1.  10-year risk: 13.5% (average of 3.5%) 2. lifetime risk: 20.5% (average risk 6%)  Breast cancer surveillance: 1.  Annual mammograms 2. breast MRIs periodically once every 3 years or so.  Next breast MRI will be done in October 2022  Breast reduction: I discussed with her about pharmacologic and nonpharmacologic risk reduction measures.  Based upon her history of stroke, I do not recommend antiestrogen therapy with tamoxifen. She will exercise regularly eat more fruits and vegetables avoid alcohol intake and continue to take vitamin D and consider turmeric supplement.  Return to clinic in 1 year for follow-up    All questions were answered. The patient knows to call the  clinic with any problems, questions or concerns.   Rulon Eisenmenger, MD, MPH 11/15/2019    I, Molly Dorshimer, am acting as scribe for Nicholas Lose, MD.  I have reviewed the above documentation for accuracy and completeness, and I agree with the above.

## 2020-01-27 ENCOUNTER — Other Ambulatory Visit: Payer: No Typology Code available for payment source

## 2020-01-29 NOTE — Progress Notes (Signed)
Cardiology Office Note:   Date:  02/01/2020  NAME:  Ann Weiss    MRN: [id number] DOB:  04/03/1950   PCP:  Seward Carol, MD  Cardiologist:  No primary care provider on file.   Referring MD: Seward Carol, MD   Chief Complaint  Patient presents with  . Hyperlipidemia   History of Present Illness:   Ann Weiss is a 70 y.o. female with a hx of CVA, HTN, HLD who presents for follow-up. HLD needs evaluation.  Most recent lipid profile from primary care physician shows LDL cholesterol 195.  She reports she is taking her Crestor infrequently.  She may take it 1 time per week.  We did go over the importance of this.  She has had a stroke in the past and is diabetic.  Her LDL should be less than 70.  She does have high HDL which is likely protective for her but LDL still too high.  BP much better today 136/72.  Her palpitations have resolved.  Last year when we evaluate her palpitation she was going through a lot of stress.  Her stepdaughter died suddenly.  This was great stress to her.  Symptoms have improved with stress reduction strategies.  She had an echocardiogram 2018 that was normal as well.  She overall denies any symptoms of chest pain, shortness of breath or palpitations.  She is not exercising regularly but has no limitations.  Problem List 1. CVA 2019 2. DM -A1c 7.3 3. HTN 4. HLD -T chol 285, HDL 71, LDL 195, triglycerides 109 5. Palpitations  -normal zio 05/14/2019  Past Medical History: Past Medical History:  Diagnosis Date  . Breast mass, right   . Constipation   . Diabetes mellitus   . GERD (gastroesophageal reflux disease)   . Goiter   . Hyperlipidemia   . Hypertension   . Hypothyroidism   . Premature menopause age 47  . Stroke ([place])    sl weakness on right side-uses cane to walk  . Vitamin D deficiency 2009   Past Surgical History: Past Surgical History:  Procedure Laterality Date  . BREAST LUMPECTOMY WITH RADIOACTIVE SEED LOCALIZATION Right  08/27/2019   Procedure: RIGHT BREAST LUMPECTOMY WITH RADIOACTIVE SEED LOCALIZATION;  Surgeon: Jovita Kussmaul, MD;  Location: [facility];  Service: General;  Laterality: Right;  . CESAREAN SECTION     x 2  . COLONOSCOPY  [id number]  . ESOPHAGOGASTRODUODENOSCOPY  [id number]  . FOOT SURGERY Right 01/2013   bone spur  . TUBAL LIGATION      Current Medications: Current Meds  Medication Sig  . amLODipine (NORVASC) 10 MG tablet Take 1 tablet (10 mg total) by mouth daily.  Marland Kitchen aspirin EC 81 MG tablet Take 81 mg by mouth as needed.  Marland Kitchen BAYER CONTOUR TEST test strip TEST BLOOD SUGARS 3 TIMES DAILY  . hydrALAZINE (APRESOLINE) 50 MG tablet Take 50 mg by mouth 2 (two) times daily.   Marland Kitchen lactulose (CHRONULAC) 10 GM/15ML solution Take 10 g by mouth daily as needed.  Marland Kitchen levothyroxine (SYNTHROID, LEVOTHROID) 75 MCG tablet Take 75 mcg by mouth daily.  . metFORMIN (GLUCOPHAGE) 1000 MG tablet take 1 tablet by mouth twice a day with food  . omeprazole (PRILOSEC) 20 MG capsule take 1 capsule by mouth once daily (Patient taking differently: take 1 capsule by mouth as needed)  . rosuvastatin (CRESTOR) 40 MG tablet Take 1 tablet (40 mg total) by mouth daily.  Marland Kitchen telmisartan-hydrochlorothiazide (MICARDIS HCT) 80-25 MG tablet  Take 1 tablet by mouth daily.    Allergies:    Ciprofloxacin   Social History: Social History   Socioeconomic History  . Marital status: Married    Spouse name: Sonia Side  . Number of children: 2  . Years of education: Not on file  . Highest education level: Bachelor's degree (e.g., BA, AB, BS)  Occupational History  . Occupation: Retired from [employer]: RETIRED  Tobacco Use  . Smoking status: Never Smoker  . Smokeless tobacco: Never Used  Vaping Use  . Vaping Use: Never used  Substance and Sexual Activity  . Alcohol use: Yes    Comment: socially maybe 1-2 times monthly  . Drug use: No  . Sexual activity: Not Currently  Other Topics Concern  . Not on file  Social  History Narrative    Daughter lives in [place] and other daughter lives in [place], [place]. 4 grandchildren      Patient is right-handed. She lives with her husband in a two level home. She drinks 2 cups of coffee a day, she does not exercise..   Social Determinants of Health   Financial Resource Strain: Not on file  Food Insecurity: Not on file  Transportation Needs: Not on file  Physical Activity: Not on file  Stress: Not on file  Social Connections: Not on file    Family History: The patient's family history includes Arthritis in her maternal aunt; Diabetes in her maternal grandmother; Heart disease in her father, maternal aunt, and mother; Hypertension in her father and mother; Kidney disease in her mother; Stroke in her father. There is no history of Cancer, Breast cancer, or Colon cancer.  ROS:   All other ROS reviewed and negative. Pertinent positives noted in the HPI.     EKGs/Labs/Other Studies Reviewed:   The following studies were personally reviewed by me today:  Zio 05/14/2019 1. No significant arrhythmia detected.  2. Rare ectopy.    TTE 11/13/2017 - Left ventricle: The cavity size was normal. Wall thickness was  increased in a pattern of mild LVH. Systolic function was normal.  The estimated ejection fraction was in the range of 55% to 65%.   Cartoid US IMPRESSION: Less than 50% stenosis in the right and left internal carotid arteries.  Recent Labs: 04/01/2019: ALT 12; Hemoglobin 13.4; Platelets 198 04/30/2019: TSH 4.500 08/24/2019: BUN 13; Creatinine, Ser 1.07; Potassium 3.9; Sodium 138   Recent Lipid Panel    Component Value Date/Time   CHOL 253 (H) 08/02/2019 1115   TRIG 107 08/02/2019 1115   HDL 66 08/02/2019 1115   CHOLHDL 3.8 08/02/2019 1115   CHOLHDL 3.0 11/13/2017 0312   VLDL 17 11/13/2017 0312   LDLCALC 168 (H) 08/02/2019 1115    Physical Exam:   VS:  BP 136/72   Pulse 90   Ht 5\' 6"  (1.676 m)   Wt 187 lb 6.4 oz (85 kg)   SpO2 99%   BMI 30.25  kg/m    Wt Readings from Last 3 Encounters:  02/01/20 187 lb 6.4 oz (85 kg)  11/15/19 198 lb 11.2 oz (90.1 kg)  08/27/19 188 lb 0.8 oz (85.3 kg)    General: Well nourished, well developed, in no acute distress Head: Atraumatic, normal size  Eyes: PEERLA, EOMI  Neck: Supple, no JVD Endocrine: No thryomegaly Cardiac: Normal S1, S2; RRR; no murmurs, rubs, or gallops Lungs: Clear to auscultation bilaterally, no wheezing, rhonchi or rales  Abd: Soft, nontender, no hepatomegaly  Ext: No edema,  pulses 2+ Musculoskeletal: No deformities, BUE and BLE strength normal and equal Skin: Warm and dry, no rashes   Neuro: Alert and oriented to person, place, time, and situation, CNII-XII grossly intact, no focal deficits  Psych: Normal mood and affect   ASSESSMENT:   Gola Bribiesca is a 70 y.o. female who presents for the following: 1. Mixed hyperlipidemia   2. Essential hypertension   3. Palpitations     PLAN:   1. Mixed hyperlipidemia -Prior stroke.  She is diabetic.  She also has less than 50% carotid artery stenoses bilaterally.  LDL should be less than 70.  She takes an aspirin 81 mg a day.  She is taking her Crestor infrequently.  I think she is taking it at all.  I recommend she get back on her Crestor 40 mg a day.  We will plan to review her lipid profile yearly with her.  She will obtain labs to her primary care physician.  Goal LDL cholesterol is less than 70.  2. Essential hypertension -BP much improved.  Continue current medications.  3. Palpitations -Symptoms have improved and resolved.  Likely are stress related.  Normal monitor.  Normal echo in 2019.  Disposition: Return in about 1 year (around 01/31/2021).  Medication Adjustments/Labs and Tests Ordered: Current medicines are reviewed at length with the patient today.  Concerns regarding medicines are outlined above.  No orders of the defined types were placed in this encounter.  No orders of the defined types were placed  in this encounter.  Patient Instructions  Medication Instructions:  The current medical regimen is effective;  continue present plan and medications.  *If you need a refill on your cardiac medications before your next appointment, please call your pharmacy*   Follow-Up: At [facility], you and your health needs are our priority.  As part of our continuing mission to provide you with exceptional heart care, we have created designated Provider Care Teams.  These Care Teams include your primary Cardiologist (physician) and Advanced Practice Providers (APPs -  Physician Assistants and Nurse Practitioners) who all work together to provide you with the care you need, when you need it.  We recommend signing up for the patient portal called "MyChart".  Sign up information is provided on this After Visit Summary.  MyChart is used to connect with patients for Virtual Visits (Telemedicine).  Patients are able to view lab/test results, encounter notes, upcoming appointments, etc.  Non-urgent messages can be sent to your provider as well.   To learn more about what you can do with MyChart, go to [email or web address].    Your next appointment:   12 month(s)  The format for your next appointment:   In Person  Provider:   Eleonore Chiquito, MD        Time Spent with Patient: I have spent a total of 25 minutes with patient reviewing hospital notes, telemetry, EKGs, labs and examining the patient as well as establishing an assessment and plan that was discussed with the patient.  > 50% of time was spent in direct patient care.  Signed, Addison Naegeli. Audie Box, [place]  [address], [place], [place] [address] [phone]  02/01/2020 11:56 AM

## 2020-02-01 ENCOUNTER — Other Ambulatory Visit: Payer: Self-pay

## 2020-02-01 ENCOUNTER — Encounter: Payer: Self-pay | Admitting: Cardiovascular Disease

## 2020-02-01 ENCOUNTER — Ambulatory Visit (INDEPENDENT_AMBULATORY_CARE_PROVIDER_SITE_OTHER): Payer: No Typology Code available for payment source | Admitting: Cardiovascular Disease

## 2020-02-01 VITALS — BP 136/72 | HR 90 | Ht 66.0 in | Wt 187.4 lb

## 2020-02-01 DIAGNOSIS — I10 Essential (primary) hypertension: Secondary | ICD-10-CM | POA: Diagnosis not present

## 2020-02-01 DIAGNOSIS — E782 Mixed hyperlipidemia: Secondary | ICD-10-CM | POA: Diagnosis not present

## 2020-02-01 DIAGNOSIS — R002 Palpitations: Secondary | ICD-10-CM

## 2020-02-01 NOTE — Patient Instructions (Signed)

## 2020-03-20 ENCOUNTER — Other Ambulatory Visit: Payer: Self-pay | Admitting: Internal Medicine

## 2020-03-20 DIAGNOSIS — Z1231 Encounter for screening mammogram for malignant neoplasm of breast: Secondary | ICD-10-CM

## 2020-05-22 ENCOUNTER — Ambulatory Visit
Admission: RE | Admit: 2020-05-22 | Discharge: 2020-05-22 | Disposition: A | Discharge status: Home / Self Care | Payer: No Typology Code available for payment source | Source: Ambulatory Visit | Attending: Internal Medicine | Admitting: Internal Medicine

## 2020-05-22 ENCOUNTER — Other Ambulatory Visit: Payer: Self-pay

## 2020-05-22 DIAGNOSIS — Z1231 Encounter for screening mammogram for malignant neoplasm of breast: Secondary | ICD-10-CM

## 2020-05-22 DIAGNOSIS — M8588 Other specified disorders of bone density and structure, other site: Secondary | ICD-10-CM

## 2020-05-22 IMAGING — MG MM DIGITAL SCREENING BILAT W/ TOMO AND CAD
8 series · 8 of 24 positions shown · non-contrast
Comparison: Previous exam(s).

CLINICAL DATA: Screening.

EXAM:
DIGITAL SCREENING BILATERAL MAMMOGRAM WITH TOMOSYNTHESIS AND CAD
TECHNIQUE: Bilateral screening digital craniocaudal and mediolateral oblique
mammograms were obtained. Bilateral screening digital breast
tomosynthesis was performed. The images were evaluated with
computer-aided detection.

[L MLO synth-2D]
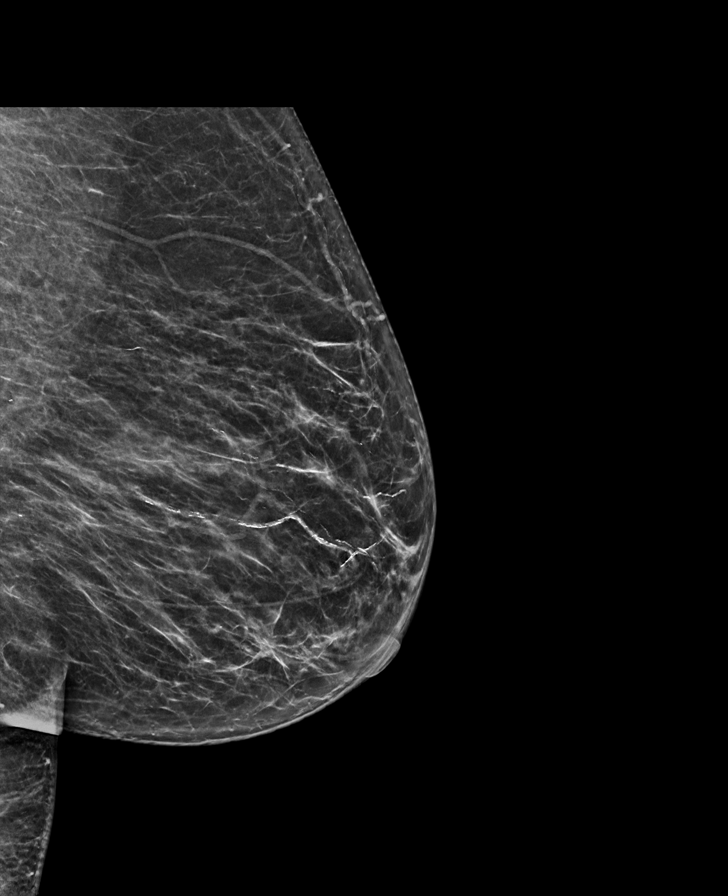

[L CC synth-2D]
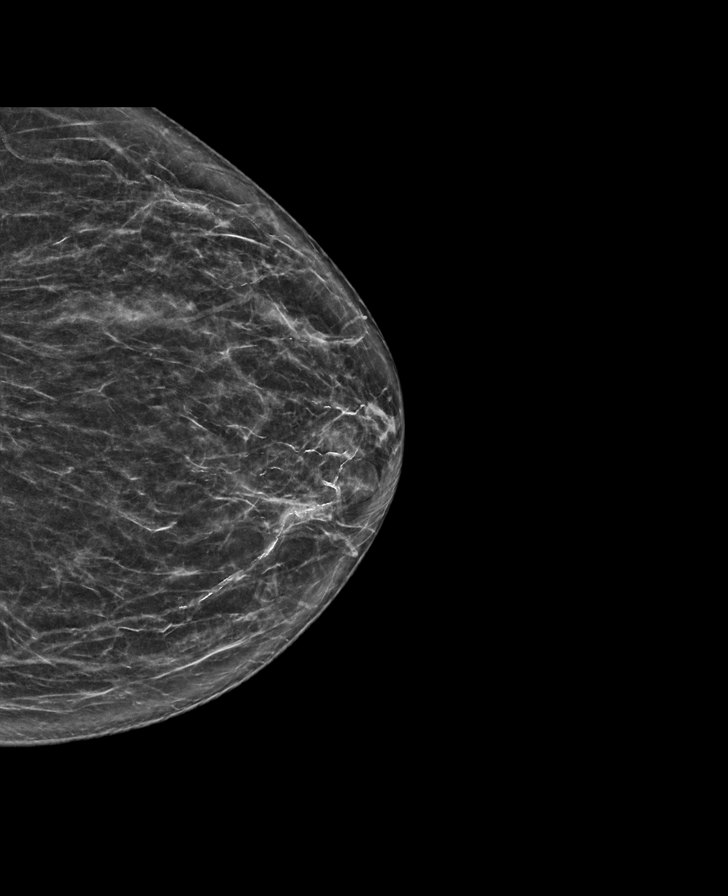

[R CC synth-2D]
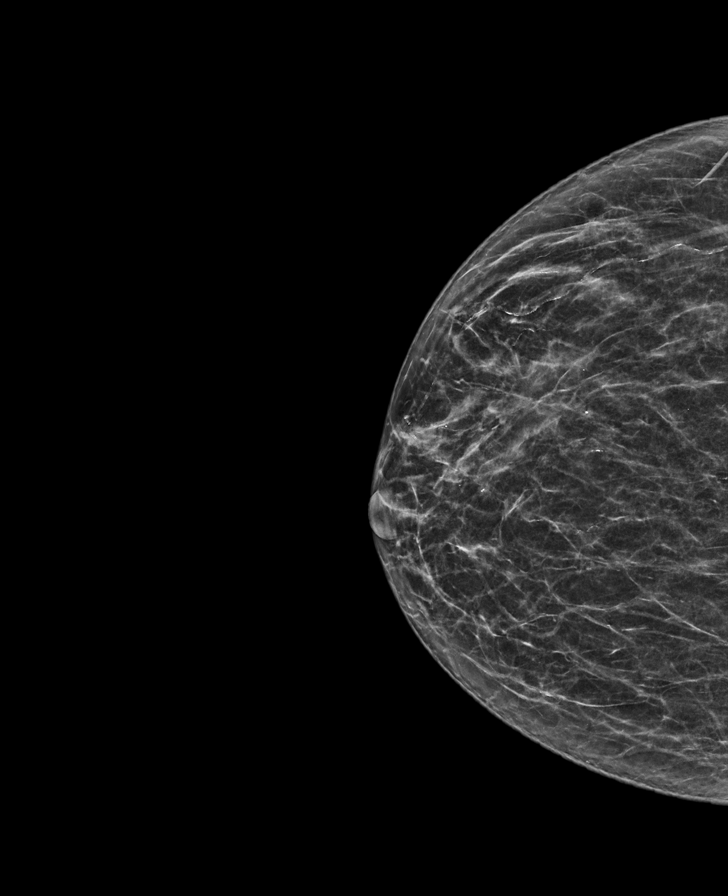

[R MLO synth-2D]
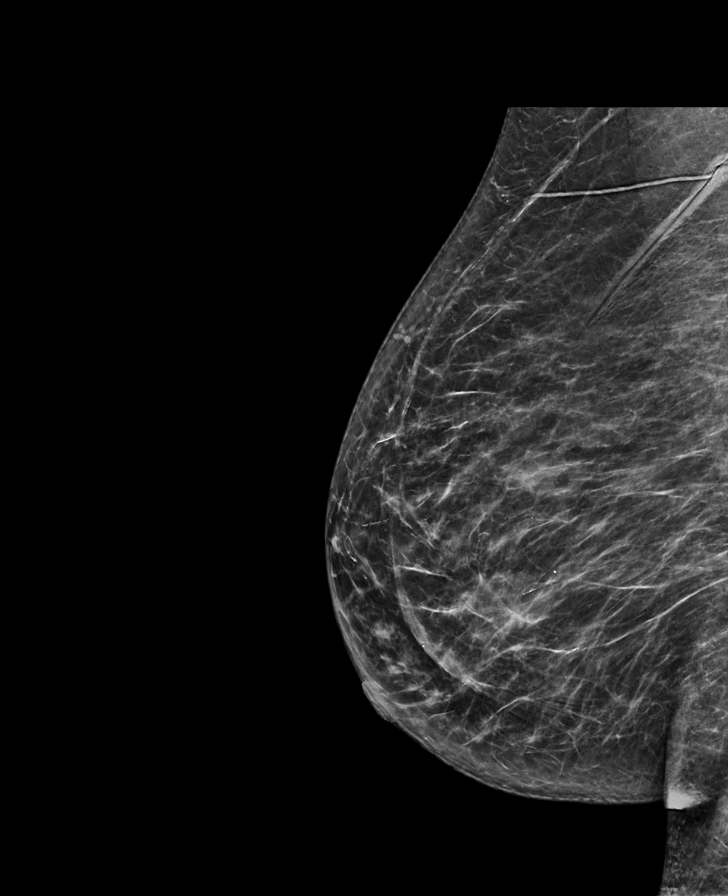

[L MLO tomo · tomo slice 33/64.0]
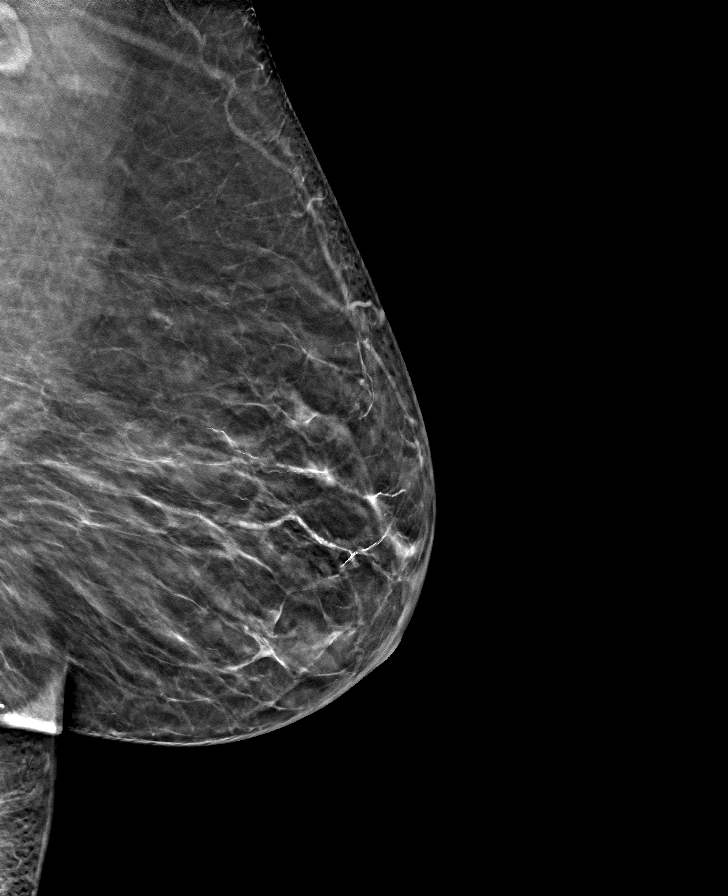

[L CC tomo · tomo slice 33/66.0]
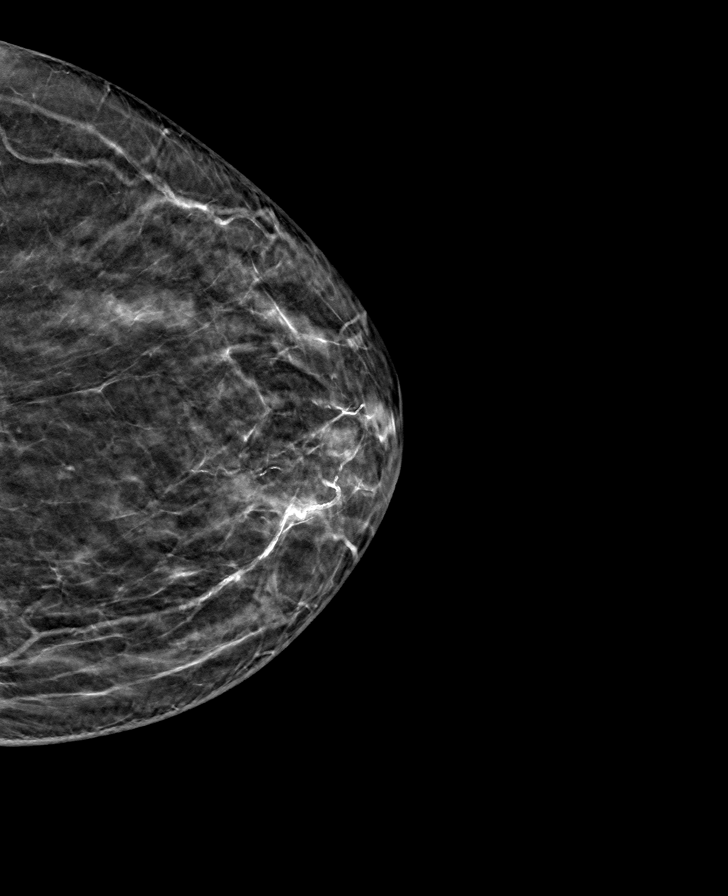

[R CC tomo · tomo slice 31/60.0]
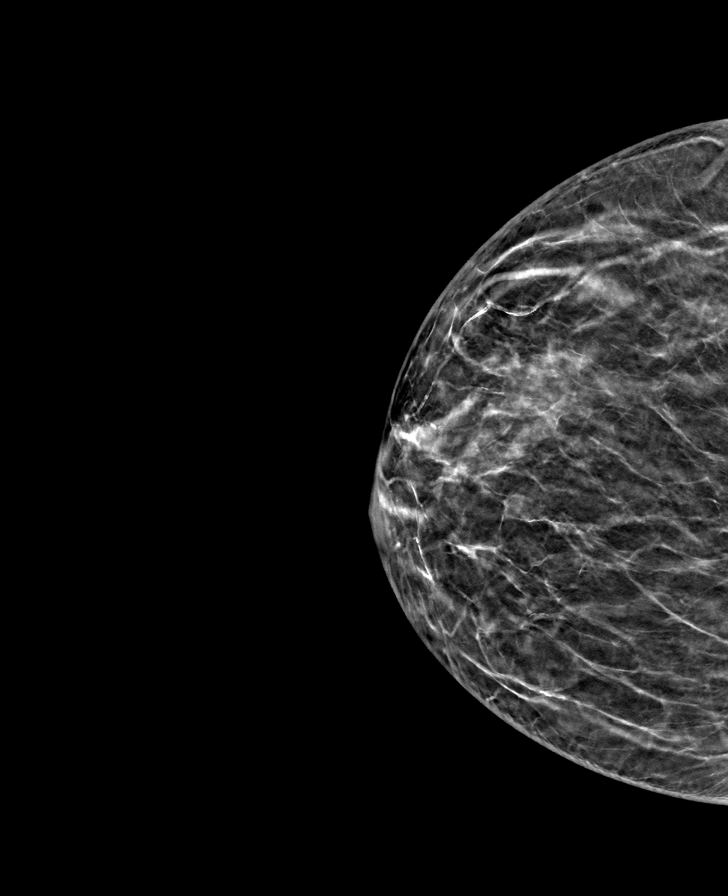

[R MLO tomo · tomo slice 34/67.0]
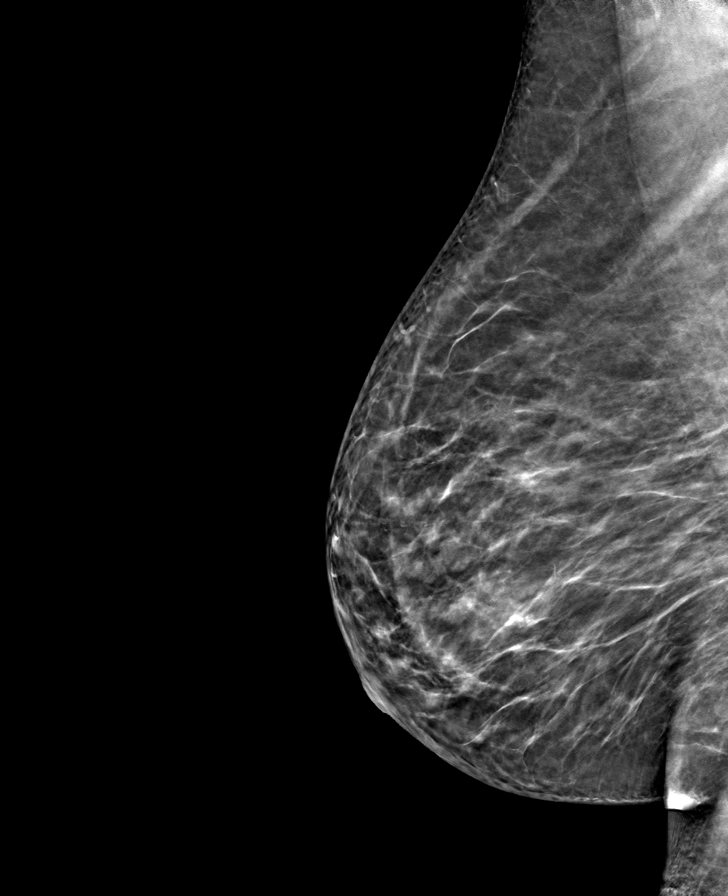

[8 of 24 positions shown; findings below may reference images not displayed]

ACR Breast Density Category b: There are scattered areas of
fibroglandular density.
FINDINGS: There are no findings suspicious for malignancy. The images were
evaluated with computer-aided detection.
IMPRESSION: Interval surgical scar at the location of the hyalinized intraductal
papilloma with calcifications resected in the interim in the lower
outer quadrant of the right breast. No mammographic evidence of
malignancy. A result letter of this screening mammogram will be
mailed directly to the patient.

RECOMMENDATION:
Screening mammogram in one year. (Code:DT-3-EXW)

BI-RADS CATEGORY  1: Negative.

## 2020-10-02 ENCOUNTER — Telehealth: Payer: Self-pay

## 2020-10-02 ENCOUNTER — Other Ambulatory Visit: Payer: Self-pay | Admitting: Hematology and Oncology

## 2020-10-02 MED ORDER — LORAZEPAM 0.5 MG PO TABS: 0.5000 mg | ORAL_TABLET | Freq: Once | ORAL | 0 refills | Status: AC

## 2020-10-02 NOTE — Progress Notes (Signed)
Claustrophobia for MRI: I sent the prescription for 2 tablets of lorazepam 0.5 mg.  She will take 1 tablet 1 hour before and another tablet if necessary before procedure.

## 2020-10-02 NOTE — Telephone Encounter (Signed)
Pt called and asks for anxiolytic d/t claustrophobia, has an upcoming MRI. Pt aware this has been given to MD. Verbalized thanks and understanding.

## 2020-10-03 ENCOUNTER — Other Ambulatory Visit: Payer: Self-pay | Admitting: *Deleted

## 2020-10-03 MED ORDER — LORAZEPAM 0.5 MG PO TABS: 0.5000 mg | ORAL_TABLET | Freq: Two times a day (BID) | ORAL | 0 refills | Status: AC

## 2020-10-03 NOTE — Progress Notes (Signed)
Per MD Patient to take 1 tablet Lorazepam (0.5 mg total) 1 hour prior to MRI and 1 tablet (0.5 mg total) immediately prior to MRI for claustrophobia.  Prescription sent to pharmacy on file, pt educated and verbalized understanding.

## 2020-10-10 ENCOUNTER — Other Ambulatory Visit: Payer: Self-pay

## 2020-10-10 ENCOUNTER — Ambulatory Visit
Admission: RE | Admit: 2020-10-10 | Discharge: 2020-10-10 | Disposition: A | Discharge status: Home / Self Care | Payer: No Typology Code available for payment source | Source: Ambulatory Visit | Attending: Hematology and Oncology | Admitting: Hematology and Oncology

## 2020-10-10 DIAGNOSIS — D241 Benign neoplasm of right breast: Secondary | ICD-10-CM

## 2020-10-10 DIAGNOSIS — Z9189 Other specified personal risk factors, not elsewhere classified: Secondary | ICD-10-CM

## 2020-10-10 IMAGING — MR MR BREAST BILAT WO/W CM
8 of 12 series · 33 of 48 positions shown · IV contrast (9ml gadavist)
Comparison: No previous breast MRI

CLINICAL DATA: Breast cancer screening, high risk, greater than 20%
lifetime risk.

Status post surgical excision on 08/27/2019 for RIGHT breast
papilloma and ADH.
LABS:  Not performed at [REDACTED].
EXAM:
BILATERAL BREAST MRI WITH AND WITHOUT CONTRAST
TECHNIQUE: Multiplanar, multisequence MR images of both breasts were obtained
prior to and following the intravenous administration of 9 ml of
Gadavist

[Series 2: t2_tirm_tra ipat (a-p) · axial · 3.0mm · 0.70mm/px · 1 of 55 slices shown]
[im 1/55]
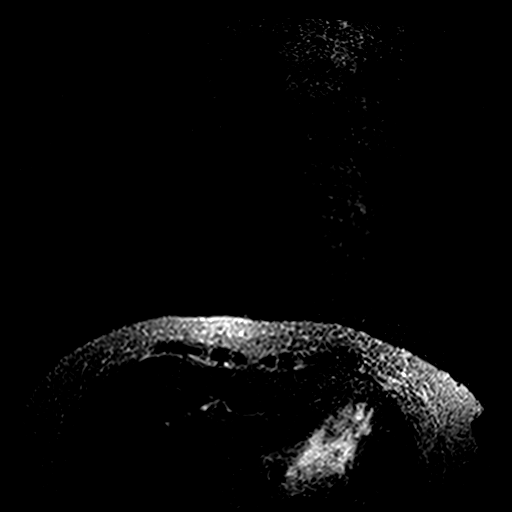

[Series 3: fl3d pre-cm no · axial · non-contrast · 1.2mm · 0.94mm/px · z∈[-49,+122]mm · 5 of 144 slices shown]
[im 1/144]
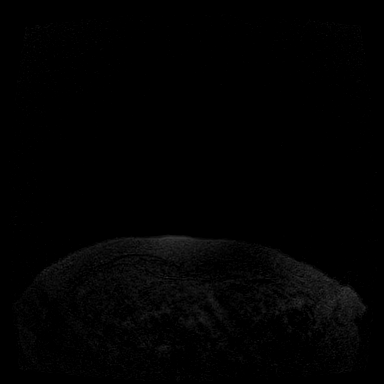
[im 36/144]
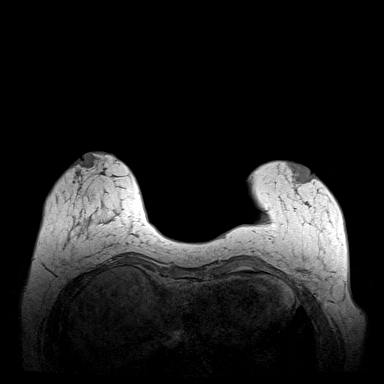
[im 72/144]
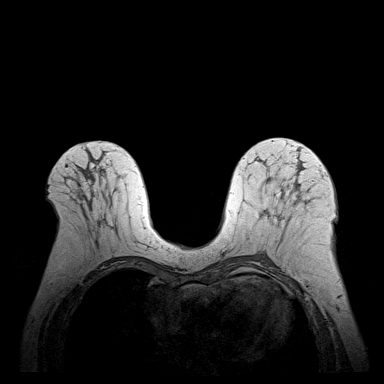
[im 108/144]
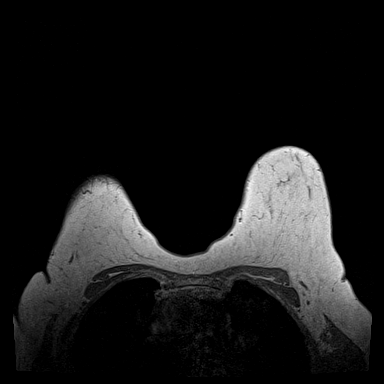
[im 144/144]
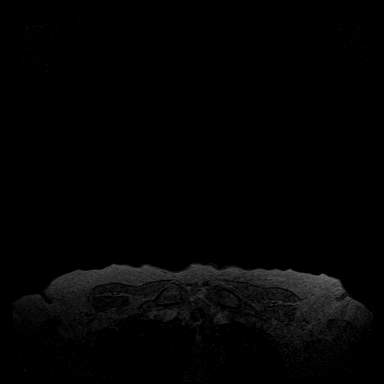

[Series 4: fl3d pre-cm · axial · non-contrast · 1.2mm · 0.94mm/px · z∈[-49,+122]mm · 5 of 144 slices shown]
[im 1/144]
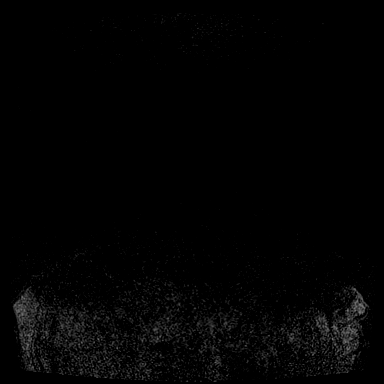
[im 36/144]
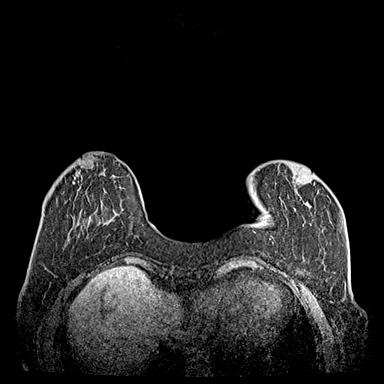
[im 72/144]
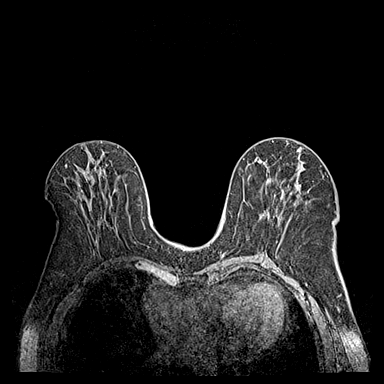
[im 108/144]
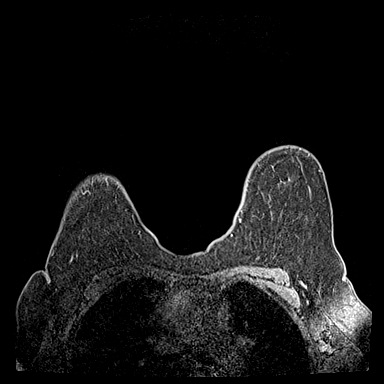
[im 144/144]
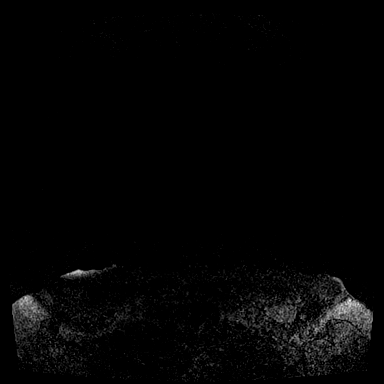

[Series 5: fl3d post-cm 20 · axial · 1.2mm · 0.94mm/px · z∈[-49,+122]mm · 5 of 144 slices shown (1 of 3)]
[im 1/144]
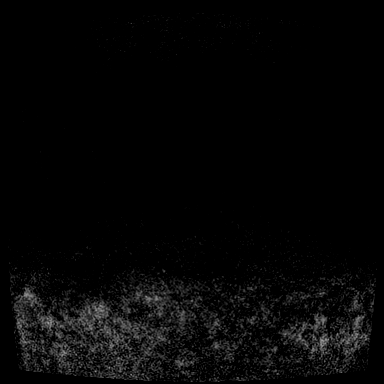
[im 36/144]
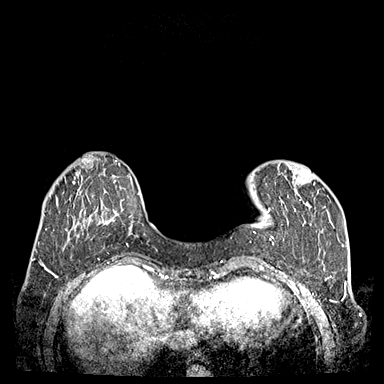
[im 72/144]
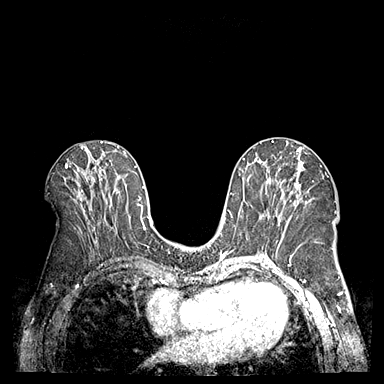
[im 108/144]
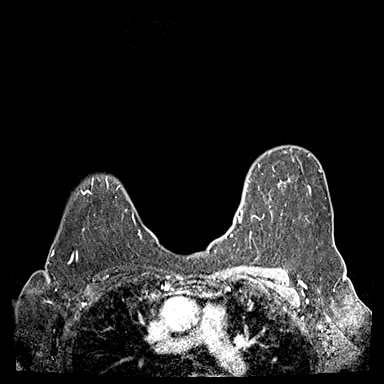
[im 144/144]
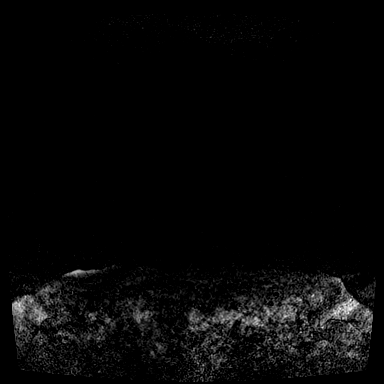

[Series 6: fl3d post-cm 20 · axial · 1.2mm · 0.94mm/px · z∈[-49,+122]mm · 5 of 144 slices shown (2 of 3)]
[im 1/144]
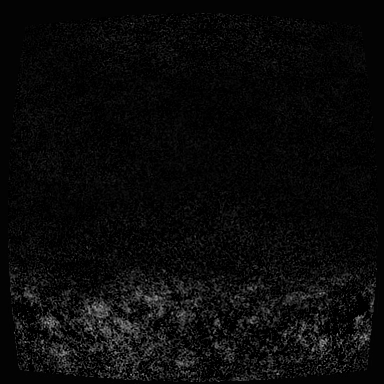
[im 36/144]
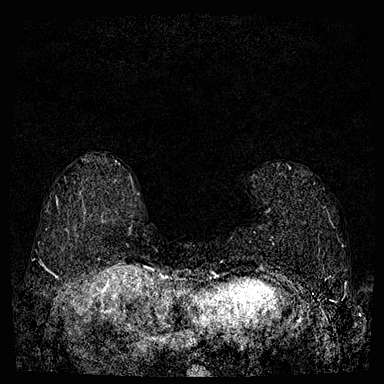
[im 72/144]
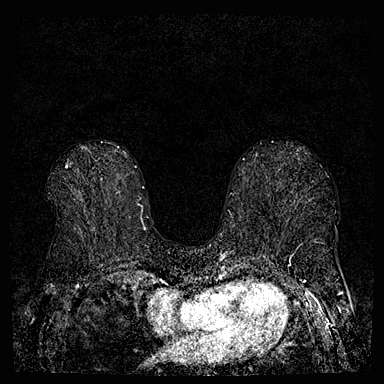
[im 108/144]
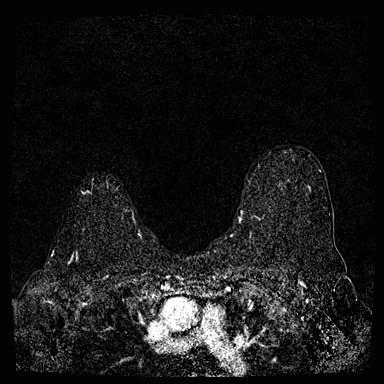
[im 144/144]
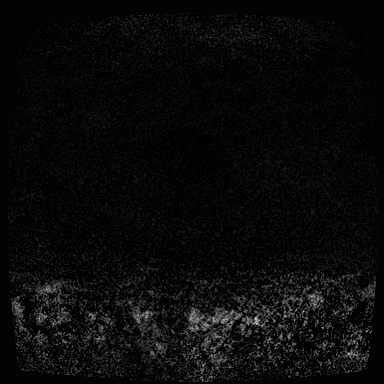

[Series 7: fl3d post-cm 20 · axial · 172.8mm · 0.94mm/px · 1 of 1 slices shown (3 of 3)]
[im 1/1]
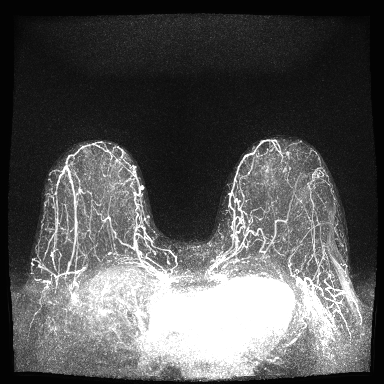

[Series 8: fl3d post-cm 3 · axial · 1.2mm · 0.94mm/px · z∈[-49,+122]mm · 6 of 144 slices shown (1 of 2)]
[im 1/144]
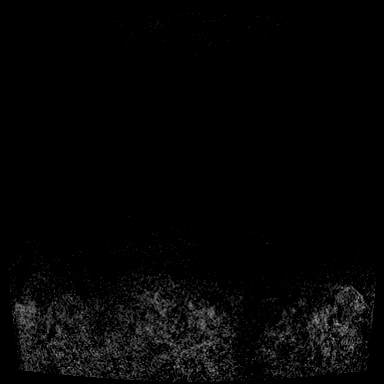
[im 29/144]
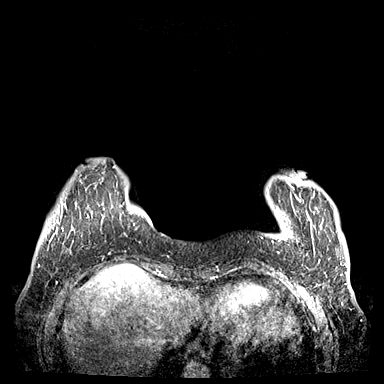
[im 58/144]
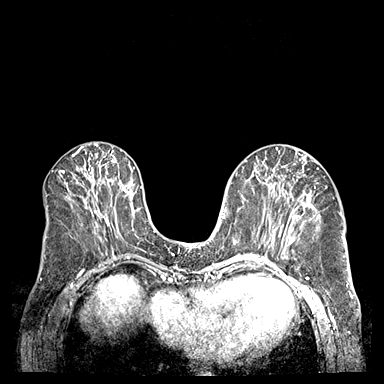
[im 86/144]
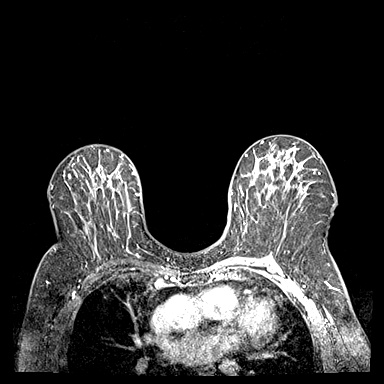
[im 115/144]
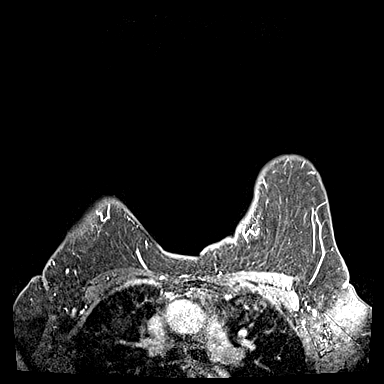
[im 144/144]
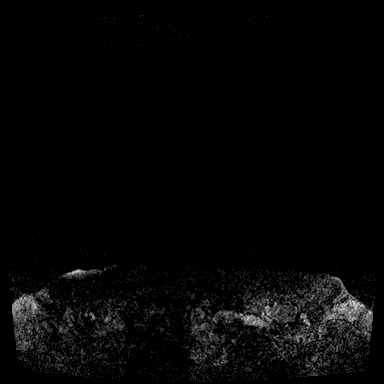

[Series 9: fl3d post-cm 3 · axial · 1.2mm · 0.94mm/px · z∈[-49,+87]mm · 5 of 144 slices shown (2 of 2)]
[im 1/144]
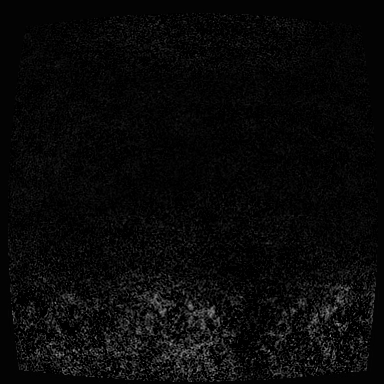
[im 29/144]
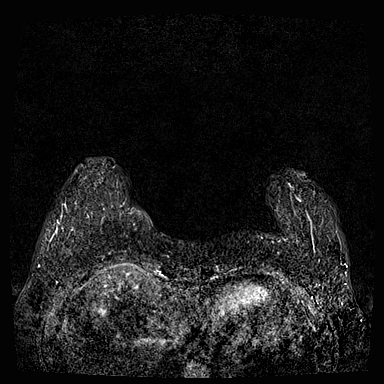
[im 58/144]
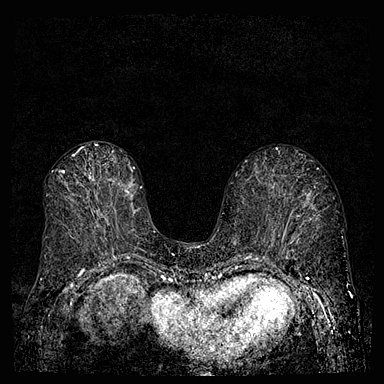
[im 86/144]
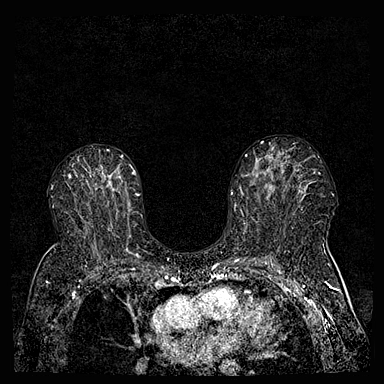
[im 115/144]
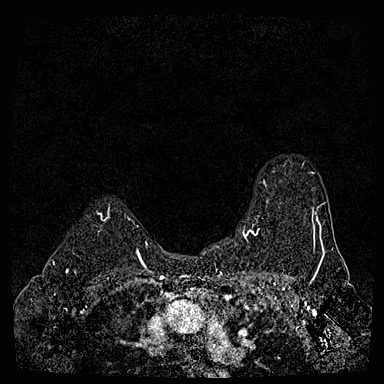

[33 of 48 positions shown; findings below may reference images not displayed]

Three-dimensional MR images were rendered by post-processing of the
original MR data on an independent workstation. The
three-dimensional MR images were interpreted, and findings are
reported in the following complete MRI report for this study. Three
dimensional images were evaluated at the independent interpreting
workstation using the DynaCAD thin client.
FINDINGS: Breast composition: b. Scattered fibroglandular tissue.

Background parenchymal enhancement: Moderate.

Right breast: There is no suspicious enhancing mass, suspicious
non-mass enhancement or secondary signs of malignancy. Expected
surgical changes of the RIGHT breast.

Left breast: There is no suspicious enhancing mass, suspicious
non-mass enhancement or secondary signs of malignancy.

Lymph nodes: No abnormal appearing lymph nodes.

Ancillary findings:  None.
IMPRESSION: No MRI evidence of malignancy within either breast.

RECOMMENDATION:
1. Annual screening mammograms. Next bilateral screening mammogram
will be due in Friday May, 2021.
2. Annual screening breast MRIs. Per American Cancer Society
guidelines, annual screening MRI of the breasts is recommended if a
risk assessment calculation for breast cancer, preferably using the
Tyrer-Cuzick or Gail model, measures greater than 20%.

BI-RADS CATEGORY  2: Benign.

## 2020-10-10 MED ORDER — GADOBUTROL 1 MMOL/ML IV SOLN
9.0000 mL | Freq: Once | INTRAVENOUS | Status: AC | PRN
  Administered 2020-10-10: 9 mL via INTRAVENOUS

## 2020-11-13 NOTE — Progress Notes (Signed)
Patient Care Team: Seward Carol, MD as PCP - General (Internal Medicine) Pieter Partridge, DO as Consulting Physician (Neurology)  DIAGNOSIS:    ICD-10-CM   1. Intraductal papilloma of right breast  D24.1       CHIEF COMPLIANT: Follow-up of high risk of breast cancer  INTERVAL HISTORY: Ann Weiss is a 70 y.o. with above-mentioned history of high risk of breast cancer. Mammogram on 05/22/2020 showed no evidence of malignancy. MRI Breast on 10/10/2020 showed no evidence of malignancy. She presents to the clinic today for follow-up.  She reports no new problems or concerns.  Denies symptoms or nodules in the breast.  ALLERGIES:  is allergic to ciprofloxacin.  MEDICATIONS:  Current Outpatient Medications  Medication Sig Dispense Refill   amLODipine (NORVASC) 10 MG tablet Take 1 tablet (10 mg total) by mouth daily. 30 tablet 5   aspirin EC 81 MG tablet Take 81 mg by mouth as needed.     BAYER CONTOUR TEST test strip TEST BLOOD SUGARS 3 TIMES DAILY 100 each 2   hydrALAZINE (APRESOLINE) 50 MG tablet Take 50 mg by mouth 2 (two) times daily.      lactulose (CHRONULAC) 10 GM/15ML solution Take 10 g by mouth daily as needed.     levothyroxine (SYNTHROID, LEVOTHROID) 75 MCG tablet Take 75 mcg by mouth daily.  3   metFORMIN (GLUCOPHAGE) 1000 MG tablet take 1 tablet by mouth twice a day with food 60 tablet 5   omeprazole (PRILOSEC) 20 MG capsule take 1 capsule by mouth once daily (Patient taking differently: take 1 capsule by mouth as needed) 30 capsule 11   rosuvastatin (CRESTOR) 40 MG tablet Take 1 tablet (40 mg total) by mouth daily. 90 tablet 1   telmisartan-hydrochlorothiazide (MICARDIS HCT) 80-25 MG tablet Take 1 tablet by mouth daily.  6   No current facility-administered medications for this visit.    PHYSICAL EXAMINATION: ECOG PERFORMANCE STATUS: 1 - Symptomatic but completely ambulatory  Vitals:   11/14/20 1336  BP: (!) 147/76  Pulse: 76  Resp: 18  Temp: 97.7 F (36.5  C)  SpO2: 100%   Filed Weights   11/14/20 1336  Weight: 191 lb 14.4 oz (87 kg)      LABORATORY DATA:  I have reviewed the data as listed CMP Latest Ref Rng & Units 08/24/2019 04/01/2019 07/29/2018  Glucose 70 - 99 mg/dL 291(H) 156(H) 184(H)  BUN 8 - 23 mg/dL 13 14 18   Creatinine 0.44 - 1.00 mg/dL 1.07(H) 0.89 0.94  Sodium 135 - 145 mmol/L 138 141 140  Potassium 3.5 - 5.1 mmol/L 3.9 3.6 3.9  Chloride 98 - 111 mmol/L 101 105 107  CO2 22 - 32 mmol/L 27 25 24   Calcium 8.9 - 10.3 mg/dL 9.4 9.5 9.3  Total Protein 6.5 - 8.1 g/dL - 7.5 -  Total Bilirubin 0.3 - 1.2 mg/dL - 0.8 -  Alkaline Phos 38 - 126 U/L - 55 -  AST 15 - 41 U/L - 18 -  ALT 0 - 44 U/L - 12 -    Lab Results  Component Value Date   WBC 9.2 04/01/2019   HGB 13.4 04/01/2019   HCT 41.7 04/01/2019   MCV 91.4 04/01/2019   PLT 198 04/01/2019   NEUTROABS 6.2 04/01/2019    ASSESSMENT & PLAN:  Intraductal papilloma of right breast 05/26/2019: Right breast biopsy LOQ: Hyalinized intraductal papilloma with calcifications, atypical ductal hyperplasia. 08/27/2019: Right lumpectomy: Hyalinized intraductal papilloma.  Usual ductal epithelial hyperplasia.  Tyrer Cusick risk: 1.  10-year risk: 13.5% (average of 3.5%) 2. lifetime risk: 20.5% (average risk 6%)   Breast cancer surveillance: 1.  Annual mammograms: May 2022: Benign 2. breast MRIs periodically once every 3 years or so.  Breast MRI at 10/10/2020: Benign breast density category B  Return to clinic in 2 years for follow-up      No orders of the defined types were placed in this encounter.  The patient has a good understanding of the overall plan. she agrees with it. she will call with any problems that may develop before the next visit here.  Total time spent: 20 mins including face to face time and time spent for planning, charting and coordination of care  Rulon Eisenmenger, MD, MPH 11/14/2020  I, Thana Ates, am acting as scribe for Dr. Nicholas Lose.  I have  reviewed the above documentation for accuracy and completeness, and I agree with the above.

## 2020-11-14 ENCOUNTER — Other Ambulatory Visit: Payer: Self-pay

## 2020-11-14 ENCOUNTER — Inpatient Hospital Stay
Payer: No Typology Code available for payment source | Attending: Hematology and Oncology | Admitting: Hematology and Oncology

## 2020-11-14 DIAGNOSIS — D241 Benign neoplasm of right breast: Secondary | ICD-10-CM | POA: Diagnosis present

## 2020-11-14 NOTE — Assessment & Plan Note (Signed)
05/26/2019: Right breast biopsy LOQ: Hyalinized intraductal papilloma with calcifications, atypical ductal hyperplasia. 08/27/2019: Right lumpectomy: Hyalinized intraductal papilloma.  Usual ductal epithelial hyperplasia.  Tyrer Cusick risk: 1.  10-year risk: 13.5% (average of 3.5%) 2. lifetime risk: 20.5% (average risk 6%)  Breast cancer surveillance: 1.  Annual mammograms 2. breast MRIs periodically once every 3 years or so.  Breast MRI at 10/10/2020: Benign breast density category B  Return to clinic in 1 year for follow-up

## 2021-01-24 ENCOUNTER — Encounter (HOSPITAL_COMMUNITY): Payer: Self-pay

## 2021-02-12 ENCOUNTER — Ambulatory Visit
Admission: RE | Admit: 2021-02-12 | Discharge: 2021-02-12 | Disposition: A | Discharge status: Home / Self Care | Payer: No Typology Code available for payment source | Source: Ambulatory Visit | Attending: Internal Medicine | Admitting: Internal Medicine

## 2021-02-12 ENCOUNTER — Other Ambulatory Visit: Payer: Self-pay | Admitting: Internal Medicine

## 2021-02-12 DIAGNOSIS — K6289 Other specified diseases of anus and rectum: Secondary | ICD-10-CM

## 2021-02-12 DIAGNOSIS — K5900 Constipation, unspecified: Secondary | ICD-10-CM

## 2021-02-12 IMAGING — CR DG ABDOMEN 2V
2 series · 2 of 2 positions shown · non-contrast
Comparison: 09/24/2018

CLINICAL DATA: Rectal pain, constipation

EXAM:
ABDOMEN - 2 VIEW

[w abdomen upright]
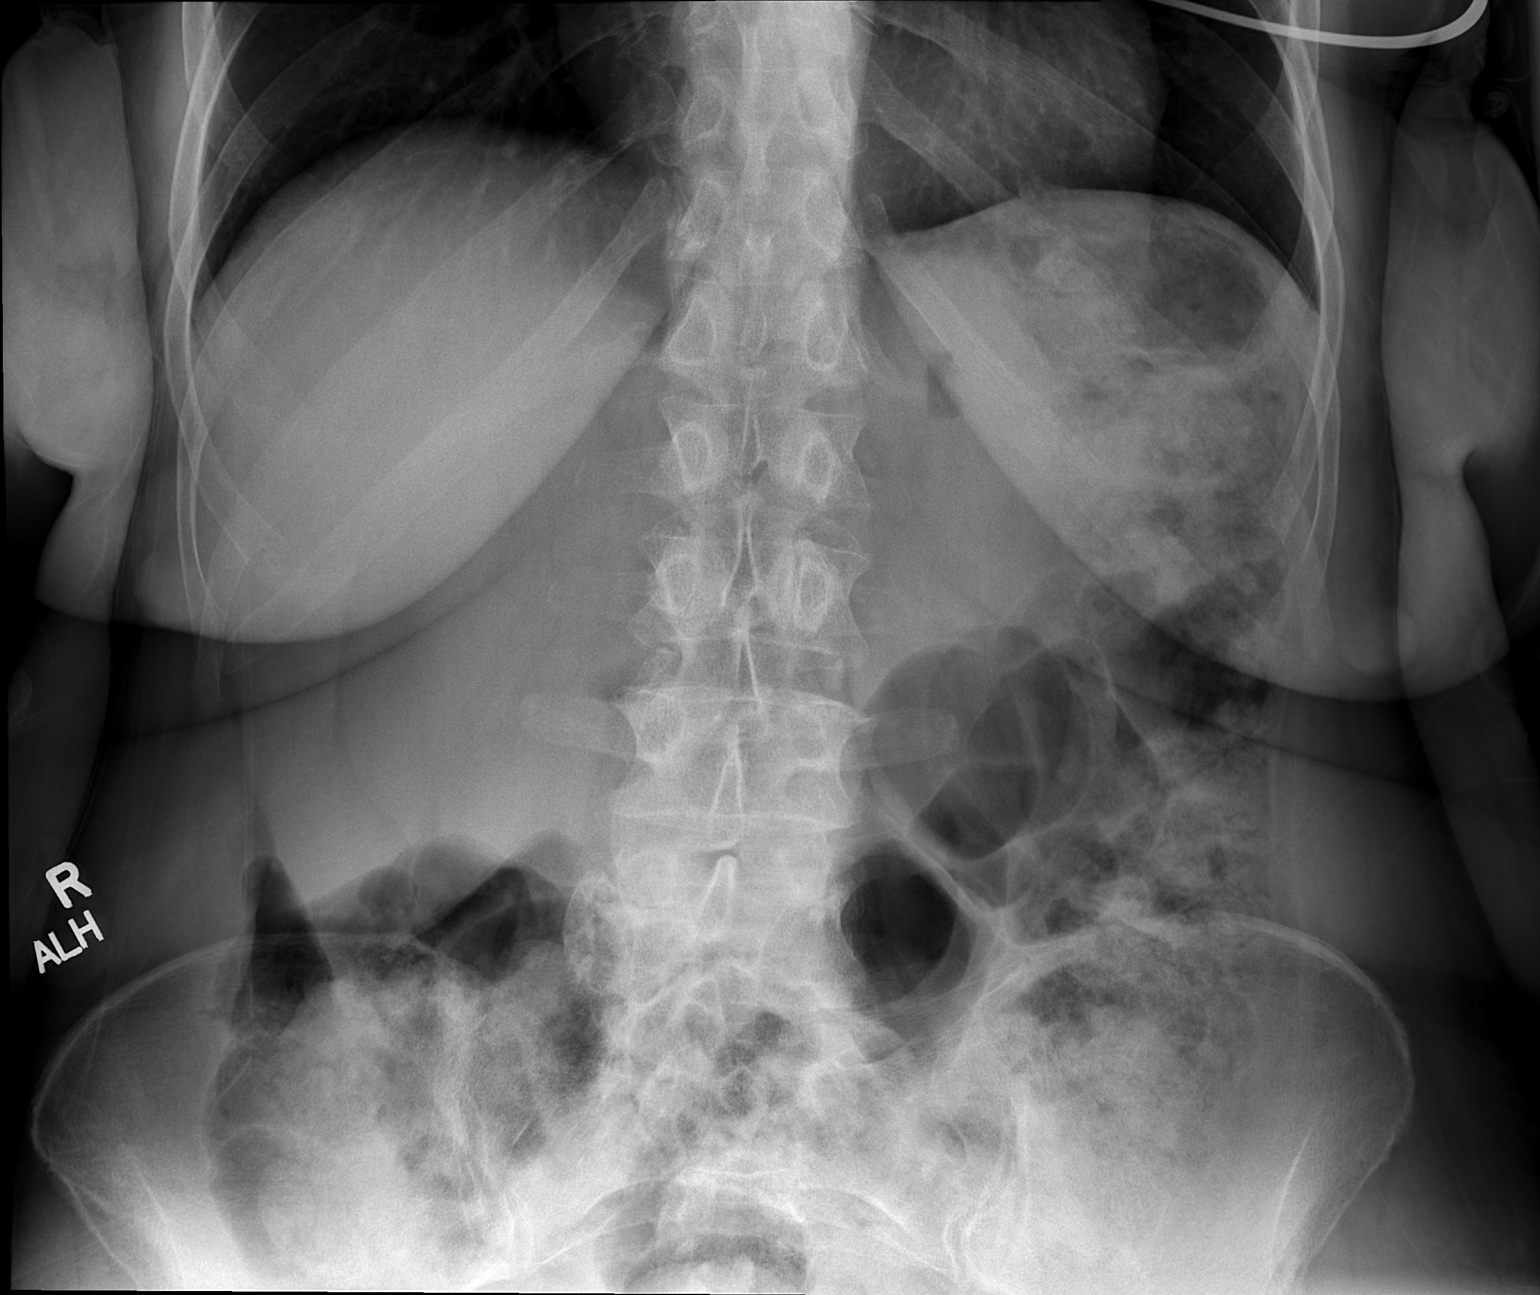

[t abdomen supine]
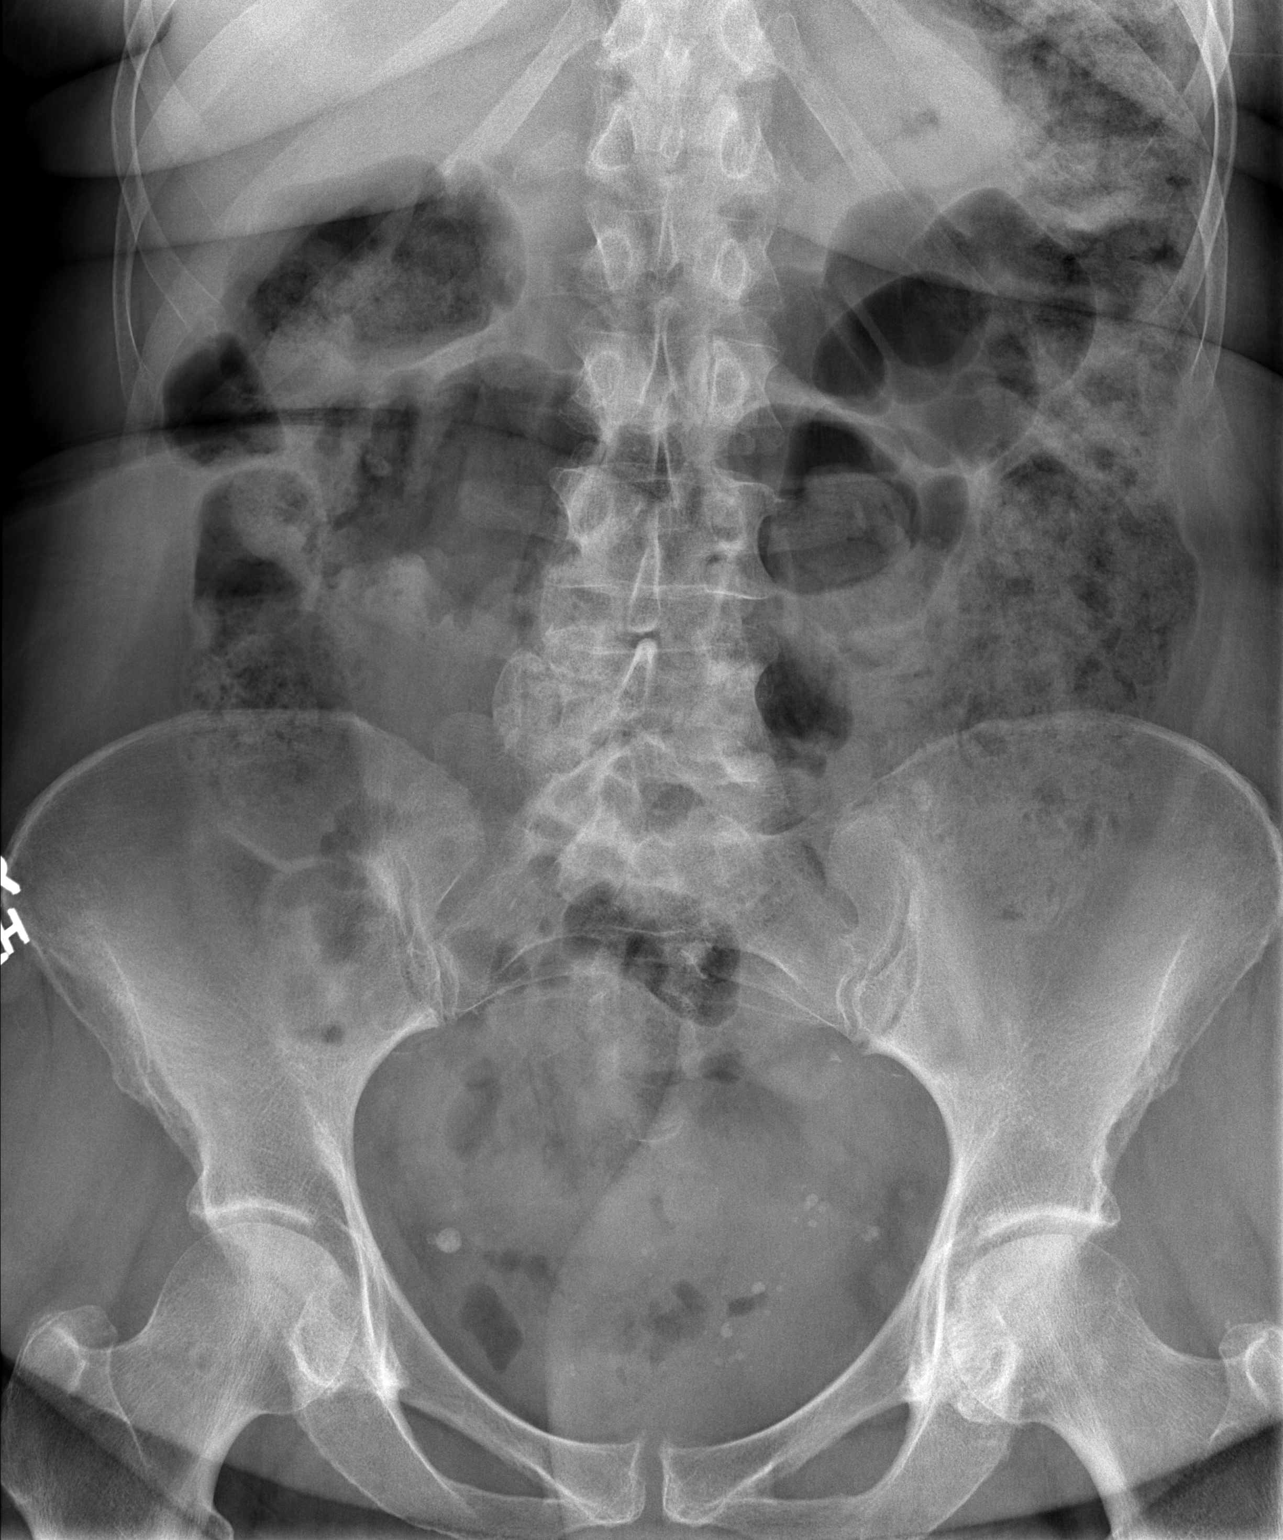

[2 of 2 positions shown; findings below may reference images not displayed]

FINDINGS: Bowel gas pattern is nonspecific. Large amount of stool is seen in
the colon, especially in the descending colon. There is no fecal
impaction in the rectosigmoid. Phleboliths are seen in the pelvis.
Kidneys are mostly obscured by bowel contents limiting evaluation
for renal stones. Visualized lower lung fields are clear.
Degenerative changes are noted in the lower lumbar spine.
IMPRESSION: Nonspecific bowel gas pattern. Large stool burden in the colon
without signs of fecal impaction in the rectosigmoid.

## 2021-05-31 ENCOUNTER — Other Ambulatory Visit: Payer: Self-pay | Admitting: Internal Medicine

## 2021-05-31 DIAGNOSIS — Z1231 Encounter for screening mammogram for malignant neoplasm of breast: Secondary | ICD-10-CM

## 2021-06-08 ENCOUNTER — Ambulatory Visit: Payer: No Typology Code available for payment source

## 2021-07-03 ENCOUNTER — Ambulatory Visit
Admission: RE | Admit: 2021-07-03 | Discharge: 2021-07-03 | Disposition: A | Discharge status: Home / Self Care | Payer: No Typology Code available for payment source | Source: Ambulatory Visit | Attending: Internal Medicine | Admitting: Internal Medicine

## 2021-07-03 DIAGNOSIS — Z1231 Encounter for screening mammogram for malignant neoplasm of breast: Secondary | ICD-10-CM

## 2022-02-27 ENCOUNTER — Other Ambulatory Visit: Payer: Self-pay | Admitting: Internal Medicine

## 2022-02-27 DIAGNOSIS — Z1231 Encounter for screening mammogram for malignant neoplasm of breast: Secondary | ICD-10-CM

## 2022-07-08 ENCOUNTER — Ambulatory Visit
Admission: RE | Admit: 2022-07-08 | Discharge: 2022-07-08 | Disposition: A | Discharge status: Home / Self Care | Payer: No Typology Code available for payment source | Source: Ambulatory Visit | Attending: Internal Medicine | Admitting: Internal Medicine

## 2022-07-08 DIAGNOSIS — Z1231 Encounter for screening mammogram for malignant neoplasm of breast: Secondary | ICD-10-CM

## 2022-08-28 ENCOUNTER — Ambulatory Visit (INDEPENDENT_AMBULATORY_CARE_PROVIDER_SITE_OTHER): Payer: Commercial Managed Care - PPO | Admitting: Neurology

## 2022-08-28 ENCOUNTER — Encounter: Payer: Self-pay | Admitting: Neurology

## 2022-08-28 VITALS — BP 130/75 | HR 65 | Ht 66.0 in | Wt 194.2 lb

## 2022-08-28 DIAGNOSIS — G930 Cerebral cysts: Secondary | ICD-10-CM | POA: Diagnosis not present

## 2022-08-28 DIAGNOSIS — M48061 Spinal stenosis, lumbar region without neurogenic claudication: Secondary | ICD-10-CM | POA: Diagnosis not present

## 2022-08-28 DIAGNOSIS — R29898 Other symptoms and signs involving the musculoskeletal system: Secondary | ICD-10-CM | POA: Diagnosis not present

## 2022-08-28 NOTE — Patient Instructions (Addendum)
I had a long discussion with the patient with regards to her longstanding right leg weakness as well as abnormal brain MRI scan as well as lumbar spinal stenosis and answered questions.  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  Recommend check repeat MRI scan of the brain with and without contrast and lumbar spine.  Continue aspirin for stroke prevention maintain aggressive risk factor modification.  Check lipid profile and hemoglobin A1c.  Maintain strict control of hypertension with blood pressure goal below 130/90, lipids with LDL cholesterol goal below 70 mg percent and diabetes with hemoglobin A1c goal below 6.5%.  Continue to use a cane for ambulation and we discussed fall safety precautions.  Return for follow-up in the future in 3 to 4 months or call earlier if necessary.  Fall Prevention in the Home, Adult Falls can cause injuries and affect people of all ages. There are many simple things that you can do to make your home safe and to help prevent falls. If you need it, ask for help making these changes. What actions can I take to prevent falls? General information Use good lighting in all rooms. Make sure to: Replace any light bulbs that burn out. Turn on lights if it is dark and use night-lights. Keep items that you use often in easy-to-reach places. Lower the shelves around your home if needed. Move furniture so that there are clear paths around it. Do not keep throw rugs or other things on the floor that can make you trip. If any of your floors are uneven, fix them. Add color or contrast paint or tape to clearly mark and help you see: Grab bars or handrails. First and last steps of staircases. Where the edge of each step is. If you use a ladder or stepladder: Make sure that it is fully opened. Do not climb a closed ladder. Make sure the sides of the ladder are locked in place. Have  someone hold the ladder while you use it. Know where your pets are as you move through your home. What can I do in the bathroom?     Keep the floor dry. Clean up any water that is on the floor right away. Remove soap buildup in the bathtub or shower. Buildup makes bathtubs and showers slippery. Use non-skid mats or decals on the floor of the bathtub or shower. Attach bath mats securely with double-sided, non-slip rug tape. If you need to sit down while you are in the shower, use a non-slip stool. Install grab bars by the toilet and in the bathtub and shower. Do not use towel bars as grab bars. What can I do in the bedroom? Make sure that you have a light by your bed that is easy to reach. Do not use any sheets or blankets on your bed that hang to the floor. Have a firm bench or chair with side arms that you can use for support when you get dressed. What can I do in the kitchen? Clean up any spills right away. If you need to reach something above you, use a sturdy step stool that has a grab bar. Keep electrical cables out of the way. Do not use floor polish or wax that makes floors slippery. What can I do with my stairs? Do not leave anything on the stairs. Make sure that you have a light switch at the top and the bottom of the stairs.  Have them installed if you do not have them. Make sure that there are handrails on both sides of the stairs. Fix handrails that are broken or loose. Make sure that handrails are as long as the staircases. Install non-slip stair treads on all stairs in your home if they do not have carpet. Avoid having throw rugs at the top or bottom of stairs, or secure the rugs with carpet tape to prevent them from moving. Choose a carpet design that does not hide the edge of steps on the stairs. Make sure that carpet is firmly attached to the stairs. Fix any carpet that is loose or worn. What can I do on the outside of my home? Use bright outdoor lighting. Repair the  edges of walkways and driveways and fix any cracks. Clear paths of anything that can make you trip, such as tools or rocks. Add color or contrast paint or tape to clearly mark and help you see high doorway thresholds. Trim any bushes or trees on the main path into your home. Check that handrails are securely fastened and in good repair. Both sides of all steps should have handrails. Install guardrails along the edges of any raised decks or porches. Have leaves, snow, and ice cleared regularly. Use sand, salt, or ice melt on walkways during winter months if you live where there is ice and snow. In the garage, clean up any spills right away, including grease or oil spills. What other actions can I take? Review your medicines with your health care provider. Some medicines can make you confused or feel dizzy. This can increase your chance of falling. Wear closed-toe shoes that fit well and support your feet. Wear shoes that have rubber soles and low heels. Use a cane, walker, scooter, or crutches that help you move around if needed. Talk with your provider about other ways that you can decrease your risk of falls. This may include seeing a physical therapist to learn to do exercises to improve movement and strength. Where to find more information Centers for Disease Control and Prevention, STEADI: TonerPromos.no [employer] on Aging: BaseRingTones.pl National Institute on Aging: BaseRingTones.pl Contact a health care provider if: You are afraid of falling at home. You feel weak, drowsy, or dizzy at home. You fall at home. Get help right away if you: Lose consciousness or have trouble moving after a fall. Have a fall that causes a head injury. These symptoms may be an emergency. Get help right away. Call 911. Do not wait to see if the symptoms will go away. Do not drive yourself to the hospital. This information is not intended to replace advice given to you by your health care provider. Make sure you  discuss any questions you have with your health care provider. Document Revised: 08/27/2021 Document Reviewed: 08/27/2021 Elsevier Patient Education  2024 [employer].

## 2022-08-28 NOTE — Progress Notes (Signed)
[facility] [address] [place]. [place] [address] [phone]       OFFICE CONSULT NOTE  [name] Date of Birth:  [birth year] Medical Record Number:  [id number]   Referring MD: Renford Dills  Reason for Referral: Neurological follow-up abnormal MRI and leg weakness  HPI: Ann Weiss is a 72 year old pleasant African-American lady seen today for initial office consultation visit for leg weakness and abnormal MRI.  History is obtained from the patient and review of electronic medical records and I personally reviewed pertinent available imaging films in PACS. She has past medical history of hypertension, hyperlipidemia, hypothyroidism, gastroesophageal reflux disease, diabetes and stroke.  She was initially admitted to [facility] from 11 06/2017 to 11 07/2017 with a 2-week history of dragging of the right foot.  MRI scan of the brain personally reviewed shows ill-defined diffusion hyperintensities in bilateral frontal lobes involving Perry callosal gyri and left parasagittal parietal gyrus some of it is T2 shine through from a previous lesion that is noted.  This was reported as an acute infarct however the patient's symptoms were clearly subacute and she had had some pre-existing leg weakness which got worse.  MR angiogram of the brain revealed no significant large vessel intracranial stenosis or occlusion.  Echocardiogram showed normal ejection fraction 55 to 65%.  Hemoglobin A1c was 8.3 and LDL cholesterol 85 mg percent.  Patient was placed on aspirin and statin.  Patient states since then she has had persistent right leg weakness and stiffness which has not changed a whole lot.  She was subsequently found to have back pain due to degenerative spine disease and MRI scan of the lumbar spine on 11/23/2017 showed severe left L4-5 facet arthrosis with severe spinal stenosis and right-sided neuroforaminal narrowing.  Patient states her back pain and  radicular pain relatively stable.  She has had no recent falls or back injury.  She does however state that she has known history of abnormal brain MRI scan with left parasagittal lesion which is of unclear etiology and being followed by serial MRIs since 2003.  This lesion has been thought to be possibly an epidermoid cystic lesion versus active MS plaque but no definitive diagnosis could be made.  MR spectroscopy was done on 2//07/2002 which did not show any significant decrease in an AA or increase in choline.  The lesion showed stability compared with previous MR spectroscopy from 09/21/2001 which had shown very minimal if any alteration of normal brain spectrum in the region of the left parasagittal parietal cortical lesion.  This lesion had not been mentioned on the last MRI report but lesion appears unchanged to slight decrease in size.  Patient continues to have stiffness and weakness of the right leg and at times drags the right leg while walking.  She has not had any increasing leg weakness or gait difficulty recently.  She was last seen by neurologist Dr. Everlena Cooper in 2021 did not follow-up since then. .  ROS:   14 system review of systems is positive for leg weakness, difficulty walking, back pain all other systems negative  PMH:  Past Medical History:  Diagnosis Date   Breast mass, right    Constipation    Diabetes mellitus    GERD (gastroesophageal reflux disease)    Goiter    Hyperlipidemia    Hypertension    Hypothyroidism    Premature menopause age 11   Stroke (HCC)    sl weakness on right side-uses cane to walk  Vitamin D deficiency 2009    Social History:  Social History   Socioeconomic History   Marital status: Married    Spouse name: Dorene Sorrow   Number of children: 2   Years of education: Not on file   Highest education level: Bachelor's degree (e.g., BA, AB, BS)  Occupational History   Occupation: Retired from [employer]: RETIRED  Tobacco Use   Smoking status:  Never   Smokeless tobacco: Never  Vaping Use   Vaping status: Never Used  Substance and Sexual Activity   Alcohol use: Yes    Comment: socially maybe 1-2 times monthly   Drug use: No   Sexual activity: Not Currently  Other Topics Concern   Not on file  Social History Narrative    Daughter lives in [place] and other daughter lives in [place], [place]. 4 grandchildren      Patient is right-handed. She lives with her husband in a two level home. She drinks 2 cups of coffee a day, she does not exercise..   Social Determinants of Health   Financial Resource Strain: Not on file  Food Insecurity: Not on file  Transportation Needs: Not on file  Physical Activity: Not on file  Stress: Not on file  Social Connections: Not on file  Intimate Partner Violence: Not on file    Medications:   Current Outpatient Medications on File Prior to Visit  Medication Sig Dispense Refill   amLODipine (NORVASC) 10 MG tablet Take 1 tablet (10 mg total) by mouth daily. 30 tablet 5   aspirin EC 81 MG tablet Take 81 mg by mouth as needed.     BAYER CONTOUR TEST test strip TEST BLOOD SUGARS 3 TIMES DAILY 100 each 2   levothyroxine (SYNTHROID, LEVOTHROID) 75 MCG tablet Take 75 mcg by mouth daily.  3   metFORMIN (GLUCOPHAGE) 1000 MG tablet take 1 tablet by mouth twice a day with food 60 tablet 5   omeprazole (PRILOSEC) 20 MG capsule take 1 capsule by mouth once daily (Patient taking differently: take 1 capsule by mouth as needed) 30 capsule 11   rosuvastatin (CRESTOR) 40 MG tablet Take 1 tablet (40 mg total) by mouth daily. 90 tablet 1   telmisartan-hydrochlorothiazide (MICARDIS HCT) 80-25 MG tablet Take 1 tablet by mouth daily.  6   hydrALAZINE (APRESOLINE) 50 MG tablet Take 50 mg by mouth 2 (two) times daily.  (Patient not taking: Reported on 08/28/2022)     lactulose (CHRONULAC) 10 GM/15ML solution Take 10 g by mouth daily as needed. (Patient not taking: Reported on 08/28/2022)     No current facility-administered  medications on file prior to visit.    Allergies:   Allergies  Allergen Reactions   Ciprofloxacin     Physical Exam General: well developed, well nourished pleasant elderly African-American lady, seated, in no evident distress Head: head normocephalic and atraumatic.   Neck: supple with no carotid or supraclavicular bruits Cardiovascular: regular rate and rhythm, no murmurs Musculoskeletal: no deformity Skin:  no rash/petichiae Vascular:  Normal pulses all extremities  Neurologic Exam Mental Status: Awake and fully alert. Oriented to place and time. Recent and remote memory intact. Attention span, concentration and fund of knowledge appropriate. Mood and affect appropriate.  Cranial Nerves: Fundoscopic exam reveals sharp disc margins. Pupils equal, briskly reactive to light. Extraocular movements full without nystagmus. Visual fields full to confrontation. Hearing intact. Facial sensation intact. Face, tongue, palate moves normally and symmetrically.  Motor: Normal bulk and tone. Normal strength  in all tested extremity muscles except mild weakness of right hip flexors and ankle dorsiflexors.  Tone is increased in the right leg with mild spasticity..  Mild weakness of right grip and intrinsic hand muscles.  Fine finger movements are diminished on the right.  Orbits left or right upper extremity. Sensory.: intact to touch , pinprick , position and vibratory sensation.  Coordination: Rapid alternating movements normal in all extremities. Finger-to-nose and heel-to-shin performed accurately bilaterally. Gait and Station: Arises from chair without difficulty. Stance is normal. Gait demonstrates mild stiffness and dragging of the right leg.  Not able to walk tandem  reflexes: 2+ and asymmetric and brisker on the right.. Toes downgoing.   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  I doubt this represents low-grade glioma or demyelination due to lack of significant worsening over time.  She had some worsening of the right leg weakness in 2019 thought to be due to a ACA infarct however MRI findings at that time were equivocal as diffusion changes may have been related to T2 shine through from the cystic lesion.  She also has lumbar spinal stenosis and degenerative spine disease.  She has persistent leg weakness and gait difficulties but they are not significantly changed.  Vascular risk factors of diabetes, hypertension hyperlipidemia.     PLAN:I had a long discussion with the patient with regards to her longstanding right leg weakness as well as abnormal brain MRI scan as well as lumbar spinal stenosis and answered questions.  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  Recommend check repeat MRI scan of the brain with and without contrast and lumbar spine.  Continue aspirin for stroke prevention maintain aggressive risk factor modification.  Check lipid profile and hemoglobin A1c.  Maintain strict control of hypertension with blood pressure goal below 130/90, lipids with LDL cholesterol goal below 70 mg percent and diabetes with hemoglobin A1c goal below 6.5%.  Continue to use a cane for ambulation and we discussed fall safety precautions.  Return for follow-up in the future in 3 to 4 months or call earlier if necessary. Greater than 50% time during this 60-minute consultation visit was spent in counseling and coordination of care about her chronic right leg weakness and abnormal MRI scan of the brain and spine and answering questions. [name], MD Note: This document was prepared with digital dictation and possible smart phrase technology. Any transcriptional errors  that result from this process are unintentional.

## 2022-08-29 LAB — LIPID PANEL
Chol/HDL Ratio: 2.6 ratio (ref 0.0–4.4)
Cholesterol, Total: 199 mg/dL (ref 100–199)
HDL: 77 mg/dL (ref >39)
LDL Chol Calc (NIH): 108 mg/dL — ABNORMAL HIGH (ref 0–99)
Triglycerides: 78 mg/dL (ref 0–149)
VLDL Cholesterol Cal: 14 mg/dL (ref 5–40)

## 2022-08-29 LAB — HEMOGLOBIN A1C
Est. average glucose Bld gHb Est-mCnc: 163 mg/dL
Hgb A1c MFr Bld: 7.3 % — ABNORMAL HIGH (ref 4.8–5.6)

## 2022-09-09 NOTE — Progress Notes (Signed)
Kindly inform the patient that lab work for both cholesterol and diabetes is not satisfactory and she needs to see primary care physician for optimal management of both these conditions.

## 2022-09-10 ENCOUNTER — Telehealth: Payer: Self-pay | Admitting: Neurology

## 2022-09-10 ENCOUNTER — Telehealth: Payer: Self-pay

## 2022-09-10 NOTE — Telephone Encounter (Signed)
Called patient and informed her per Dr Pearlean Brownie "Kindly inform the patient that lab work for both cholesterol and diabetes is not satisfactory and she needs to see primary care physician for optimal management of both these conditions." Pt verbalized understanding. Pt had no questions at this time but was encouraged to call back if questions arise.

## 2022-09-10 NOTE — Telephone Encounter (Signed)
medicare NPR, [employer] GEHA Magnus Ivan) Berkley Harvey: [id number] exp. 09/03/22-12/02/22 sent to GI [phone]

## 2022-09-10 NOTE — Telephone Encounter (Signed)
-----   Message from [name] sent at 09/09/2022  8:43 PM EDT ----- Ann Weiss inform the patient that lab work for both cholesterol and diabetes is not satisfactory and she needs to see primary care physician for optimal management of both these conditions.

## 2022-10-09 ENCOUNTER — Other Ambulatory Visit: Payer: Self-pay | Admitting: Neurology

## 2022-10-09 NOTE — Telephone Encounter (Signed)
Pt called stating that she is needing something to be called in to keep her calm during her MRI. Pt would like it to be called in to the [facility] [place], [place]  [phone]

## 2022-10-10 ENCOUNTER — Other Ambulatory Visit: Payer: Self-pay | Admitting: Neurology

## 2022-10-10 MED ORDER — ALPRAZOLAM 0.25 MG PO TABS: 0.2500 mg | ORAL_TABLET | ORAL | 0 refills | Status: DC

## 2022-10-11 NOTE — Telephone Encounter (Signed)
Pt called stating that her Rx was sent to the wrong pharmacy and is needing it sent to [facility] [place]. Please advise.

## 2022-10-14 ENCOUNTER — Other Ambulatory Visit: Payer: Self-pay | Admitting: Neurology

## 2022-10-14 MED ORDER — ALPRAZOLAM 0.5 MG PO TABS: 0.5000 mg | ORAL_TABLET | Freq: Once | ORAL | 0 refills | Status: DC | PRN

## 2022-10-23 ENCOUNTER — Ambulatory Visit
Admission: RE | Admit: 2022-10-23 | Discharge: 2022-10-23 | Disposition: A | Discharge status: Home / Self Care | Payer: No Typology Code available for payment source | Source: Ambulatory Visit | Attending: Neurology | Admitting: Neurology

## 2022-10-23 DIAGNOSIS — R29898 Other symptoms and signs involving the musculoskeletal system: Secondary | ICD-10-CM | POA: Diagnosis not present

## 2022-10-23 MED ORDER — GADOPICLENOL 0.5 MMOL/ML IV SOLN
9.0000 mL | Freq: Once | INTRAVENOUS | Status: AC | PRN
  Administered 2022-10-23: 9 mL via INTRAVENOUS

## 2022-10-25 ENCOUNTER — Ambulatory Visit
Admission: RE | Admit: 2022-10-25 | Discharge: 2022-10-25 | Disposition: A | Discharge status: Home / Self Care | Payer: No Typology Code available for payment source | Source: Ambulatory Visit | Attending: Neurology | Admitting: Neurology

## 2022-10-25 DIAGNOSIS — R29898 Other symptoms and signs involving the musculoskeletal system: Secondary | ICD-10-CM | POA: Diagnosis not present

## 2022-10-25 MED ORDER — GADOPICLENOL 0.5 MMOL/ML IV SOLN
9.0000 mL | Freq: Once | INTRAVENOUS | Status: AC | PRN
  Administered 2022-10-25: 9 mL via INTRAVENOUS

## 2022-10-25 NOTE — Progress Notes (Signed)
Kindly inform the patient that MRI scan of the brain shows no new or worrisome findings.  Several small old strokes noted in the deep portions of the brain with some changes of hardening of the arteries which are unchanged from previous MRI from 2019.  Nothing to worry about

## 2022-10-28 ENCOUNTER — Telehealth: Payer: Self-pay | Admitting: Anesthesiology

## 2022-10-28 NOTE — Telephone Encounter (Signed)
-----   Message from [name] sent at 10/25/2022  9:19 AM EDT ----- Ann Weiss inform the patient that MRI scan of the brain shows no new or worrisome findings.  Several small old strokes noted in the deep portions of the brain with some changes of hardening of the arteries which are unchanged from previous MRI from 2019.  Nothing to worry about

## 2022-11-04 NOTE — Telephone Encounter (Signed)
Pt called and LVM wanting to know if she can discuss her MRI's with MD. Please advise.

## 2022-11-05 NOTE — Telephone Encounter (Signed)
Awaiting MRI lumbar result.

## 2022-11-10 NOTE — Progress Notes (Signed)
Kindly inform the patient that MRI scan lumbar spine shows severe disc degenerative changes at L4-5 with severe spinal stenosis and narrowing of the bony foraminal through which the nerves come out on both sides with potential pinching of the nerves.  There is also mild degenerative changes at other levels

## 2022-11-13 ENCOUNTER — Telehealth: Payer: Self-pay | Admitting: Neurology

## 2022-11-13 DIAGNOSIS — M48061 Spinal stenosis, lumbar region without neurogenic claudication: Secondary | ICD-10-CM

## 2022-11-13 DIAGNOSIS — R29898 Other symptoms and signs involving the musculoskeletal system: Secondary | ICD-10-CM

## 2022-11-13 DIAGNOSIS — G930 Cerebral cysts: Secondary | ICD-10-CM

## 2022-11-13 NOTE — Telephone Encounter (Signed)
-----   Message from [name] sent at 11/10/2022  3:10 PM EST ----- Kindly inform the patient that MRI scan lumbar spine shows severe disc degenerative changes at L4-5 with severe spinal stenosis and narrowing of the bony foraminal through which the nerves come out on both sides with potential pinching of the nerves.  There is also mild degenerative changes at other levels

## 2022-11-13 NOTE — Telephone Encounter (Signed)
Called the patient back and reviewed the results of the MRI lumbar spine. Upon review of the results she states that she is not having any newer symptoms that would feel there is anything concerning. In the past she did see Dr Yetta Barre and would like to have him re evaluate things and see if there is anything needed at this time. She has already worked with PT for this and would like a referral to [facility] Order will be placed to Dr Yetta Barre

## 2022-11-14 ENCOUNTER — Inpatient Hospital Stay
Payer: No Typology Code available for payment source | Attending: Hematology and Oncology | Admitting: Hematology and Oncology

## 2022-11-14 VITALS — BP 151/64 | HR 69 | Temp 97.2°F | Resp 18 | Ht 66.0 in | Wt 198.3 lb

## 2022-11-14 DIAGNOSIS — Z9189 Other specified personal risk factors, not elsewhere classified: Secondary | ICD-10-CM | POA: Diagnosis not present

## 2022-11-14 DIAGNOSIS — M48061 Spinal stenosis, lumbar region without neurogenic claudication: Secondary | ICD-10-CM | POA: Diagnosis not present

## 2022-11-14 DIAGNOSIS — Z8673 Personal history of transient ischemic attack (TIA), and cerebral infarction without residual deficits: Secondary | ICD-10-CM | POA: Diagnosis not present

## 2022-11-14 DIAGNOSIS — D241 Benign neoplasm of right breast: Secondary | ICD-10-CM

## 2022-11-14 NOTE — Progress Notes (Signed)
Patient Care Team: Renford Dills, MD as PCP - General (Internal Medicine) Drema Dallas, DO as Consulting Physician (Neurology)  DIAGNOSIS:  Encounter Diagnoses  Name Primary?   Intraductal papilloma of right breast Yes   At high risk for breast cancer       CHIEF COMPLIANT: Breast cancer surveillance  HISTORY OF PRESENT ILLNESS:   History of Present Illness   The patient, with a history of breast cancer, presents for a routine follow-up. She reports having had a couple of surgeries since her last visit two years ago, specifically on her hand in January of 2023. However, the relief from the surgery was short-lived, and she has been receiving injections for ongoing hand issues since June of 2023. She had a mammogram in July, which showed good results. She is also involved in various activities and organizations, including the [employer] [place] [employer], a couple of sororities, and church work, which keep her active. She had a stroke in 2019, which has affected her mobility, but she has opted out of back surgery as she currently does not have any back problems.         ALLERGIES:  is allergic to ciprofloxacin.  MEDICATIONS:  Current Outpatient Medications  Medication Sig Dispense Refill   amLODipine (NORVASC) 10 MG tablet Take 1 tablet (10 mg total) by mouth daily. 30 tablet 5   aspirin EC 81 MG tablet Take 81 mg by mouth as needed.     BAYER CONTOUR TEST test strip TEST BLOOD SUGARS 3 TIMES DAILY 100 each 2   levothyroxine (SYNTHROID, LEVOTHROID) 75 MCG tablet Take 75 mcg by mouth daily.  3   metFORMIN (GLUCOPHAGE) 1000 MG tablet take 1 tablet by mouth twice a day with food 60 tablet 5   omeprazole (PRILOSEC) 20 MG capsule take 1 capsule by mouth once daily (Patient taking differently: take 1 capsule by mouth as needed) 30 capsule 11   rosuvastatin (CRESTOR) 40 MG tablet Take 1 tablet (40 mg total) by mouth daily. 90 tablet 1   telmisartan-hydrochlorothiazide (MICARDIS HCT)  80-25 MG tablet Take 1 tablet by mouth daily.  6   No current facility-administered medications for this visit.    PHYSICAL EXAMINATION: ECOG PERFORMANCE STATUS: 1 - Symptomatic but completely ambulatory  Vitals:   11/14/22 1353  BP: (!) 151/64  Pulse: 69  Resp: 18  Temp: (!) 97.2 F (36.2 C)  SpO2: 100%   Filed Weights   11/14/22 1353  Weight: 198 lb 4.8 oz (89.9 kg)    Physical Exam   BREAST: No lumps palpated, examination normal.      (exam performed in the presence of a chaperone)  LABORATORY DATA:  I have reviewed the data as listed    Latest Ref Rng & Units 08/24/2019   11:00 AM 04/01/2019    9:14 PM 07/29/2018    5:20 PM  CMP  Glucose 70 - 99 mg/dL 829  562  130   BUN 8 - 23 mg/dL 13  14  18    Creatinine 0.44 - 1.00 mg/dL 8.65  7.84  6.96   Sodium 135 - 145 mmol/L 138  141  140   Potassium 3.5 - 5.1 mmol/L 3.9  3.6  3.9   Chloride 98 - 111 mmol/L 101  105  107   CO2 22 - 32 mmol/L 27  25  24    Calcium 8.9 - 10.3 mg/dL 9.4  9.5  9.3   Total Protein 6.5 - 8.1 g/dL  7.5    Total Bilirubin 0.3 - 1.2 mg/dL  0.8    Alkaline Phos 38 - 126 U/L  55    AST 15 - 41 U/L  18    ALT 0 - 44 U/L  12      Lab Results  Component Value Date   WBC 9.2 04/01/2019   HGB 13.4 04/01/2019   HCT 41.7 04/01/2019   MCV 91.4 04/01/2019   PLT 198 04/01/2019   NEUTROABS 6.2 04/01/2019    ASSESSMENT & PLAN:  Intraductal papilloma of right breast 05/26/2019: Right breast biopsy LOQ: Hyalinized intraductal papilloma with calcifications, atypical ductal hyperplasia. 08/27/2019: Right lumpectomy: Hyalinized intraductal papilloma.  Usual ductal epithelial hyperplasia.   Tyrer Cusick risk: 1.  10-year risk: 13.5% (average of 3.5%) 2. lifetime risk: 20.5% (average risk 6%)   Breast cancer surveillance: 1.  Annual mammograms: 07/10/2022: Benign 2. breast MRIs periodically once every 3 years or so.  Breast MRI at 10/10/2020: Benign breast density category B Next MRI will be done in  2025 3.  MRI lumbar spine: Spinal stenosis, degenerative changes 4.  MRI brain 10/23/2022 (ordered by neurology) no evidence of metastatic disease  Return to clinic in 1 year for follow-up and after that we can see her every 2 years   ------------------------------------- Assessment and Plan    Breast Cancer Surveillance Normal mammogram in July 2024. Last MRI in 2022. No palpable abnormalities on today's breast exam. -Schedule MRI for October 2025. -Return to clinic in 1 year to review MRI results.        Orders Placed This Encounter  Procedures   MR BREAST BILATERAL W WO CONTRAST INC CAD    Standing Status:   Future    Standing Expiration Date:   11/14/2023    Order Specific Question:   If indicated for the ordered procedure, I authorize the administration of contrast media per Radiology protocol    Answer:   Yes    Order Specific Question:   What is the patient's sedation requirement?    Answer:   No Sedation    Order Specific Question:   Does the patient have a pacemaker or implanted devices?    Answer:   No    Order Specific Question:   Preferred imaging location?    Answer:   [facility] (table limit-550lbs)    Order Specific Question:   Release to patient    Answer:   Immediate   The patient has a good understanding of the overall plan. she agrees with it. she will call with any problems that may develop before the next visit here. Total time spent: 30 mins including face to face time and time spent for planning, charting and co-ordination of care   Tamsen Meek, MD 11/14/22

## 2022-11-14 NOTE — Assessment & Plan Note (Addendum)
05/26/2019: Right breast biopsy LOQ: Hyalinized intraductal papilloma with calcifications, atypical ductal hyperplasia. 08/27/2019: Right lumpectomy: Hyalinized intraductal papilloma.  Usual ductal epithelial hyperplasia.   Tyrer Cusick risk: 1.  10-year risk: 13.5% (average of 3.5%) 2. lifetime risk: 20.5% (average risk 6%)   Breast cancer surveillance: 1.  Annual mammograms: 07/10/2022: Benign 2. breast MRIs periodically once every 3 years or so.  Breast MRI at 10/10/2020: Benign breast density category B Next MRI will be done in 2025 3.  MRI lumbar spine: Spinal stenosis, degenerative changes 4.  MRI brain 10/23/2022 (ordered by neurology) no evidence of metastatic disease  Return to clinic in 1 year for follow-up and after that we can see her every 2 years

## 2022-11-18 ENCOUNTER — Telehealth: Payer: Self-pay | Admitting: Neurology

## 2022-11-18 NOTE — Telephone Encounter (Signed)
Referral for neurosurgery fax to [facility]. Phone: [phone], Fax: [phone] 

## 2023-01-30 ENCOUNTER — Telehealth: Payer: Self-pay

## 2023-01-30 NOTE — Telephone Encounter (Signed)
The patient called in and left a voicemail requesting to schedule appointment with Ann Weiss. I called her back to let her know we received her message, and  no answer was not able to leave voicemail.

## 2023-01-30 NOTE — Telephone Encounter (Signed)
The patient called in and left a voicemail requesting to [place] to call back. I called her back to let her know we received her message, and I sent the message to [facility].

## 2023-01-31 ENCOUNTER — Other Ambulatory Visit: Payer: Self-pay | Admitting: Internal Medicine

## 2023-01-31 DIAGNOSIS — M8580 Other specified disorders of bone density and structure, unspecified site: Secondary | ICD-10-CM

## 2023-02-03 ENCOUNTER — Telehealth: Payer: Self-pay

## 2023-02-03 NOTE — Telephone Encounter (Signed)
Left detailed message on VM.  Call patient and notify her that I am transferring to [facility] gastroenterology office in April.  She can call  GI after 04/13/2023 and schedule appointment with me there.  Celso Amy, PA-C

## 2023-02-13 ENCOUNTER — Ambulatory Visit
Admission: RE | Admit: 2023-02-13 | Discharge: 2023-02-13 | Disposition: A | Discharge status: Home / Self Care | Payer: No Typology Code available for payment source | Source: Ambulatory Visit | Attending: Nurse Practitioner | Admitting: Nurse Practitioner

## 2023-02-13 ENCOUNTER — Other Ambulatory Visit: Payer: Self-pay | Admitting: Nurse Practitioner

## 2023-02-13 DIAGNOSIS — K5909 Other constipation: Secondary | ICD-10-CM

## 2023-03-01 ENCOUNTER — Other Ambulatory Visit: Payer: Self-pay | Admitting: Internal Medicine

## 2023-03-01 ENCOUNTER — Ambulatory Visit
Admission: RE | Admit: 2023-03-01 | Discharge: 2023-03-01 | Disposition: A | Discharge status: Home / Self Care | Payer: No Typology Code available for payment source | Source: Ambulatory Visit | Attending: Internal Medicine | Admitting: Internal Medicine

## 2023-03-01 DIAGNOSIS — Z0000 Encounter for general adult medical examination without abnormal findings: Secondary | ICD-10-CM

## 2023-03-01 DIAGNOSIS — M8580 Other specified disorders of bone density and structure, unspecified site: Secondary | ICD-10-CM

## 2023-03-04 LAB — HM DEXA SCAN

## 2023-03-25 ENCOUNTER — Encounter: Payer: Self-pay | Admitting: Neurology

## 2023-03-25 ENCOUNTER — Ambulatory Visit: Payer: Medicare Other | Admitting: Neurology

## 2023-03-25 VITALS — BP 132/75 | HR 68 | Ht 66.0 in | Wt 196.0 lb

## 2023-03-25 DIAGNOSIS — R930 Abnormal findings on diagnostic imaging of skull and head, not elsewhere classified: Secondary | ICD-10-CM

## 2023-03-25 DIAGNOSIS — R29898 Other symptoms and signs involving the musculoskeletal system: Secondary | ICD-10-CM

## 2023-03-25 DIAGNOSIS — M48061 Spinal stenosis, lumbar region without neurogenic claudication: Secondary | ICD-10-CM

## 2023-03-25 NOTE — Progress Notes (Signed)
 [facility] [address] [place] [place] [address] [phone]       OFFICE FOLLOW-UP VISIT NOTE  [name] Date of Birth:  17-Sep-1950 Medical Record Number:  [id number]   Referring MD: Ann Weiss  Reason for Referral: Neurological follow-up abnormal MRI and leg weakness  HPI: Initial visit 08/28/2022 Ann Weiss is a 73 year old pleasant African-American lady seen today for initial office consultation visit for leg weakness and abnormal MRI.  History is obtained from the patient and review of electronic medical records and I personally reviewed pertinent available imaging films in PACS. She has past medical history of hypertension, hyperlipidemia, hypothyroidism, gastroesophageal reflux disease, diabetes and stroke.  She was initially admitted to [facility] from 11 06/2017 to 11 07/2017 with a 2-week history of dragging of the right foot.  MRI scan of the brain personally reviewed shows ill-defined diffusion hyperintensities in bilateral frontal lobes involving Perry callosal gyri and left parasagittal parietal gyrus some of it is T2 shine through from a previous lesion that is noted.  This was reported as an acute infarct however the patient's symptoms were clearly subacute and she had had some pre-existing leg weakness which got worse.  MR angiogram of the brain revealed no significant large vessel intracranial stenosis or occlusion.  Echocardiogram showed normal ejection fraction 55 to 65%.  Hemoglobin A1c was 8.3 and LDL cholesterol 85 mg percent.  Patient was placed on aspirin and statin.  Patient states since then she has had persistent right leg weakness and stiffness which has not changed a whole lot.  She was subsequently found to have back pain due to degenerative spine disease and MRI scan of the lumbar spine on 11/23/2017 showed severe left L4-5 facet arthrosis with severe spinal stenosis and right-sided neuroforaminal narrowing.   Patient states her back pain and radicular pain relatively stable.  She has had no recent falls or back injury.  She does however state that she has known history of abnormal brain MRI scan with left parasagittal lesion which is of unclear etiology and being followed by serial MRIs since 2003.  This lesion has been thought to be possibly an epidermoid cystic lesion versus active MS plaque but no definitive diagnosis could be made.  MR spectroscopy was done on 2//07/2002 which did not show any significant decrease in an AA or increase in choline.  The lesion showed stability compared with previous MR spectroscopy from 09/21/2001 which had shown very minimal if any alteration of normal brain spectrum in the region of the left parasagittal parietal cortical lesion.  This lesion had not been mentioned on the last MRI report but lesion appears unchanged to slight decrease in size.  Patient continues to have stiffness and weakness of the right leg and at times drags the right leg while walking.  She has not had any increasing leg weakness or gait difficulty recently.  She was last seen by neurologist Dr. Everlena Cooper in 2021 did not follow-up since then. Update 03/25/2023 : She returns for follow-up after last visit 6 months ago.  She states she is doing well.  She has had no recent falls or injuries.  She is cautious with walking and uses a cane most of the time.  She is bothered by some right hip discomfort.  She denies any significant back pain or radicular pain or numbness or tingling in her feet.  She did undergo MRI scan lumbar spine after last visit on 10/25/2022 which shows severe degenerative changes  at L4-5 with severe spinal canal stenosis and moderate to severe right sided and moderate left-sided foraminal narrowing with potential encroachment on exiting right L4 and left L5 nerve roots.  Patient is not keen to undergo any surgical intervention for her spine at this time.  MRI scan of the brain shows stable appearance  of the white matter abnormalities in the left parasagittal region and mild periventricular subcortical and juxtacortical changes of chronic small vessel disease overall stable compared with previous MRI from 2019.  She continues to have some stiffness and weakness in the right leg but it is stable.  She remains on aspirin she is tolerating well without bruising or bleeding.  She has had no recurrent stroke or TIA symptoms.  She states her blood pressure is under good control today it is 129/63.  She remains on Crestor which is tolerating well without side effects.  She states of sugars are not well-controlled and fasting sugars range from 120-150s.  Last hemoglobin A1c was 7.3 on 08/28/2022.  LDL cholesterol is 108 mg percent.  She has no new complaints. .  ROS:   14 system review of systems is positive for leg weakness, difficulty walking, back pain all other systems negative  PMH:  Past Medical History:  Diagnosis Date   Breast mass, right    Constipation    Diabetes mellitus    GERD (gastroesophageal reflux disease)    Goiter    Hyperlipidemia    Hypertension    Hypothyroidism    Premature menopause age 78   Stroke (HCC)    sl weakness on right side-uses cane to walk   Vitamin D deficiency 2009    Social History:  Social History   Socioeconomic History   Marital status: Married    Spouse name: Dorene Sorrow   Number of children: 2   Years of education: Not on file   Highest education level: Bachelor's degree (e.g., BA, AB, BS)  Occupational History   Occupation: Retired from [employer]: RETIRED  Tobacco Use   Smoking status: Never   Smokeless tobacco: Never  Vaping Use   Vaping status: Never Used  Substance and Sexual Activity   Alcohol use: Yes    Comment: socially maybe 1-2 times monthly   Drug use: No   Sexual activity: Not Currently  Other Topics Concern   Not on file  Social History Narrative    Daughter lives in [place] and other daughter lives in [place], [place]. 4  grandchildren      Patient is right-handed. She lives with her husband in a two level home. She drinks 2 cups of coffee a day, she does not exercise..   Social Drivers of Health   Financial Resource Strain: Not on file  Food Insecurity: Low Risk  (12/25/2022)   Received from [facility]   Hunger Vital Sign    Worried About Running Out of Food in the Last Year: Never true    Ran Out of Food in the Last Year: Never true  Transportation Needs: No Transportation Needs (12/25/2022)   Received from [facility]    In the past 12 months, has lack of reliable transportation kept you from medical appointments, meetings, work or from getting things needed for daily living? : No  Physical Activity: Not on file  Stress: Not on file  Social Connections: Not on file  Intimate Partner Violence: Not on file    Medications:   Current Outpatient Medications on File Prior  to Visit  Medication Sig Dispense Refill   amLODipine (NORVASC) 10 MG tablet Take 1 tablet (10 mg total) by mouth daily. 30 tablet 5   aspirin EC 81 MG tablet Take 81 mg by mouth as needed.     BAYER CONTOUR TEST test strip TEST BLOOD SUGARS 3 TIMES DAILY 100 each 2   levothyroxine (SYNTHROID, LEVOTHROID) 75 MCG tablet Take 75 mcg by mouth daily.  3   metFORMIN (GLUCOPHAGE) 1000 MG tablet take 1 tablet by mouth twice a day with food 60 tablet 5   omeprazole (PRILOSEC) 20 MG capsule take 1 capsule by mouth once daily (Patient taking differently: take 1 capsule by mouth as needed) 30 capsule 11   rosuvastatin (CRESTOR) 40 MG tablet Take 1 tablet (40 mg total) by mouth daily. 90 tablet 1   telmisartan-hydrochlorothiazide (MICARDIS HCT) 80-25 MG tablet Take 1 tablet by mouth daily.  6   No current facility-administered medications on file prior to visit.    Allergies:   Allergies  Allergen Reactions   Ciprofloxacin     Physical Exam General: well developed, well nourished pleasant elderly African-American  lady, seated, in no evident distress Head: head normocephalic and atraumatic.   Neck: supple with no carotid or supraclavicular bruits Cardiovascular: regular rate and rhythm, no murmurs Musculoskeletal: no deformity Skin:  no rash/petichiae Vascular:  Normal pulses all extremities  Neurologic Exam Mental Status: Awake and fully alert. Oriented to place and time. Recent and remote memory intact. Attention span, concentration and fund of knowledge appropriate. Mood and affect appropriate.  Cranial Nerves: Fundoscopic exam reveals sharp disc margins. Pupils equal, briskly reactive to light. Extraocular movements full without nystagmus. Visual fields full to confrontation. Hearing intact. Facial sensation intact. Face, tongue, palate moves normally and symmetrically.  Motor: Normal bulk and tone. Normal strength in all tested extremity muscles except mild weakness of right hip flexors and ankle dorsiflexors.  Tone is increased in the right leg with mild spasticity..  Mild weakness of right grip and intrinsic hand muscles.  Fine finger movements are diminished on the right.  Orbits left or right upper extremity. Sensory.: intact to touch , pinprick , position and vibratory sensation.  Coordination: Rapid alternating movements normal in all extremities. Finger-to-nose and heel-to-shin performed accurately bilaterally. Gait and Station: Arises from chair without difficulty. Stance is normal. Gait demonstrates mild stiffness and dragging of the right leg.  Not able to walk tandem  reflexes: 2+ and asymmetric and brisker on the right.. Toes downgoing.   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  I doubt this represents low-grade glioma or demyelination due to lack of  significant worsening over time.  She had some worsening of the right leg weakness in 2019 thought to be due to a ACA infarct however MRI findings at that time were equivocal as diffusion changes may have been related to T2 shine through from the cystic lesion.  She also has lumbar spinal stenosis and degenerative spine disease which also appears stable she has persistent leg weakness and gait difficulties but they are not significantly changed.  Vascular risk factors of diabetes, hypertension hyperlipidemia.     PLAN:I had a long discussion with the patient with regards to her longstanding right leg weakness as well as abnormal brain MRI scan as well as lumbar  spinal stenosis and answered questions.  Since patient is doing reasonably well I do not recommend more aggressive treatment for her spinal stenosis at this time.  Continue aspirin for stroke prevention maintain aggressive risk factor modification.  Check lipid profile and hemoglobin A1c.  Maintain strict control of hypertension with blood pressure goal below 130/90, lipids with LDL cholesterol goal below 70 mg percent and diabetes with hemoglobin A1c goal below 6.5%.  Continue to use a cane for ambulation and we discussed fall safety precautions.  Return for follow-up in the future in 1 year or call earlier if necessary. Greater than 50% time during this 35 minute   visit was spent in counseling and coordination of care about her chronic right leg weakness and abnormal MRI scan of the brain and spine and answering questions. [name], MD Note: This document was prepared with digital dictation and possible smart phrase technology. Any transcriptional errors that result from this process are unintentional.

## 2023-03-25 NOTE — Patient Instructions (Signed)
 I had a long discussion with the patient with regards to her longstanding right leg weakness as well as abnormal brain MRI scan as well as lumbar spinal stenosis and answered questions.  Since patient is doing reasonably well I do not recommend more aggressive treatment for her spinal stenosis at this time.  Continue aspirin for stroke prevention maintain aggressive risk factor modification.  Check lipid profile and hemoglobin A1c.  Maintain strict control of hypertension with blood pressure goal below 130/90, lipids with LDL cholesterol goal below 70 mg percent and diabetes with hemoglobin A1c goal below 6.5%.  Continue to use a cane for ambulation and we discussed fall safety precautions.  Return for follow-up in the future in 1 year or call earlier if necessary.

## 2023-07-14 ENCOUNTER — Ambulatory Visit
Admission: RE | Admit: 2023-07-14 | Discharge: 2023-07-14 | Disposition: A | Discharge status: Home / Self Care | Payer: No Typology Code available for payment source | Source: Ambulatory Visit | Attending: Internal Medicine | Admitting: Internal Medicine

## 2023-07-14 DIAGNOSIS — Z0000 Encounter for general adult medical examination without abnormal findings: Secondary | ICD-10-CM

## 2023-07-17 ENCOUNTER — Other Ambulatory Visit: Payer: Self-pay | Admitting: Orthopedic Surgery

## 2023-07-17 LAB — HM MAMMOGRAPHY

## 2023-07-30 LAB — CBC AND DIFFERENTIAL
HCT: 39 (ref 36–46)
Hemoglobin: 13.4 (ref 12.0–16.0)
Platelets: 220 K/uL (ref 150–400)
WBC: 7.3

## 2023-07-30 LAB — BASIC METABOLIC PANEL WITH GFR
BUN: 19 (ref 4–21)
CO2: 31 — AB (ref 13–22)
Chloride: 102 (ref 99–108)
Creatinine: 1.1 (ref 0.5–1.1)
Glucose: 200
Potassium: 4.1 meq/L (ref 3.5–5.1)
Sodium: 140 (ref 137–147)

## 2023-07-30 LAB — LIPID PANEL
Cholesterol: 241 — AB (ref 0–200)
HDL: 66 (ref 35–70)
Triglycerides: 125 (ref 40–160)

## 2023-07-30 LAB — TESTOSTERONE: Testosterone: 5.39

## 2023-07-30 LAB — MICROALBUMIN, URINE: Microalb, Ur: 0.7

## 2023-07-30 LAB — COMPREHENSIVE METABOLIC PANEL WITH GFR
Albumin: 4.5 (ref 3.5–5.0)
Calcium: 9.8 (ref 8.7–10.7)
eGFR: 53

## 2023-07-30 LAB — MICROALBUMIN / CREATININE URINE RATIO: Microalb Creat Ratio: 14.1

## 2023-07-30 LAB — CBC: RBC: 4.45 (ref 3.87–5.11)

## 2023-07-30 LAB — HEPATIC FUNCTION PANEL
ALT: 5 U/L — AB (ref 7–35)
AST: 13 (ref 13–35)

## 2023-07-30 LAB — PROTEIN / CREATININE RATIO, URINE: Creatinine, Urine: 50

## 2023-07-30 LAB — HEMOGLOBIN A1C: Hemoglobin A1C: 7.4

## 2023-08-28 ENCOUNTER — Ambulatory Visit (HOSPITAL_BASED_OUTPATIENT_CLINIC_OR_DEPARTMENT_OTHER): Admission: RE | Admit: 2023-08-28 | Payer: Self-pay | Source: Home / Self Care | Admitting: Orthopedic Surgery

## 2023-08-28 ENCOUNTER — Encounter (HOSPITAL_BASED_OUTPATIENT_CLINIC_OR_DEPARTMENT_OTHER): Admission: RE | Payer: Self-pay | Source: Home / Self Care

## 2023-08-28 SURGERY — RELEASE, A1 PULLEY, FOR TRIGGER FINGER
Anesthesia: Choice | Site: Middle Finger | Laterality: Left

## 2023-10-20 ENCOUNTER — Other Ambulatory Visit: Payer: Self-pay | Admitting: Hematology and Oncology

## 2023-10-20 DIAGNOSIS — Z9189 Other specified personal risk factors, not elsewhere classified: Secondary | ICD-10-CM

## 2023-10-20 DIAGNOSIS — D241 Benign neoplasm of right breast: Secondary | ICD-10-CM

## 2023-10-22 ENCOUNTER — Ambulatory Visit: Payer: Self-pay

## 2023-10-22 ENCOUNTER — Other Ambulatory Visit: Payer: Self-pay

## 2023-10-22 NOTE — Telephone Encounter (Signed)
 FYI Only or Action Required?: FYI only for provider.  Patient was last seen in primary care on unknown.  Called Nurse Triage reporting Chest Pain.  Symptoms began yesterday.  Interventions attempted: Nothing.  Symptoms are: completely resolved.  Triage Disposition: See Physician Within 24 Hours  Patient/caregiver understands and will follow disposition?: Yes      Copied from CRM #[id number]. Topic: Clinical - Red Word Triage >> Oct 22, 2023  3:59 PM Ann Weiss wrote: Red Word that prompted transfer to Nurse Triage: experienced chest pain yesterday   ----------------------------------------------------------------------- From previous Reason for Contact - Scheduling: Patient/patient representative is calling to schedule an appointment. Refer to attachments for appointment information. Reason for Disposition  [1] Chest pain lasts > 5 minutes AND [2] occurred > 3 days ago (72 hours) AND [3] NO chest pain or cardiac symptoms now  Answer Assessment - Initial Assessment Questions 1. LOCATION: Where does it hurt?       R side of chest area  2. RADIATION: Does the pain go anywhere else? (e.g., into neck, jaw, arms, back)     Denies  3. ONSET: When did the chest pain begin? (Minutes, hours or days)      Yesterday morning around 1100  4. PATTERN: Does the pain come and go, or has it been constant since it started?  Does it get worse with exertion?      Constant pressure  5. DURATION: How long does it last (e.g., seconds, minutes, hours)     15 minutes  6. SEVERITY: How bad is the pain?  (e.g., Scale 1-10; mild, moderate, or severe)     Severe at time  7. CARDIAC RISK FACTORS: Do you have any history of heart problems or risk factors for heart disease? (e.g., angina, prior heart attack; diabetes, high blood pressure, high cholesterol, smoker, or strong family history of heart disease)     Denies  8. PULMONARY RISK FACTORS: Do you have any history of lung disease?   (e.g., blood clots in lung, asthma, emphysema, birth control pills)     Denies  9. CAUSE: What do you think is causing the chest pain?     Unsure  10. OTHER SYMPTOMS: Do you have any other symptoms? (e.g., dizziness, nausea, vomiting, sweating, fever, difficulty breathing, cough)       Vaginal burning and abnormal urine odor for over a month.    Patient unsure if pain was in her chest or her breast area. Patient states the pain has now resolved and has not returned. Patient seen by another provider yesterday to discuss these symptoms. Patient requesting to establish care with a [place] PCP.  Protocols used: Chest Pain-A-AH

## 2023-11-09 ENCOUNTER — Other Ambulatory Visit: Payer: Self-pay

## 2023-11-17 ENCOUNTER — Ambulatory Visit: Payer: No Typology Code available for payment source | Admitting: Hematology and Oncology

## 2023-11-20 ENCOUNTER — Inpatient Hospital Stay: Admission: RE | Admit: 2023-11-20 | Discharge: 2023-11-20 | Payer: Self-pay | Attending: Hematology and Oncology

## 2023-11-20 DIAGNOSIS — Z9189 Other specified personal risk factors, not elsewhere classified: Secondary | ICD-10-CM

## 2023-11-20 DIAGNOSIS — D241 Benign neoplasm of right breast: Secondary | ICD-10-CM

## 2023-11-20 MED ORDER — GADOPICLENOL 0.5 MMOL/ML IV SOLN
9.0000 mL | Freq: Once | INTRAVENOUS | Status: AC | PRN
  Administered 2023-11-20: 9 mL via INTRAVENOUS

## 2023-11-25 ENCOUNTER — Encounter: Payer: Self-pay | Admitting: General Practice

## 2023-11-25 DIAGNOSIS — I10 Essential (primary) hypertension: Secondary | ICD-10-CM | POA: Insufficient documentation

## 2023-11-25 DIAGNOSIS — E669 Obesity, unspecified: Secondary | ICD-10-CM | POA: Insufficient documentation

## 2023-11-25 DIAGNOSIS — F419 Anxiety disorder, unspecified: Secondary | ICD-10-CM | POA: Insufficient documentation

## 2023-11-26 ENCOUNTER — Ambulatory Visit: Payer: Self-pay | Admitting: General Practice

## 2023-11-26 ENCOUNTER — Encounter: Payer: Self-pay | Admitting: General Practice

## 2023-11-26 VITALS — BP 128/62 | HR 77 | Temp 97.7°F | Ht 67.0 in | Wt 192.0 lb

## 2023-11-26 DIAGNOSIS — Z23 Encounter for immunization: Secondary | ICD-10-CM

## 2023-11-26 DIAGNOSIS — K219 Gastro-esophageal reflux disease without esophagitis: Secondary | ICD-10-CM

## 2023-11-26 DIAGNOSIS — I10 Essential (primary) hypertension: Secondary | ICD-10-CM | POA: Diagnosis not present

## 2023-11-26 DIAGNOSIS — Z7984 Long term (current) use of oral hypoglycemic drugs: Secondary | ICD-10-CM | POA: Diagnosis not present

## 2023-11-26 DIAGNOSIS — E7800 Pure hypercholesterolemia, unspecified: Secondary | ICD-10-CM

## 2023-11-26 DIAGNOSIS — E785 Hyperlipidemia, unspecified: Secondary | ICD-10-CM

## 2023-11-26 DIAGNOSIS — E66811 Obesity, class 1: Secondary | ICD-10-CM | POA: Diagnosis not present

## 2023-11-26 DIAGNOSIS — R8290 Unspecified abnormal findings in urine: Secondary | ICD-10-CM

## 2023-11-26 DIAGNOSIS — E039 Hypothyroidism, unspecified: Secondary | ICD-10-CM | POA: Diagnosis not present

## 2023-11-26 DIAGNOSIS — E119 Type 2 diabetes mellitus without complications: Secondary | ICD-10-CM

## 2023-11-26 DIAGNOSIS — E6609 Other obesity due to excess calories: Secondary | ICD-10-CM | POA: Diagnosis not present

## 2023-11-26 DIAGNOSIS — Z6830 Body mass index (BMI) 30.0-30.9, adult: Secondary | ICD-10-CM | POA: Diagnosis not present

## 2023-11-26 DIAGNOSIS — N1831 Chronic kidney disease, stage 3a: Secondary | ICD-10-CM

## 2023-11-26 DIAGNOSIS — Z7689 Persons encountering health services in other specified circumstances: Secondary | ICD-10-CM | POA: Insufficient documentation

## 2023-11-26 LAB — BASIC METABOLIC PANEL WITH GFR
BUN: 15 mg/dL (ref 6–23)
CO2: 32 meq/L (ref 19–32)
Calcium: 9.5 mg/dL (ref 8.4–10.5)
Chloride: 100 meq/L (ref 96–112)
Creatinine, Ser: 1.18 mg/dL (ref 0.40–1.20)
GFR: 46.26 mL/min — ABNORMAL LOW (ref >60.00)
Glucose, Bld: 241 mg/dL — ABNORMAL HIGH (ref 70–99)
Potassium: 4.1 meq/L (ref 3.5–5.1)
Sodium: 138 meq/L (ref 135–145)

## 2023-11-26 LAB — TSH: TSH: 2.46 u[IU]/mL (ref 0.35–5.50)

## 2023-11-26 NOTE — Assessment & Plan Note (Signed)
 EMR reviewed briefly.

## 2023-11-26 NOTE — Progress Notes (Signed)
 New Patient Office Visit  Subjective    Patient ID: Mekhi Lascola, female    DOB: April 01, 1950  Age: 73 y.o. MRN: [id number]  CC:  Chief Complaint  Patient presents with   New Patient (Initial Visit)   urinary odor    Has had couple months. Patient has seen two other doctors to rule out any infections or yeast and has been cleared. Patient doesn't know why this has continued and wants to discuss.     HPI Annaliesa Blann is a 73 y.o. female presents to establish care.  Previous PCP/physical/labs: [facility]. January 2025  Discussed the use of AI scribe software for clinical note transcription with the patient, who gave verbal consent to proceed.  History of Present Illness Neviah Braud is a 73 year old female who presents with urinary odor.  She has been experiencing a persistent urinary odor for the past couple of months. Despite multiple UA and cultures, the cause remains undetermined. The odor is not fruity. Wet prep in October with obgyn was negative for BV or candida. Today she reports, no vaginal symptoms, urgency, frequency, or burning with urination, although there is a burning sensation that comes and goes. No visible blood in urine.   She has a history of hypertension and takes telmisartan with hydrochlorothiazide 80/25 mg once daily. Her home blood pressure readings are usually in the 120s/80s. She experienced a stroke in 2019 and takes baby aspirin .   She has a history of GERD and takes omeprazole  20 mg as needed. She does not avoid trigger foods such as tomatoes, fried, spicy, and greasy foods, which may exacerbate her symptoms.  She has type 2 diabetes and currently takes metformin  500 mg twice daily, which was reduced from 1000 mg twice daily by her previous doctor. Her last A1c in July was 7.4.   She has a history of high cholesterol and is on Crestor  40 mg daily. Her cholesterol was last checked in January. She reports not consistently taking her  Crestor  due to pill fatigue.  She has a history of hypothyroidism and takes levothyroxine  75 mcg daily, first thing in the morning. Her thyroid  levels were slightly high in July.  She recently discovered chronic kidney disease stage 3A in her patient portal, which was not previously communicated to her. Her GFR was noted to be 53 in July.     Outpatient Encounter Medications as of 11/26/2023  Medication Sig   amLODipine  (NORVASC ) 10 MG tablet Take 1 tablet (10 mg total) by mouth daily.   aspirin  EC 81 MG tablet Take 81 mg by mouth as needed.   glucose blood (ACCU-CHEK GUIDE TEST) test strip 1 each by Other route.   levothyroxine  (SYNTHROID , LEVOTHROID) 75 MCG tablet Take 75 mcg by mouth daily.   metFORMIN  (GLUCOPHAGE ) 1000 MG tablet take 1 tablet by mouth twice a day with food (Patient taking differently: 500 mg 2 (two) times daily with a meal. take 1 tablet by mouth twice a day with food)   omeprazole  (PRILOSEC) 20 MG capsule take 1 capsule by mouth once daily (Patient taking differently: take 1 capsule by mouth as needed)   rosuvastatin  (CRESTOR ) 40 MG tablet Take 1 tablet (40 mg total) by mouth daily.   telmisartan-hydrochlorothiazide (MICARDIS HCT) 80-25 MG tablet Take 1 tablet by mouth daily.   [DISCONTINUED] BAYER CONTOUR TEST test strip TEST BLOOD SUGARS 3 TIMES DAILY   No facility-administered encounter medications on file as of 11/26/2023.    Past  Medical History:  Diagnosis Date   Arthritis    Breast mass, right    Cataract    Chronic kidney disease    Constipation    Diabetes mellitus    GERD (gastroesophageal reflux disease)    Goiter    Hyperlipidemia    Hypertension    Hypothyroidism    Premature menopause age 75   Stroke (HCC)    sl weakness on right side-uses cane to walk   Vitamin D  deficiency 2009    Past Surgical History:  Procedure Laterality Date   BREAST BIOPSY Right 05/27/2019   HYALINIZED INTRADUCTAL PAPILLOMA    BREAST LUMPECTOMY WITH RADIOACTIVE  SEED LOCALIZATION Right 08/27/2019   Procedure: RIGHT BREAST LUMPECTOMY WITH RADIOACTIVE SEED LOCALIZATION;  Surgeon: Curvin Deward MOULD, MD;  Location: [facility];  Service: General;  Laterality: Right;   CESAREAN SECTION     x 2   COLONOSCOPY  [id number]   ESOPHAGOGASTRODUODENOSCOPY  [id number]   FOOT SURGERY Right 01/2013   bone spur   TUBAL LIGATION      Family History  Problem Relation Age of Onset   Hypertension Mother    Kidney disease Mother        renal failure, dialysis   Heart disease Mother    Hypertension Father    Stroke Father    Heart disease Father    Arthritis Maternal Aunt    Heart disease Maternal Aunt    Diabetes Maternal Grandmother    Arthritis Maternal Grandmother    Cancer Neg Hx    Breast cancer Neg Hx    Colon cancer Neg Hx     Social History   Socioeconomic History   Marital status: Married    Spouse name: Christopher   Number of children: 2   Years of education: Not on file   Highest education level: Bachelor's degree (e.g., BA, AB, BS)  Occupational History   Occupation: Retired from [employer]: RETIRED  Tobacco Use   Smoking status: Never   Smokeless tobacco: Never  Vaping Use   Vaping status: Never Used  Substance and Sexual Activity   Alcohol use: Yes    Alcohol/week: 5.0 standard drinks of alcohol    Types: 5 Glasses of wine per week    Comment: socially maybe 1-2 times monthly   Drug use: No   Sexual activity: Yes  Other Topics Concern   Not on file  Social History Narrative    Daughter lives in [place] and other daughter lives in [place], [place]. 4 grandchildren      Patient is right-handed. She lives with her husband in a two level home. She drinks 2 cups of coffee a day, she does not exercise..   Social Drivers of Health   Financial Resource Strain: Low Risk  (11/22/2023)   Overall Financial Resource Strain (CARDIA)    Difficulty of Paying Living Expenses: Not hard at all  Food Insecurity: No Food Insecurity (11/22/2023)    Hunger Vital Sign    Worried About Running Out of Food in the Last Year: Never true    Ran Out of Food in the Last Year: Never true  Transportation Needs: No Transportation Needs (11/22/2023)   PRAPARE - Administrator, Civil Service (Medical): No    Lack of Transportation (Non-Medical): No  Physical Activity: Insufficiently Active (11/22/2023)   Exercise Vital Sign    Days of Exercise per Week: 2 days    Minutes of Exercise per Session: 20  min  Stress: No Stress Concern Present (11/22/2023)   [employer] of Occupational Health - Occupational Stress Questionnaire    Feeling of Stress: Not at all  Social Connections: Socially Integrated (11/22/2023)   Social Connection and Isolation Panel    Frequency of Communication with Friends and Family: More than three times a week    Frequency of Social Gatherings with Friends and Family: Twice a week    Attends Religious Services: More than 4 times per year    Active Member of [employer] or Organizations: Yes    Attends Engineer, Structural: More than 4 times per year    Marital Status: Married  Catering Manager Violence: Not on file    Review of Systems  Constitutional:  Negative for chills and fever.  Respiratory:  Negative for shortness of breath.   Cardiovascular:  Negative for chest pain.  Gastrointestinal:  Negative for abdominal pain, constipation, diarrhea, heartburn, nausea and vomiting.  Genitourinary:  Negative for dysuria, frequency and urgency.  Neurological:  Negative for dizziness and headaches.  Endo/Heme/Allergies:  Negative for polydipsia.  Psychiatric/Behavioral:  Negative for depression and suicidal ideas. The patient is not nervous/anxious.         Objective    BP 128/62   Pulse 77   Temp 97.7 F (36.5 C) (Temporal)   Ht 5' 7 (1.702 m)   Wt 192 lb (87.1 kg)   SpO2 96%   BMI 30.07 kg/m   Physical Exam Vitals and nursing note reviewed.  Constitutional:      Appearance: Normal  appearance.  Cardiovascular:     Rate and Rhythm: Normal rate and regular rhythm.     Pulses: Normal pulses.     Heart sounds: Normal heart sounds.  Pulmonary:     Effort: Pulmonary effort is normal.     Breath sounds: Normal breath sounds.  Neurological:     Mental Status: She is alert and oriented to person, place, and time.  Psychiatric:        Mood and Affect: Mood normal.        Behavior: Behavior normal.        Thought Content: Thought content normal.        Judgment: Judgment normal.         Assessment & Plan:  Abnormal urine odor -     Ambulatory referral to Urology  Establishing care with new doctor, encounter for Assessment & Plan: EMR reviewed briefly.     Stage 3a chronic kidney disease (HCC)  Primary hypertension  Hypothyroidism, unspecified type -     TSH  Controlled type 2 diabetes mellitus without complication (HCC) -     Hemoglobin A1c -     Basic metabolic panel with GFR  Need for influenza vaccination -     Flu vaccine HIGH DOSE PF(Fluzone Trivalent)  Gastroesophageal reflux disease, unspecified whether esophagitis present  Pure hypercholesterolemia  Class 1 obesity due to excess calories with serious comorbidity and body mass index (BMI) of 30.0 to 30.9 in adult Assessment & Plan: Discussed the importance of healthy diet and exercise to affect sustainable weight loss.       Assessment and Plan Assessment & Plan Unspecified abnormal findings in urine Persistent urinary odor with negative evaluations for bacterial vaginosis and yeast.  - Referred to urologist for further evaluation.  Chronic kidney disease, stage 3a CKD stage 3a with GFR of 53. Common in controlled diabetics. Metformin  dosage adjusted due to kidney function. - BMP pending.  -  Continue regular monitoring of kidney function.  Type 2 diabetes mellitus Controlled diabetes with A1c of 7.4. Metformin  adjusted to 500 mg twice daily. Discussed injectable medication,  preference for oral medication noted. - Continue metformin  500 mg twice daily. - Encouraged better diabetes control to potentially improve urinary odor. - A1c pending.   Essential hypertension Well-controlled hypertension with consistent home and office readings. - Continue telmisartan with hydrochlorothiazide 80/25 mg once daily.  Hypothyroidism Managed with levothyroxine  75 mcg daily. Previous TSH slightly elevated. Discussed proper timing for medication absorption. -TSH pending.  - Instructed to take levothyroxine  on an empty stomach, at least 60 minutes before other medications.  Hyperlipidemia Non-adherence to Crestor  due to pill burden. Discussed importance of statin therapy due to diabetes and stroke history. - Encouraged adherence to Crestor  40 mg daily. - Lipid panel at next visit.   Gastroesophageal reflux disease GERD managed with omeprazole  20 mg as needed. Does not avoid trigger foods. - Continue omeprazole  20 mg as needed. - Discussed dietary modifications to avoid trigger foods.   Return in about 2 months (around 02/02/2024) for physical and fasting labs. SABRA Carrol Aurora, NP

## 2023-11-26 NOTE — Assessment & Plan Note (Signed)
 Discussed the importance of healthy diet and exercise to affect sustainable weight loss.

## 2023-11-26 NOTE — Patient Instructions (Addendum)
 Stop by the lab prior to leaving today. I will notify you of your results once received.    Be sure to take your levothyroxine  (thyroid  medication) every morning on an empty stomach with water only.   No food or other medications for 60 minutes.   No heartburn medication, iron pills, calcium , vitamin D , or magnesium pills within four hours of taking levothyroxine .    You will either be contacted via phone regarding your referral to urology , or you may receive a letter on your MyChart portal from our referral team with instructions for scheduling an appointment. Please let us  know if you have not been contacted by anyone within two weeks.   Follow up in 2 months.   It was a pleasure to meet you today! Please don't hesitate to contact me with any questions. Welcome to [facility]!

## 2023-11-27 LAB — HEMOGLOBIN A1C: Hgb A1c MFr Bld: 7.6 % — ABNORMAL HIGH (ref 4.6–6.5)

## 2023-11-28 ENCOUNTER — Ambulatory Visit: Payer: Self-pay | Admitting: General Practice

## 2023-11-28 DIAGNOSIS — N1831 Chronic kidney disease, stage 3a: Secondary | ICD-10-CM

## 2023-11-28 DIAGNOSIS — E119 Type 2 diabetes mellitus without complications: Secondary | ICD-10-CM

## 2023-11-28 DIAGNOSIS — E118 Type 2 diabetes mellitus with unspecified complications: Secondary | ICD-10-CM

## 2023-12-01 ENCOUNTER — Other Ambulatory Visit: Payer: Self-pay | Admitting: General Practice

## 2023-12-01 DIAGNOSIS — E118 Type 2 diabetes mellitus with unspecified complications: Secondary | ICD-10-CM

## 2023-12-01 MED ORDER — RYBELSUS 3 MG PO TABS: 3.0000 mg | ORAL_TABLET | Freq: Every day | ORAL | 2 refills | Status: DC

## 2023-12-02 ENCOUNTER — Inpatient Hospital Stay: Payer: Self-pay | Attending: Hematology and Oncology | Admitting: Hematology and Oncology

## 2023-12-02 VITALS — BP 145/68 | HR 74 | Temp 98.0°F | Resp 18 | Ht 67.0 in | Wt 191.7 lb

## 2023-12-02 DIAGNOSIS — D241 Benign neoplasm of right breast: Secondary | ICD-10-CM | POA: Diagnosis present

## 2023-12-02 MED ORDER — EMPAGLIFLOZIN 10 MG PO TABS: 10.0000 mg | ORAL_TABLET | Freq: Every day | ORAL | 0 refills | Status: DC

## 2023-12-02 NOTE — Assessment & Plan Note (Signed)
 05/26/2019: Right breast biopsy LOQ: Hyalinized intraductal papilloma with calcifications, atypical ductal hyperplasia. 08/27/2019: Right lumpectomy: Hyalinized intraductal papilloma.  Usual ductal epithelial hyperplasia.   Tyrer Cusick risk: 1.  10-year risk: 13.5% (average of 3.5%) 2. lifetime risk: 20.5% (average risk 6%)   Breast cancer surveillance: 1.  Annual mammograms: 07/14/2023: Benign, breast density category B 2. breast MRIs periodically once every 3 years or so.  Breast MRI at 11/20/2023: Benign breast density category B Next MRI will be done in 2028 3.  MRI lumbar spine: Spinal stenosis, degenerative changes 4.  MRI brain 10/23/2022 (ordered by neurology) no evidence of metastatic disease   Telephone visit every 2 years

## 2023-12-02 NOTE — Addendum Note (Signed)
 Addended by: VINCENTE SHIVERS on: 12/02/2023 10:52 AM   Modules accepted: Orders

## 2023-12-02 NOTE — Progress Notes (Signed)
 Patient Care Team: Vincente Shivers, NP as PCP - General (General Practice) Skeet Juliene SAUNDERS, DO as Consulting Physician (Neurology)  DIAGNOSIS:  Encounter Diagnosis  Name Primary?   Intraductal papilloma of right breast Yes      CHIEF COMPLIANT: Surveillance of breast cancer being high risk  HISTORY OF PRESENT ILLNESS:   History of Present Illness Ann Weiss is a 73 year old female who presents for routine follow-up regarding breast cancer screening.  Her recent breast MRI on November 13th was normal, and her mammograms have consistently been normal. She denies any lumps or nodules in the breast.    ALLERGIES:  is allergic to ciprofloxacin.  MEDICATIONS:  Current Outpatient Medications  Medication Sig Dispense Refill   amLODipine  (NORVASC ) 10 MG tablet Take 1 tablet (10 mg total) by mouth daily. 30 tablet 5   aspirin  EC 81 MG tablet Take 81 mg by mouth as needed.     glucose blood (ACCU-CHEK GUIDE TEST) test strip 1 each by Other route.     levothyroxine  (SYNTHROID , LEVOTHROID) 75 MCG tablet Take 75 mcg by mouth daily.  3   metFORMIN  (GLUCOPHAGE ) 1000 MG tablet take 1 tablet by mouth twice a day with food 60 tablet 5   omeprazole  (PRILOSEC) 20 MG capsule take 1 capsule by mouth once daily 30 capsule 11   telmisartan-hydrochlorothiazide (MICARDIS HCT) 80-25 MG tablet Take 1 tablet by mouth daily.  6   empagliflozin  (JARDIANCE ) 10 MG TABS tablet Take 1 tablet (10 mg total) by mouth daily. (Patient not taking: Reported on 12/02/2023) 90 tablet 0   rosuvastatin  (CRESTOR ) 40 MG tablet Take 1 tablet (40 mg total) by mouth daily. (Patient not taking: Reported on 12/02/2023) 90 tablet 1   No current facility-administered medications for this visit.    PHYSICAL EXAMINATION: ECOG PERFORMANCE STATUS: 1 - Symptomatic but completely ambulatory  Vitals:   12/02/23 1138  BP: (!) 145/68  Pulse: 74  Resp: 18  Temp: 98 F (36.7 C)  SpO2: 98%   Filed Weights   12/02/23 1138   Weight: 191 lb 11.2 oz (87 kg)    Physical Exam   (exam performed in the presence of a chaperone)  LABORATORY DATA:  I have reviewed the data as listed    Latest Ref Rng & Units 11/26/2023   12:10 PM 07/30/2023   12:00 AM 08/24/2019   11:00 AM  CMP  Glucose 70 - 99 mg/dL 758   708   BUN 6 - 23 mg/dL 15  19     13    Creatinine 0.40 - 1.20 mg/dL 8.81  1.1     8.92   Sodium 135 - 145 mEq/L 138  140     138   Potassium 3.5 - 5.1 mEq/L 4.1  4.1     3.9   Chloride 96 - 112 mEq/L 100  102     101   CO2 19 - 32 mEq/L 32  31     27   Calcium  8.4 - 10.5 mg/dL 9.5  9.8     9.4   AST 13 - 35  13       ALT 7 - 35 U/L  5          This result is from an external source.    Lab Results  Component Value Date   WBC 7.3 07/30/2023   HGB 13.4 07/30/2023   HCT 39 07/30/2023   MCV 91.4 04/01/2019   PLT 220 07/30/2023  NEUTROABS 6.2 04/01/2019    ASSESSMENT & PLAN:  Intraductal papilloma of right breast 05/26/2019: Right breast biopsy LOQ: Hyalinized intraductal papilloma with calcifications, atypical ductal hyperplasia. 08/27/2019: Right lumpectomy: Hyalinized intraductal papilloma.  Usual ductal epithelial hyperplasia.   Tyrer Cusick risk: 1.  10-year risk: 13.5% (average of 3.5%) 2. lifetime risk: 20.5% (average risk 6%)   Breast cancer surveillance: 1.  Annual mammograms: 07/14/2023: Benign, breast density category B 2. breast MRIs periodically once every 3 years or so.  Breast MRI at 11/20/2023: Benign breast density category B Next MRI will be done in 2028 3.  MRI lumbar spine: Spinal stenosis, degenerative changes 4.  MRI brain 10/23/2022 (ordered by neurology) no evidence of metastatic disease   Follow-up visit every 2 years  Assessment & Plan Breast cancer surveillance in patient with history of intraductal papilloma and high risk for breast cancer Recent breast MRI normal. Mammograms consistently good with decreased breast density, improving detection accuracy. - Continue  mammograms every two years. - Schedule breast MRI every three years. - Next mammogram due in July. - Contact provider if concerns arise before next imaging.      No orders of the defined types were placed in this encounter.  The patient has a good understanding of the overall plan. she agrees with it. she will call with any problems that may develop before the next visit here.  I personally spent a total of 30 minutes in the care of the patient today including preparing to see the patient, getting/reviewing separately obtained history, performing a medically appropriate exam/evaluation, counseling and educating, placing orders, referring and communicating with other health care professionals, documenting clinical information in the EHR, independently interpreting results, communicating results, and coordinating care.   Viinay K Winnifred Dufford, MD 12/02/23

## 2023-12-16 MED ORDER — METFORMIN HCL 1000 MG PO TABS: ORAL_TABLET | ORAL | 2 refills | Status: AC

## 2023-12-16 NOTE — Addendum Note (Signed)
 Addended by: VINCENTE SHIVERS on: 12/16/2023 04:05 PM   Modules accepted: Orders

## 2023-12-22 ENCOUNTER — Encounter: Payer: Self-pay | Admitting: Neurology

## 2023-12-23 ENCOUNTER — Telehealth: Payer: Self-pay

## 2023-12-23 NOTE — Progress Notes (Signed)
 Complex Care Management Note Care Guide Note  12/23/2023 Name: Ann Weiss MRN: [id number] DOB: 1950-01-29   Complex Care Management Outreach Attempts: An unsuccessful telephone outreach was attempted today to offer the patient information about available complex care management services.  Follow Up Plan:  Additional outreach attempts will be made to offer the patient complex care management information and services.   Encounter Outcome:  No Answer  Dreama [facility]wood [facility]  [facility], [facility] VBCI Assistant Direct Dial: [phone]  Fax: [phone]

## 2023-12-23 NOTE — Telephone Encounter (Signed)
 Copied from CRM #[id number]. Topic: General - Other >> Dec 23, 2023  4:27 PM Aisha D wrote: Reason for CRM: [facility] stated that a referral was faxed to them for the pt but they are unable to process the referral due to missing information. She stated that they are missing the pt's demographics and needs to have that faxed over as soon as possible. Fax:[phone].

## 2023-12-23 NOTE — Progress Notes (Signed)
 Complex Care Management Note  Care Guide Note 12/23/2023 Name: Ann Weiss MRN: [id number] DOB: 1950/03/02  Ann Weiss is a 73 y.o. year old female who sees Vincente Shivers, NP for primary care. I reached out to Barnie Jenkins Satchel by phone today to offer complex care management services.  Ann Weiss was given information about Complex Care Management services today including:   The Complex Care Management services include support from the care team which includes your Nurse Care Manager, Clinical Social Worker, or Pharmacist.  The Complex Care Management team is here to help remove barriers to the health concerns and goals most important to you. Complex Care Management services are voluntary, and the patient may decline or stop services at any time by request to their care team member.   Complex Care Management Consent Status: Patient agreed to services and verbal consent obtained.   Follow up plan:  Telephone appointment with complex care management team member scheduled for:  12/25/23 at 12:30 p.m.   Encounter Outcome:  Patient Scheduled  Dreama Lynwood [facility]  [facility], [facility] VBCI Assistant Direct Dial: [phone]  Fax: [phone]

## 2023-12-24 NOTE — Telephone Encounter (Signed)
 I will get this refaxed to their office.

## 2023-12-25 ENCOUNTER — Other Ambulatory Visit

## 2023-12-25 DIAGNOSIS — E7800 Pure hypercholesterolemia, unspecified: Secondary | ICD-10-CM

## 2023-12-25 DIAGNOSIS — E119 Type 2 diabetes mellitus without complications: Secondary | ICD-10-CM

## 2023-12-25 NOTE — Patient Instructions (Signed)
 Ms. Deserie Dirks,   It was a pleasure to speak with you today! As we discussed:?   The Jardiance  savings card may possibly work once your insurance un-enrolls you from that Script program though keep us  posted if you continue to have questions.  There are a few medicines in the same drug class as Jardiance  meaning they work in the same way within the body. One of these medicines is called Brenzavvy and is available through a pharmacy that helps supply patients with cheaper cash-prices.  [facility].      Please reach out prior to your next scheduled appointment should you have any questions or concerns.  You may reach me via MyChart Message, or you may leave me a voicemail at [phone] and I will get back to you shortly.   Thank you!   Future Appointments  Date Time Provider Department Center  01/12/2024  1:15 PM Twylla Glendia BROCKS, MD [facility] None  02/02/2024 11:20 AM Vincente Shivers, NP [facility] [id number]  04/07/2024  2:00 PM Rosemarie Eather RAMAN, MD [facility] None    Manuelita FABIENE Kobs, PharmD Clinical Pharmacist [facility]    [place], [place]  [phone]

## 2023-12-25 NOTE — Progress Notes (Unsigned)
 12/25/2023 Name: Ann Weiss MRN: [id number] DOB: June 25, 1950  Subjective  No chief complaint on file.   Care Team: Primary Care Provider: Vincente Shivers, NP  Reason for visit: ?  Ann Weiss is a 73 y.o. female who presents today for a telephone visit with the pharmacist due to medication access concerns regarding their ***. ?   Medication Access: ?  Reports that all medications are not affordable. Has been having a difficult time getting Jardiance  due to insurance issues.  I am having a hard time getting my prescription filled for Jardiance . The pharmacy is saying that my insurance will not pay for it because it is covered under a Medicare Part D which I am NOT covered under (it appears that [employer] automatically enrolled their members in a SCRIPT Medicare program of which I was unaware. I am diligently working to get this resolved so I can get the Jardiance ).   Patient has since called insurance and requested to remove the Medicare optional benefit and is awaiting paperwork in the mail confirming this change.   Notes she took Jardiance  a few years ago though had issues with yeast infections and stopped the Jardiance  at that time. Before, was paying $54/month which is more feasible. Through the Medicare plan she is in the process of canceling, cost was $150/month. ____________________________________________________________________________  Insurance Benefit [email or web address].pdf Medicare Advantage Opportunity The [place].H[place]A High Medicare Advantage Plan and the [place].[employer].H.A Standard Medicare Advantage Plan enhance your coverage by reducing or eliminating cost-sharing for services and adding benefits at no additional cost. Members may opt in or out of the Plan at any time.   Assessment and Plan:    1. Medication Access Next steps will depend on insurance  status. Patient has called her insurance to opt out of the Medicare benefit on her plan. Unclear how this will affect her cost. May allow her to use the commercial savings card (though technically a government-affiliated insurance which may prohibit use of any savings cards).  Similar to above, likely PAP ineligible given government-sponsored health plan.   Considerations:  If Savings card to not work with her kerr-mcgee, consider alternative SGLT2i Arboriculturist) given availability through savings pharmacy for $50/month. Will discuss with PCP. No current CHF or proteinuria. Diabetes uncontrolled.  Brenzavvy (bexagliflozin) is a cheaper SGLT2i therapy (~$50/month or ~$140/76month via [facility]). There is limited clinical data specifically comparing Brenzavvy to other SGLT2i in terms of A1c-lowering efficacy so superiority cannot be determined (though Brenzavvy was effective in reducing A1c compared ot placebo in clinica trials). The major difference at this time is lack of established indication at this time for the use of Brenzavvy specifically for heart failure and CV risk reduction though some believe that this is likely an SGLT2i class effect.    2. Chronic conditions: Per patient questions, we reviewed chronic conditions including CKD, DM2, ASCVD (hx stroke) as well as cardiorenal risk reduction strategies including RAASi, SGLT2i, and Lipid, BP and BG control, etc. Patient notes she had stopped taking her statin a while back as she did not feel well-informed of all of her labs/diagnosis but does want to resume rosuvastatin .  Refill consideration sent to PCP - [facility]atin  [facility] [place] [facility] patient request.    Future Appointments  Date Time Provider Department Center  01/12/2024  1:15 PM Twylla Glendia BROCKS, MD [facility] None  02/02/2024 11:20 AM Vincente Shivers, NP [facility] [id number]  04/07/2024  2:00 PM Rosemarie Eather RAMAN, MD [facility] None  Manuelita FABIENE Kobs, PharmD Clinical  Pharmacist [facility] [phone]

## 2023-12-26 ENCOUNTER — Telehealth: Payer: Self-pay

## 2023-12-26 ENCOUNTER — Telehealth (HOSPITAL_COMMUNITY): Payer: Self-pay

## 2023-12-26 ENCOUNTER — Other Ambulatory Visit (HOSPITAL_COMMUNITY): Payer: Self-pay

## 2023-12-26 MED ORDER — ROSUVASTATIN CALCIUM 40 MG PO TABS: 40.0000 mg | ORAL_TABLET | Freq: Every day | ORAL | 2 refills | Status: AC

## 2023-12-26 NOTE — Telephone Encounter (Signed)
 Pharmacy Patient Advocate Encounter  Insurance verification completed.   The patient is insured through [facility]   Ran test claim for Jardiance . Currently a quantity of 90 is a 90 day supply and the co-pay is 453.12 . The current 30 day co-pay is, $150.99.  No PA needed at this time.  This test claim was processed through [facility]- copay amounts may vary at other pharmacies due to pharmacy/plan contracts, or as the patient moves through the different stages of their insurance plan.

## 2024-01-12 ENCOUNTER — Ambulatory Visit: Admitting: Urology

## 2024-01-12 ENCOUNTER — Encounter: Payer: Self-pay | Admitting: Urology

## 2024-01-12 VITALS — BP 148/78 | HR 64 | Ht 67.0 in | Wt 190.0 lb

## 2024-01-12 DIAGNOSIS — N898 Other specified noninflammatory disorders of vagina: Secondary | ICD-10-CM

## 2024-01-12 DIAGNOSIS — E119 Type 2 diabetes mellitus without complications: Secondary | ICD-10-CM

## 2024-01-12 DIAGNOSIS — R8290 Unspecified abnormal findings in urine: Secondary | ICD-10-CM

## 2024-01-12 MED ORDER — ESTRADIOL 0.01 % VA CREA: TOPICAL_CREAM | VAGINAL | 12 refills | Status: AC

## 2024-01-12 MED ORDER — BEXAGLIFLOZIN 20 MG PO TABS: 1.0000 | ORAL_TABLET | Freq: Every morning | ORAL | 2 refills | Status: AC

## 2024-01-12 NOTE — Progress Notes (Deleted)
" ° °  01/12/2024 2:54 PM   Ann Weiss 12-Apr-1950 [id number]   HPI: 74 y.o. female here for initial evaluation of abnormal urine smell.   The patient reports a four month history of abnormal urinary smell accompanied by burning with urination.    PMH: Past Medical History:  Diagnosis Date   Arthritis    Breast mass, right    Cataract    Chronic kidney disease    Constipation    Diabetes mellitus    GERD (gastroesophageal reflux disease)    Goiter    Hyperlipidemia    Hypertension    Hypothyroidism    Premature menopause age 87   Stroke (HCC)    sl weakness on right side-uses cane to walk   Vitamin D  deficiency 2009    Surgical History: Past Surgical History:  Procedure Laterality Date   BREAST BIOPSY Right 05/27/2019   HYALINIZED INTRADUCTAL PAPILLOMA    BREAST LUMPECTOMY WITH RADIOACTIVE SEED LOCALIZATION Right 08/27/2019   Procedure: RIGHT BREAST LUMPECTOMY WITH RADIOACTIVE SEED LOCALIZATION;  Surgeon: Curvin Deward MOULD, MD;  Location: [facility];  Service: General;  Laterality: Right;   CESAREAN SECTION     x 2   COLONOSCOPY  [id number]   ESOPHAGOGASTRODUODENOSCOPY  [id number]   FOOT SURGERY Right 01/2013   bone spur   TUBAL LIGATION      Family History: Family History  Problem Relation Age of Onset   Hypertension Mother    Kidney disease Mother        renal failure, dialysis   Heart disease Mother    Hypertension Father    Stroke Father    Heart disease Father    Arthritis Maternal Aunt    Heart disease Maternal Aunt    Diabetes Maternal Grandmother    Arthritis Maternal Grandmother    Cancer Neg Hx    Breast cancer Neg Hx    Colon cancer Neg Hx     Social History:  reports that she has never smoked. She has never used smokeless tobacco. She reports current alcohol use of about 5.0 standard drinks of alcohol per week. She reports that she does not use drugs.      Physical Exam: BP (!) 148/78   Pulse 64   Ht 5' 7 (1.702 m)   Wt 190  lb (86.2 kg)   BMI 29.76 kg/m    Constitutional:  Alert and oriented, No acute distress. Cardiovascular: No clubbing, cyanosis, or edema. Respiratory: Normal respiratory effort, no increased work of breathing. Skin: No rashes, bruises or suspicious lesions. Neurologic: Grossly intact, no focal deficits, moving all 4 extremities. Psychiatric: Normal mood and affect.  Laboratory Data: ***  Assessment & Plan:    Abnormal urine odor -  Urinalysis, Complete - estradiol  (ESTRACE ) 0.01% vaginal cream.  - I explained that topical vaginal estrogen has multiple mechanisms of action to reduce the frequency of UTI including bulking up the urogenital tissue, increasing self-lubrication, acidifying the urogenital environment, and rebalancing the microbiome. Additionally, I explained that topical vaginal estrogen is not expected to be associated with systemic hormone absorption, and it has not been shown to increase the risk for heart attack, stroke, or other clots.  - RTC in 2 months for follow-up     Marry Giovanni RIGGERS 01/12/2024  [facility] [address], [address] [place], [place] [address] [phone]  "

## 2024-01-12 NOTE — Progress Notes (Signed)
 After discussion w PCP, Brenzavvy  prescription pended to [facility] per discussion on 12/25/23. eGFR 46, lytles WNL on 11/26/23. BP elevated on average, still no concerns w starting SGLT2i.   Brenzavvy  (bexagliflozin ) 20 mg tab po each morning (1 month supply). 2RF.   Labs ideal ~4 weeks after SGLT2i initiation (BMP).  Has PCP f/u 3 weeks which is reasonable for repeat BMP assuming medication is started some time this week.  Otherwise, could arrange for lab-only visit vs [facility] order close to patient's home.   Follow up:  Reminder set for end of week, ensure patient has been contacted by [facility] (expect text/email within next couple of days)

## 2024-01-12 NOTE — Progress Notes (Unsigned)
 "    01/12/2024 4:34 PM   Barnie Jenkins Satchel 1950/04/22 [id number]  Referring provider: Vincente Shivers, NP [address] [place],  [place] [address]  Urological history: 1. 09/23/23: initially seen by PCP for urinary symptoms. Urine dip w/reflex and urine culture ordered. Urine dip showed positive nitrites and 1+ ketones. Urine culture positive for E. Coli. 2. 10/22/23: seen by OB/GYN for dysuria, abnormal urinary odor, and vaginal discharge. Wet mount performed. Negative for BV and candida. WBC slightly increased.  3. 11/26/23: seen by new PCP for persistent urinary odor. Referral for urology placed for further evaluation.   Chief Complaint  Patient presents with   Abnormal urine odor    HPI: Christeena Krogh is a 74 y.o. female who presents today for abnormal urinary odor.   The patient reports a four month history of abnormal urinary smell accompanied by burning with urination. She states her urine smells fishy. The patient was initially seen by her previous PCP on 09/23/23 for urinary odor. Urine culture was positive for E. Coli. She was prescribed Nitrofurantoin 100 MG. She reports symptom resolution with antibiotics.   She was seen by her OB/GYN on 10/22/23 for the persistent urinary odor. Wet prep was performed which was negative for BV and candida. She most recently was seen by her new PCP for ongoing symptoms on 11/26/23. Her PCP determined that with the persistent urinary odor and negative evaluation for BV and yeast that a referral to urology for further workup would be best.   She continues to have urinary odor. Burning sensation is intermittent. She reports some discomfort with sexual intercourse, but attributes the discomfort to vaginal dryness. She denies vaginal discharge or bleeding.   Today she denies urinary frequency, urgency, abdominal pain, burning with urination, or visible blood in urine.    Previous records reviewed.   PMH: Past Medical History:   Diagnosis Date   Arthritis    Breast mass, right    Cataract    Chronic kidney disease    Constipation    Diabetes mellitus    GERD (gastroesophageal reflux disease)    Goiter    Hyperlipidemia    Hypertension    Hypothyroidism    Premature menopause age 47   Stroke (HCC)    sl weakness on right side-uses cane to walk   Vitamin D  deficiency 2009    Surgical History: Past Surgical History:  Procedure Laterality Date   BREAST BIOPSY Right 05/27/2019   HYALINIZED INTRADUCTAL PAPILLOMA    BREAST LUMPECTOMY WITH RADIOACTIVE SEED LOCALIZATION Right 08/27/2019   Procedure: RIGHT BREAST LUMPECTOMY WITH RADIOACTIVE SEED LOCALIZATION;  Surgeon: Curvin Deward MOULD, MD;  Location: [facility];  Service: General;  Laterality: Right;   CESAREAN SECTION     x 2   COLONOSCOPY  [id number]   ESOPHAGOGASTRODUODENOSCOPY  [id number]   FOOT SURGERY Right 01/2013   bone spur   TUBAL LIGATION      Home Medications:  Allergies as of 01/12/2024       Reactions   Ciprofloxacin         Medication List        Accurate as of January 12, 2024  4:34 PM. If you have any questions, ask your nurse or doctor.          STOP taking these medications    empagliflozin  10 MG Tabs tablet Commonly known as: JARDIANCE  Stopped by: Glendia Barba, MD       TAKE these medications  Accu-Chek Guide Test test strip Generic drug: glucose blood 1 each by Other route.   amLODipine  10 MG tablet Commonly known as: NORVASC  Take 1 tablet (10 mg total) by mouth daily.   aspirin  EC 81 MG tablet Take 81 mg by mouth as needed.   Bexagliflozin  20 MG Tabs Commonly known as: Brenzavvy  Take 1 tablet by mouth in the morning. [email or web address]  Cell [phone] Started by: MANUELITA JONELLE KOBS   estradiol  0.01 % Crea vaginal cream Commonly known as: ESTRACE  Apply one pea-sized amount around the opening of the urethra daily for 2 weeks, then 3 times weekly moving forward. Started by: Glendia Barba,  MD   levothyroxine  75 MCG tablet Commonly known as: SYNTHROID  Take 75 mcg by mouth daily.   metFORMIN  1000 MG tablet Commonly known as: GLUCOPHAGE  take 1 tablet by mouth twice a day with food   omeprazole  20 MG capsule Commonly known as: PRILOSEC take 1 capsule by mouth once daily   rosuvastatin  40 MG tablet Commonly known as: CRESTOR  Take 1 tablet (40 mg total) by mouth daily.   telmisartan-hydrochlorothiazide 80-25 MG tablet Commonly known as: MICARDIS HCT Take 1 tablet by mouth daily.        Allergies: Allergies[1]  Family History: Family History  Problem Relation Age of Onset   Hypertension Mother    Kidney disease Mother        renal failure, dialysis   Heart disease Mother    Hypertension Father    Stroke Father    Heart disease Father    Arthritis Maternal Aunt    Heart disease Maternal Aunt    Diabetes Maternal Grandmother    Arthritis Maternal Grandmother    Cancer Neg Hx    Breast cancer Neg Hx    Colon cancer Neg Hx     Social History:  reports that she has never smoked. She has never used smokeless tobacco. She reports current alcohol use of about 5.0 standard drinks of alcohol per week. She reports that she does not use drugs.  ROS: Pertinent ROS in HPI  Physical Exam: BP (!) 148/78   Pulse 64   Ht 5' 7 (1.702 m)   Wt 190 lb (86.2 kg)   BMI 29.76 kg/m   Constitutional:  Well nourished. Alert and oriented, No acute distress. HEENT: [place] AT, moist mucus membranes.  Cardiovascular: No clubbing, cyanosis, or edema. Respiratory: Normal respiratory effort, no increased work of breathing. GI: Abdomen is soft, non tender, non distended, no abdominal masses.  GU: No CVA tenderness.  No bladder fullness or masses.   Skin: No rashes, bruises or suspicious lesions. Neurologic: Grossly intact, no focal deficits, moving all 4 extremities. Psychiatric: Normal mood and affect.  Laboratory Data:   Lab Results  Component Value Date   WBC 7.3  07/30/2023   HGB 13.4 07/30/2023   HCT 39 07/30/2023   MCV 91.4 04/01/2019   PLT 220 07/30/2023    Lab Results  Component Value Date   CREATININE 1.18 11/26/2023    Lab Results  Component Value Date   TESTOSTERONE  5.39 07/30/2023    Lab Results  Component Value Date   HGBA1C 7.6 (H) 11/26/2023    Urinalysis    Component Value Date/Time   COLORURINE YELLOW 04/01/2019 2114   APPEARANCEUR CLEAR 04/01/2019 2114   LABSPEC 1.015 04/01/2019 2114   PHURINE 5.0 04/01/2019 2114   GLUCOSEU NEGATIVE 04/01/2019 2114   HGBUR NEGATIVE 04/01/2019 2114   BILIRUBINUR NEGATIVE 04/01/2019 2114   BILIRUBINUR neg 06/27/2014 1245  KETONESUR 5 (A) 04/01/2019 2114   PROTEINUR NEGATIVE 04/01/2019 2114   UROBILINOGEN 0.2 08/03/2014 1822   NITRITE NEGATIVE 04/01/2019 2114   LEUKOCYTESUR TRACE (A) 04/01/2019 2114    Lab Results  Component Value Date   BACTERIA RARE (A) 04/01/2019     Assessment & Plan:   Noami Bove is a 74 y.o. female who presents today for persistent abnormal urinary odor.   1. Abnormal urine odor (Primary) - Urinalysis, Complete - estradiol  (ESTRACE ) 0.01% vaginal cream. Start vaginal estrogen cream. Apply a pea-sized amount around the opening of the urethra every day for 2 weeks, then three times weekly forever. - I explained that topical vaginal estrogen has multiple mechanisms of action to reduce the frequency of UTI including bulking up the urogenital tissue, increasing self-lubrication, acidifying the urogenital environment, and rebalancing the microbiome. Additionally, I explained that topical vaginal estrogen is not expected to be associated with systemic hormone absorption, and it has not been shown to increase the risk for heart attack, stroke, or other clots.  - Discussed importance of hydration. Patient recently started a new diet and intends to increase water intake.  - RTC in 2 months for follow-up and repeat UA    Marry MALVA Sara, PA-C  [facility] [address]  [address] [place], [place] [address] [phone]       [1]  Allergies Allergen Reactions   Ciprofloxacin    "

## 2024-01-13 LAB — MICROSCOPIC EXAMINATION

## 2024-01-13 LAB — URINALYSIS, COMPLETE
Bilirubin, UA: NEGATIVE
Glucose, UA: NEGATIVE
Ketones, UA: NEGATIVE
Nitrite, UA: NEGATIVE
Protein,UA: NEGATIVE
RBC, UA: NEGATIVE
Specific Gravity, UA: 1.015 (ref 1.005–1.030)
Urobilinogen, Ur: 0.2 mg/dL (ref 0.2–1.0)
pH, UA: 7 (ref 5.0–7.5)

## 2024-01-25 ENCOUNTER — Telehealth: Admitting: Family

## 2024-01-25 DIAGNOSIS — Z20828 Contact with and (suspected) exposure to other viral communicable diseases: Secondary | ICD-10-CM | POA: Diagnosis not present

## 2024-01-25 DIAGNOSIS — R6889 Other general symptoms and signs: Secondary | ICD-10-CM | POA: Diagnosis not present

## 2024-01-25 DIAGNOSIS — R051 Acute cough: Secondary | ICD-10-CM | POA: Diagnosis not present

## 2024-01-25 MED ORDER — BENZONATATE 200 MG PO CAPS: 200.0000 mg | ORAL_CAPSULE | Freq: Two times a day (BID) | ORAL | 0 refills | Status: AC | PRN

## 2024-01-25 MED ORDER — FLUTICASONE PROPIONATE 50 MCG/ACT NA SUSP: 2.0000 | Freq: Every day | NASAL | 6 refills | Status: AC

## 2024-01-25 MED ORDER — OSELTAMIVIR PHOSPHATE 75 MG PO CAPS: 75.0000 mg | ORAL_CAPSULE | Freq: Two times a day (BID) | ORAL | 0 refills | Status: AC

## 2024-01-25 NOTE — Progress Notes (Signed)
 " Virtual Visit Consent   Ann Weiss, you are scheduled for a virtual visit with a [facility] provider today. Just as with appointments in the office, your consent must be obtained to participate. Your consent will be active for this visit and any virtual visit you may have with one of our providers in the next 365 days. If you have a MyChart account, a copy of this consent can be sent to you electronically.  As this is a virtual visit, video technology does not allow for your provider to perform a traditional examination. This may limit your provider's ability to fully assess your condition. If your provider identifies any concerns that need to be evaluated in person or the need to arrange testing (such as labs, EKG, etc.), we will make arrangements to do so. Although advances in technology are sophisticated, we cannot ensure that it will always work on either your end or our end. If the connection with a video visit is poor, the visit may have to be switched to a telephone visit. With either a video or telephone visit, we are not always able to ensure that we have a secure connection.  By engaging in this virtual visit, you consent to the provision of healthcare and authorize for your insurance to be billed (if applicable) for the services provided during this visit. Depending on your insurance coverage, you may receive a charge related to this service.  I need to obtain your verbal consent now. Are you willing to proceed with your visit today? Ann Weiss has provided verbal consent on 01/25/2024 for a virtual visit (video or telephone). Bari Learn, FNP  Date: 01/25/2024 5:07 PM   Virtual Visit via Video Note   I, Bari Learn, connected with  Ann Weiss  ([id number], [birth year]) on 01/25/24 at  5:00 PM EST by a video-enabled telemedicine application and verified that I am speaking with the correct person using two identifiers.  Location: Patient: Virtual Visit Location  Patient: Home Provider: Virtual Visit Location Provider: Home Office   I discussed the limitations of evaluation and management by telemedicine and the availability of in person appointments. The patient expressed understanding and agreed to proceed.    History of Present Illness: Ann Weiss is a 74 y.o. who identifies as a female who was assigned female at birth, and is being seen today for cough that started Friday.   Reports her husband had flu a few days ago.   HPI: Cough This is a new problem. The current episode started in the past 7 days. The problem has been gradually worsening. The problem occurs constantly. The cough is Non-productive. Associated symptoms include myalgias, nasal congestion and postnasal drip. Pertinent negatives include no chills, ear congestion, ear pain, fever, headaches, sore throat, shortness of breath or wheezing. She has tried rest and OTC cough suppressant for the symptoms. The treatment provided mild relief.    Problems:  Patient Active Problem List   Diagnosis Date Noted   Establishing care with new doctor, encounter for 11/26/2023   Stage 3a chronic kidney disease (HCC) 11/26/2023   Abnormal urine odor 11/26/2023   Obesity 11/25/2023   Hypertension 11/25/2023   Anxiety 11/25/2023   Intraductal papilloma of right breast 10/18/2019   Acute ischemic left ACA stroke (HCC) 11/12/2017   GERD (gastroesophageal reflux disease) 11/23/2012   Vitamin D  deficiency 04/24/2011   Essential hypertension, benign 05/30/2010   Pure hypercholesterolemia 05/30/2010   Controlled type 2 diabetes mellitus without complication (HCC)  05/30/2010   Hypothyroidism 05/30/2010    Allergies: Allergies[1] Medications: Current Medications[2]  Observations/Objective: Patient is well-developed, well-nourished in no acute distress.  Resting comfortably  at home.  Head is normocephalic, atraumatic.  No labored breathing.  Speech is clear and coherent with logical  content.  Patient is alert and oriented at baseline.  Dry nonproductive  Assessment and Plan: 1. Flu-like symptoms - benzonatate  (TESSALON ) 200 MG capsule; Take 1 capsule (200 mg total) by mouth 2 (two) times daily as needed for cough.  Dispense: 20 capsule; Refill: 0 - fluticasone  (FLONASE ) 50 MCG/ACT nasal spray; Place 2 sprays into both nostrils daily.  Dispense: 16 g; Refill: 6 - oseltamivir  (TAMIFLU ) 75 MG capsule; Take 1 capsule (75 mg total) by mouth 2 (two) times daily.  Dispense: 10 capsule; Refill: 0  2. Exposure to influenza - oseltamivir  (TAMIFLU ) 75 MG capsule; Take 1 capsule (75 mg total) by mouth 2 (two) times daily.  Dispense: 10 capsule; Refill: 0  3. Acute cough (Primary) - oseltamivir  (TAMIFLU ) 75 MG capsule; Take 1 capsule (75 mg total) by mouth 2 (two) times daily.  Dispense: 10 capsule; Refill: 0  - Take meds as prescribed - Use a cool mist humidifier  -Use saline nose sprays frequently -Force fluids -For any cough or congestion  Use plain Mucinex- regular strength or max strength is fine -For fever or aces or pains- take tylenol  or ibuprofen. -Throat lozenges if help -Follow up if symptoms worsen or do not improve   Follow Up Instructions: I discussed the assessment and treatment plan with the patient. The patient was provided an opportunity to ask questions and all were answered. The patient agreed with the plan and demonstrated an understanding of the instructions.  A copy of instructions were sent to the patient via MyChart unless otherwise noted below.     The patient was advised to call back or seek an in-person evaluation if the symptoms worsen or if the condition fails to improve as anticipated.    Bari Learn, FNP    [1]  Allergies Allergen Reactions   Ciprofloxacin   [2]  Current Outpatient Medications:    benzonatate  (TESSALON ) 200 MG capsule, Take 1 capsule (200 mg total) by mouth 2 (two) times daily as needed for cough., Disp: 20  capsule, Rfl: 0   fluticasone  (FLONASE ) 50 MCG/ACT nasal spray, Place 2 sprays into both nostrils daily., Disp: 16 g, Rfl: 6   oseltamivir  (TAMIFLU ) 75 MG capsule, Take 1 capsule (75 mg total) by mouth 2 (two) times daily., Disp: 10 capsule, Rfl: 0   amLODipine  (NORVASC ) 10 MG tablet, Take 1 tablet (10 mg total) by mouth daily., Disp: 30 tablet, Rfl: 5   aspirin  EC 81 MG tablet, Take 81 mg by mouth as needed., Disp: , Rfl:    Bexagliflozin  (BRENZAVVY ) 20 MG TABS, Take 1 tablet by mouth in the morning. [email or web address]  Cell [phone], Disp: 30 tablet, Rfl: 2   estradiol  (ESTRACE ) 0.01 % CREA vaginal cream, Apply one pea-sized amount around the opening of the urethra daily for 2 weeks, then 3 times weekly moving forward., Disp: 42.5 g, Rfl: 12   glucose blood (ACCU-CHEK GUIDE TEST) test strip, 1 each by Other route., Disp: , Rfl:    levothyroxine  (SYNTHROID , LEVOTHROID) 75 MCG tablet, Take 75 mcg by mouth daily., Disp: , Rfl: 3   metFORMIN  (GLUCOPHAGE ) 1000 MG tablet, take 1 tablet by mouth twice a day with food, Disp: 60 tablet, Rfl: 2   omeprazole  (PRILOSEC) 20 MG capsule,  take 1 capsule by mouth once daily, Disp: 30 capsule, Rfl: 11   rosuvastatin  (CRESTOR ) 40 MG tablet, Take 1 tablet (40 mg total) by mouth daily., Disp: 90 tablet, Rfl: 2   telmisartan-hydrochlorothiazide (MICARDIS HCT) 80-25 MG tablet, Take 1 tablet by mouth daily., Disp: , Rfl: 6  "

## 2024-01-27 ENCOUNTER — Ambulatory Visit: Payer: Self-pay

## 2024-01-27 NOTE — Telephone Encounter (Signed)
 Noted.

## 2024-01-27 NOTE — Telephone Encounter (Signed)
" °  FYI Only or Action Required?: FYI only for provider: Home Care advice given.  Patient was last seen in primary care on 01/25/2024 by Lavell Bari LABOR, FNP.  Called Nurse Triage reporting Cough.  Symptoms began several days ago.  Interventions attempted: Rest, hydration, or home remedies.  Symptoms are: unchanged.  Triage Disposition: Home Care  Patient/caregiver understands and will follow disposition?: Yes      Message from Tiffini S sent at 01/27/2024  9:21 AM EST  Reason for Triage: patient have the flu with chest discomfort- hurts when coughing- soreness started a few days ag   Reason for Disposition  Cough  Answer Assessment - Initial Assessment Questions Patient called in to triage with complaints of cough, soreness in chest from cough.  Patient was advised this is normal with a constant cough. Per the visit notes, patient was advised she may take Tylenol  for the aches/pains.  The patient stated she was seen on 1/18 via video visit for same symptoms.  She agrees with plan of care, and will reach back out if symptoms worsen/persist. No further concerns at this time.  Protocols used: Cough - Acute Productive-A-AH  "

## 2024-01-30 ENCOUNTER — Encounter: Payer: Self-pay | Admitting: Pharmacist

## 2024-02-02 ENCOUNTER — Encounter: Admitting: General Practice

## 2024-02-10 ENCOUNTER — Other Ambulatory Visit: Payer: Self-pay | Admitting: General Practice

## 2024-03-01 ENCOUNTER — Encounter: Admitting: General Practice

## 2024-03-10 ENCOUNTER — Ambulatory Visit: Admitting: Urology

## 2024-03-24 ENCOUNTER — Ambulatory Visit: Admitting: Neurology

## 2024-04-07 ENCOUNTER — Ambulatory Visit: Admitting: Neurology
